# Patient Record
Sex: Female | Born: 1940 | Race: White | Hispanic: No | Marital: Single | Smoking: Former smoker
Health system: Southern US, Community
[De-identification: ages and names within clinical notes are randomized; demographics above are authoritative.]

## PROBLEM LIST (undated history)

## (undated) DIAGNOSIS — I10 Essential (primary) hypertension: Secondary | ICD-10-CM

## (undated) DIAGNOSIS — I2510 Atherosclerotic heart disease of native coronary artery without angina pectoris: Secondary | ICD-10-CM

## (undated) DIAGNOSIS — E785 Hyperlipidemia, unspecified: Secondary | ICD-10-CM

## (undated) DIAGNOSIS — J449 Chronic obstructive pulmonary disease, unspecified: Secondary | ICD-10-CM

## (undated) DIAGNOSIS — I255 Ischemic cardiomyopathy: Secondary | ICD-10-CM

## (undated) DIAGNOSIS — Z923 Personal history of irradiation: Secondary | ICD-10-CM

## (undated) DIAGNOSIS — C50919 Malignant neoplasm of unspecified site of unspecified female breast: Secondary | ICD-10-CM

## (undated) HISTORY — PX: CATARACT EXTRACTION: SUR2

## (undated) HISTORY — PX: KNEE SURGERY: SHX244

## (undated) HISTORY — DX: Atherosclerotic heart disease of native coronary artery without angina pectoris: I25.10

## (undated) HISTORY — PX: APPENDECTOMY: SHX54

## (undated) HISTORY — DX: Hyperlipidemia, unspecified: E78.5

## (undated) HISTORY — DX: Ischemic cardiomyopathy: I25.5

---

## 1998-03-05 HISTORY — PX: BREAST SURGERY: SHX581

## 2001-04-16 ENCOUNTER — Ambulatory Visit (HOSPITAL_COMMUNITY): Admission: RE | Admit: 2001-04-16 | Discharge: 2001-04-16 | Payer: Self-pay | Admitting: Internal Medicine

## 2001-04-16 ENCOUNTER — Encounter: Payer: Self-pay | Admitting: Internal Medicine

## 2001-04-23 ENCOUNTER — Encounter: Payer: Self-pay | Admitting: Internal Medicine

## 2001-04-23 ENCOUNTER — Ambulatory Visit (HOSPITAL_COMMUNITY): Admission: RE | Admit: 2001-04-23 | Discharge: 2001-04-23 | Payer: Self-pay | Admitting: Internal Medicine

## 2002-11-16 ENCOUNTER — Encounter: Payer: Self-pay | Admitting: Family Medicine

## 2002-11-16 ENCOUNTER — Ambulatory Visit (HOSPITAL_COMMUNITY): Admission: RE | Admit: 2002-11-16 | Discharge: 2002-11-16 | Payer: Self-pay | Admitting: Family Medicine

## 2003-02-24 ENCOUNTER — Ambulatory Visit (HOSPITAL_COMMUNITY): Admission: RE | Admit: 2003-02-24 | Discharge: 2003-02-24 | Payer: Self-pay | Admitting: Internal Medicine

## 2003-12-14 ENCOUNTER — Ambulatory Visit (HOSPITAL_COMMUNITY): Admission: RE | Admit: 2003-12-14 | Discharge: 2003-12-14 | Payer: Self-pay | Admitting: Family Medicine

## 2003-12-22 ENCOUNTER — Ambulatory Visit (HOSPITAL_COMMUNITY): Admission: RE | Admit: 2003-12-22 | Discharge: 2003-12-22 | Payer: Self-pay | Admitting: Family Medicine

## 2004-03-08 ENCOUNTER — Ambulatory Visit (HOSPITAL_COMMUNITY): Admission: RE | Admit: 2004-03-08 | Discharge: 2004-03-08 | Payer: Self-pay | Admitting: Family Medicine

## 2004-11-03 ENCOUNTER — Ambulatory Visit (HOSPITAL_COMMUNITY): Admission: RE | Admit: 2004-11-03 | Discharge: 2004-11-03 | Payer: Self-pay | Admitting: Otolaryngology

## 2005-05-07 ENCOUNTER — Ambulatory Visit (HOSPITAL_COMMUNITY): Admission: RE | Admit: 2005-05-07 | Discharge: 2005-05-07 | Payer: Self-pay | Admitting: Family Medicine

## 2006-05-13 ENCOUNTER — Ambulatory Visit (HOSPITAL_COMMUNITY): Admission: RE | Admit: 2006-05-13 | Discharge: 2006-05-13 | Payer: Self-pay | Admitting: Family Medicine

## 2007-08-13 ENCOUNTER — Ambulatory Visit (HOSPITAL_COMMUNITY): Admission: RE | Admit: 2007-08-13 | Discharge: 2007-08-13 | Payer: Self-pay | Admitting: Family Medicine

## 2007-08-18 ENCOUNTER — Ambulatory Visit (HOSPITAL_COMMUNITY): Admission: RE | Admit: 2007-08-18 | Discharge: 2007-08-18 | Payer: Self-pay | Admitting: Family Medicine

## 2007-08-26 ENCOUNTER — Ambulatory Visit (HOSPITAL_COMMUNITY): Admission: RE | Admit: 2007-08-26 | Discharge: 2007-08-26 | Payer: Self-pay | Admitting: Family Medicine

## 2009-09-07 ENCOUNTER — Ambulatory Visit (HOSPITAL_COMMUNITY): Admission: RE | Admit: 2009-09-07 | Discharge: 2009-09-07 | Payer: Self-pay | Admitting: Family Medicine

## 2009-10-19 ENCOUNTER — Ambulatory Visit (HOSPITAL_COMMUNITY): Admission: RE | Admit: 2009-10-19 | Discharge: 2009-10-19 | Payer: Self-pay | Admitting: Family Medicine

## 2010-03-25 ENCOUNTER — Encounter: Payer: Self-pay | Admitting: Internal Medicine

## 2010-08-23 ENCOUNTER — Other Ambulatory Visit (HOSPITAL_COMMUNITY): Payer: Self-pay | Admitting: Family Medicine

## 2010-08-23 DIAGNOSIS — Z1231 Encounter for screening mammogram for malignant neoplasm of breast: Secondary | ICD-10-CM

## 2010-09-11 ENCOUNTER — Ambulatory Visit (HOSPITAL_COMMUNITY)
Admission: RE | Admit: 2010-09-11 | Discharge: 2010-09-11 | Disposition: A | Discharge status: Home / Self Care | Payer: Medicare Other | Source: Ambulatory Visit | Attending: Family Medicine | Admitting: Family Medicine

## 2010-09-11 DIAGNOSIS — Z1231 Encounter for screening mammogram for malignant neoplasm of breast: Secondary | ICD-10-CM

## 2011-08-31 ENCOUNTER — Other Ambulatory Visit (HOSPITAL_COMMUNITY): Payer: Self-pay | Admitting: Family Medicine

## 2011-08-31 DIAGNOSIS — Z1231 Encounter for screening mammogram for malignant neoplasm of breast: Secondary | ICD-10-CM

## 2011-10-02 ENCOUNTER — Ambulatory Visit (HOSPITAL_COMMUNITY): Payer: Medicare Other

## 2011-10-02 ENCOUNTER — Ambulatory Visit (HOSPITAL_COMMUNITY)
Admission: RE | Admit: 2011-10-02 | Discharge: 2011-10-02 | Disposition: A | Discharge status: Home / Self Care | Payer: Medicare Other | Source: Ambulatory Visit | Attending: Family Medicine | Admitting: Family Medicine

## 2011-10-02 DIAGNOSIS — Z1231 Encounter for screening mammogram for malignant neoplasm of breast: Secondary | ICD-10-CM | POA: Insufficient documentation

## 2012-08-29 ENCOUNTER — Other Ambulatory Visit (HOSPITAL_COMMUNITY): Payer: Self-pay | Admitting: Family Medicine

## 2012-08-29 DIAGNOSIS — Z1231 Encounter for screening mammogram for malignant neoplasm of breast: Secondary | ICD-10-CM

## 2012-10-02 ENCOUNTER — Ambulatory Visit (HOSPITAL_COMMUNITY)
Admission: RE | Admit: 2012-10-02 | Discharge: 2012-10-02 | Disposition: A | Discharge status: Home / Self Care | Payer: Medicare Other | Source: Ambulatory Visit | Attending: Family Medicine | Admitting: Family Medicine

## 2012-10-02 DIAGNOSIS — Z1231 Encounter for screening mammogram for malignant neoplasm of breast: Secondary | ICD-10-CM | POA: Insufficient documentation

## 2013-08-28 ENCOUNTER — Other Ambulatory Visit (HOSPITAL_COMMUNITY): Payer: Self-pay | Admitting: Family Medicine

## 2013-08-28 DIAGNOSIS — Z1231 Encounter for screening mammogram for malignant neoplasm of breast: Secondary | ICD-10-CM

## 2013-10-05 ENCOUNTER — Ambulatory Visit (HOSPITAL_COMMUNITY)
Admission: RE | Admit: 2013-10-05 | Discharge: 2013-10-05 | Disposition: A | Discharge status: Home / Self Care | Payer: Medicare HMO | Source: Ambulatory Visit | Attending: Family Medicine | Admitting: Family Medicine

## 2013-10-05 DIAGNOSIS — Z1231 Encounter for screening mammogram for malignant neoplasm of breast: Secondary | ICD-10-CM | POA: Diagnosis present

## 2014-09-07 ENCOUNTER — Other Ambulatory Visit (HOSPITAL_COMMUNITY): Payer: Self-pay | Admitting: Family Medicine

## 2014-09-07 DIAGNOSIS — Z1231 Encounter for screening mammogram for malignant neoplasm of breast: Secondary | ICD-10-CM

## 2014-10-11 ENCOUNTER — Ambulatory Visit (HOSPITAL_COMMUNITY)
Admission: RE | Admit: 2014-10-11 | Discharge: 2014-10-11 | Disposition: A | Discharge status: Home / Self Care | Payer: Medicare PPO | Source: Ambulatory Visit | Attending: Family Medicine | Admitting: Family Medicine

## 2014-10-11 DIAGNOSIS — Z1231 Encounter for screening mammogram for malignant neoplasm of breast: Secondary | ICD-10-CM | POA: Diagnosis present

## 2015-09-14 ENCOUNTER — Other Ambulatory Visit: Payer: Self-pay | Admitting: Family Medicine

## 2015-09-14 DIAGNOSIS — Z1231 Encounter for screening mammogram for malignant neoplasm of breast: Secondary | ICD-10-CM

## 2015-10-13 ENCOUNTER — Ambulatory Visit
Admission: RE | Admit: 2015-10-13 | Discharge: 2015-10-13 | Disposition: A | Discharge status: Home / Self Care | Payer: Medicare PPO | Source: Ambulatory Visit | Attending: Family Medicine | Admitting: Family Medicine

## 2015-10-13 DIAGNOSIS — Z1231 Encounter for screening mammogram for malignant neoplasm of breast: Secondary | ICD-10-CM

## 2016-02-28 ENCOUNTER — Emergency Department (HOSPITAL_COMMUNITY): Payer: Medicare PPO

## 2016-02-28 ENCOUNTER — Encounter (HOSPITAL_COMMUNITY): Payer: Self-pay | Admitting: Emergency Medicine

## 2016-02-28 ENCOUNTER — Inpatient Hospital Stay (HOSPITAL_COMMUNITY)
Admission: EM | Admit: 2016-02-28 | Discharge: 2016-03-05 | DRG: 280 | Disposition: A | Discharge status: Home Health | Payer: Medicare PPO | Attending: Internal Medicine | Admitting: Internal Medicine

## 2016-02-28 DIAGNOSIS — I214 Non-ST elevation (NSTEMI) myocardial infarction: Secondary | ICD-10-CM | POA: Diagnosis present

## 2016-02-28 DIAGNOSIS — I248 Other forms of acute ischemic heart disease: Secondary | ICD-10-CM | POA: Diagnosis present

## 2016-02-28 DIAGNOSIS — I2510 Atherosclerotic heart disease of native coronary artery without angina pectoris: Secondary | ICD-10-CM

## 2016-02-28 DIAGNOSIS — J449 Chronic obstructive pulmonary disease, unspecified: Secondary | ICD-10-CM | POA: Diagnosis present

## 2016-02-28 DIAGNOSIS — I255 Ischemic cardiomyopathy: Secondary | ICD-10-CM | POA: Diagnosis present

## 2016-02-28 DIAGNOSIS — I252 Old myocardial infarction: Secondary | ICD-10-CM

## 2016-02-28 DIAGNOSIS — I5021 Acute systolic (congestive) heart failure: Secondary | ICD-10-CM | POA: Diagnosis not present

## 2016-02-28 DIAGNOSIS — R0902 Hypoxemia: Secondary | ICD-10-CM | POA: Diagnosis present

## 2016-02-28 DIAGNOSIS — R739 Hyperglycemia, unspecified: Secondary | ICD-10-CM | POA: Diagnosis present

## 2016-02-28 DIAGNOSIS — R748 Abnormal levels of other serum enzymes: Secondary | ICD-10-CM | POA: Diagnosis present

## 2016-02-28 DIAGNOSIS — I110 Hypertensive heart disease with heart failure: Secondary | ICD-10-CM | POA: Diagnosis present

## 2016-02-28 DIAGNOSIS — E7800 Pure hypercholesterolemia, unspecified: Secondary | ICD-10-CM | POA: Diagnosis present

## 2016-02-28 DIAGNOSIS — Z7902 Long term (current) use of antithrombotics/antiplatelets: Secondary | ICD-10-CM

## 2016-02-28 DIAGNOSIS — Z79899 Other long term (current) drug therapy: Secondary | ICD-10-CM | POA: Diagnosis not present

## 2016-02-28 DIAGNOSIS — R0602 Shortness of breath: Secondary | ICD-10-CM

## 2016-02-28 DIAGNOSIS — Z853 Personal history of malignant neoplasm of breast: Secondary | ICD-10-CM

## 2016-02-28 DIAGNOSIS — I5023 Acute on chronic systolic (congestive) heart failure: Secondary | ICD-10-CM | POA: Diagnosis present

## 2016-02-28 DIAGNOSIS — I10 Essential (primary) hypertension: Secondary | ICD-10-CM | POA: Diagnosis not present

## 2016-02-28 DIAGNOSIS — R778 Other specified abnormalities of plasma proteins: Secondary | ICD-10-CM

## 2016-02-28 DIAGNOSIS — R9439 Abnormal result of other cardiovascular function study: Secondary | ICD-10-CM | POA: Diagnosis not present

## 2016-02-28 DIAGNOSIS — Z7982 Long term (current) use of aspirin: Secondary | ICD-10-CM

## 2016-02-28 DIAGNOSIS — Z87891 Personal history of nicotine dependence: Secondary | ICD-10-CM

## 2016-02-28 DIAGNOSIS — Z8249 Family history of ischemic heart disease and other diseases of the circulatory system: Secondary | ICD-10-CM | POA: Diagnosis not present

## 2016-02-28 DIAGNOSIS — Z882 Allergy status to sulfonamides status: Secondary | ICD-10-CM

## 2016-02-28 DIAGNOSIS — J441 Chronic obstructive pulmonary disease with (acute) exacerbation: Secondary | ICD-10-CM

## 2016-02-28 DIAGNOSIS — R0600 Dyspnea, unspecified: Secondary | ICD-10-CM | POA: Diagnosis not present

## 2016-02-28 DIAGNOSIS — R7989 Other specified abnormal findings of blood chemistry: Secondary | ICD-10-CM

## 2016-02-28 HISTORY — DX: Chronic obstructive pulmonary disease, unspecified: J44.9

## 2016-02-28 HISTORY — DX: Atherosclerotic heart disease of native coronary artery without angina pectoris: I25.10

## 2016-02-28 HISTORY — DX: Malignant neoplasm of unspecified site of unspecified female breast: C50.919

## 2016-02-28 HISTORY — DX: Essential (primary) hypertension: I10

## 2016-02-28 LAB — CBC
HCT: 40 % (ref 36.0–46.0)
Hemoglobin: 13.4 g/dL (ref 12.0–15.0)
MCH: 33.1 pg (ref 26.0–34.0)
MCHC: 33.5 g/dL (ref 30.0–36.0)
MCV: 98.8 fL (ref 78.0–100.0)
PLATELETS: 177 10*3/uL (ref 150–400)
RBC: 4.05 MIL/uL (ref 3.87–5.11)
RDW: 14.3 % (ref 11.5–15.5)
WBC: 9.1 10*3/uL (ref 4.0–10.5)

## 2016-02-28 LAB — BASIC METABOLIC PANEL
Anion gap: 10 (ref 5–15)
BUN: 16 mg/dL (ref 6–20)
CALCIUM: 9.6 mg/dL (ref 8.9–10.3)
CO2: 22 mmol/L (ref 22–32)
CREATININE: 0.85 mg/dL (ref 0.44–1.00)
Chloride: 102 mmol/L (ref 101–111)
GLUCOSE: 119 mg/dL — AB (ref 65–99)
Potassium: 4.4 mmol/L (ref 3.5–5.1)
Sodium: 134 mmol/L — ABNORMAL LOW (ref 135–145)

## 2016-02-28 LAB — APTT: aPTT: 88 seconds — ABNORMAL HIGH (ref 24–36)

## 2016-02-28 LAB — TROPONIN I
TROPONIN I: 0.05 ng/mL — AB (ref <0.03)
Troponin I: 0.1 ng/mL (ref <0.03)
Troponin I: 0.14 ng/mL (ref <0.03)

## 2016-02-28 LAB — BRAIN NATRIURETIC PEPTIDE: B Natriuretic Peptide: 797 pg/mL — ABNORMAL HIGH (ref 0.0–100.0)

## 2016-02-28 MED ORDER — SODIUM CHLORIDE 0.9% FLUSH
3.0000 mL | Freq: Two times a day (BID) | INTRAVENOUS | Status: DC
  Administered 2016-02-29 – 2016-03-02 (×3): 3 mL via INTRAVENOUS

## 2016-02-28 MED ORDER — ALBUTEROL SULFATE (2.5 MG/3ML) 0.083% IN NEBU
2.5000 mg | INHALATION_SOLUTION | Freq: Four times a day (QID) | RESPIRATORY_TRACT | Status: DC
  Administered 2016-02-29 (×2): 2.5 mg via RESPIRATORY_TRACT
  Filled 2016-02-28 (×2): qty 3

## 2016-02-28 MED ORDER — HEPARIN SODIUM (PORCINE) 5000 UNIT/ML IJ SOLN
4000.0000 [IU] | Freq: Once | INTRAMUSCULAR | Status: AC
  Administered 2016-02-28: 4000 [IU] via INTRAVENOUS

## 2016-02-28 MED ORDER — TIOTROPIUM BROMIDE MONOHYDRATE 18 MCG IN CAPS
18.0000 ug | ORAL_CAPSULE | Freq: Every day | RESPIRATORY_TRACT | Status: DC
  Administered 2016-03-02 – 2016-03-05 (×4): 18 ug via RESPIRATORY_TRACT
  Filled 2016-02-28 (×2): qty 5

## 2016-02-28 MED ORDER — HYDRALAZINE HCL 20 MG/ML IJ SOLN: 5.0000 mg | Freq: Four times a day (QID) | INTRAMUSCULAR | Status: DC | PRN

## 2016-02-28 MED ORDER — HEPARIN (PORCINE) IN NACL 100-0.45 UNIT/ML-% IJ SOLN: 14.0000 [IU]/kg/h | Freq: Once | INTRAMUSCULAR | Status: DC

## 2016-02-28 MED ORDER — SODIUM CHLORIDE 0.9% FLUSH
3.0000 mL | Freq: Two times a day (BID) | INTRAVENOUS | Status: DC
  Administered 2016-02-29 – 2016-03-01 (×3): 3 mL via INTRAVENOUS

## 2016-02-28 MED ORDER — SODIUM CHLORIDE 0.9 % IV SOLN: 250.0000 mL | INTRAVENOUS | Status: DC | PRN

## 2016-02-28 MED ORDER — METHYLPREDNISOLONE SODIUM SUCC 125 MG IJ SOLR
125.0000 mg | Freq: Once | INTRAMUSCULAR | Status: AC
  Administered 2016-02-28: 125 mg via INTRAVENOUS
  Filled 2016-02-28: qty 2

## 2016-02-28 MED ORDER — ALBUTEROL SULFATE (2.5 MG/3ML) 0.083% IN NEBU: 2.5000 mg | INHALATION_SOLUTION | Freq: Four times a day (QID) | RESPIRATORY_TRACT | Status: DC | PRN

## 2016-02-28 MED ORDER — METHYLPREDNISOLONE SODIUM SUCC 125 MG IJ SOLR
80.0000 mg | Freq: Three times a day (TID) | INTRAMUSCULAR | Status: DC
  Administered 2016-02-29 – 2016-03-01 (×4): 80 mg via INTRAVENOUS
  Filled 2016-02-28 (×4): qty 2

## 2016-02-28 MED ORDER — FLUTICASONE FUROATE-VILANTEROL 200-25 MCG/INH IN AEPB
1.0000 | INHALATION_SPRAY | Freq: Every day | RESPIRATORY_TRACT | Status: DC
  Administered 2016-03-02 – 2016-03-05 (×4): 1 via RESPIRATORY_TRACT
  Filled 2016-02-28: qty 28

## 2016-02-28 MED ORDER — SODIUM CHLORIDE 0.9% FLUSH: 3.0000 mL | INTRAVENOUS | Status: DC | PRN

## 2016-02-28 MED ORDER — AMLODIPINE BESYLATE 10 MG PO TABS
10.0000 mg | ORAL_TABLET | Freq: Every day | ORAL | Status: DC
  Administered 2016-02-29 – 2016-03-02 (×3): 10 mg via ORAL
  Filled 2016-02-28 (×3): qty 1

## 2016-02-28 MED ORDER — ATORVASTATIN CALCIUM 80 MG PO TABS
80.0000 mg | ORAL_TABLET | Freq: Every day | ORAL | Status: DC
  Administered 2016-02-29 – 2016-03-02 (×2): 80 mg via ORAL
  Filled 2016-02-28 (×4): qty 1

## 2016-02-28 MED ORDER — ACETAMINOPHEN 325 MG PO TABS: 650.0000 mg | ORAL_TABLET | Freq: Four times a day (QID) | ORAL | Status: DC | PRN

## 2016-02-28 MED ORDER — IPRATROPIUM-ALBUTEROL 0.5-2.5 (3) MG/3ML IN SOLN
3.0000 mL | Freq: Once | RESPIRATORY_TRACT | Status: AC
  Administered 2016-02-28: 3 mL via RESPIRATORY_TRACT
  Filled 2016-02-28: qty 3

## 2016-02-28 MED ORDER — HEPARIN (PORCINE) IN NACL 100-0.45 UNIT/ML-% IJ SOLN
1050.0000 [IU]/h | INTRAMUSCULAR | Status: AC
  Administered 2016-02-28: 1050 [IU]/h via INTRAVENOUS
  Filled 2016-02-28: qty 250

## 2016-02-28 MED ORDER — LEVOFLOXACIN IN D5W 500 MG/100ML IV SOLN
500.0000 mg | Freq: Every day | INTRAVENOUS | Status: DC
  Administered 2016-02-29 (×2): 500 mg via INTRAVENOUS
  Filled 2016-02-28 (×2): qty 100

## 2016-02-28 MED ORDER — ASPIRIN EC 325 MG PO TBEC
325.0000 mg | DELAYED_RELEASE_TABLET | Freq: Every day | ORAL | Status: DC
  Administered 2016-02-29: 325 mg via ORAL
  Filled 2016-02-28: qty 1

## 2016-02-28 MED ORDER — IOPAMIDOL (ISOVUE-370) INJECTION 76%
100.0000 mL | Freq: Once | INTRAVENOUS | Status: AC | PRN
  Administered 2016-02-28: 100 mL via INTRAVENOUS

## 2016-02-28 MED ORDER — ACETAMINOPHEN 650 MG RE SUPP: 650.0000 mg | Freq: Four times a day (QID) | RECTAL | Status: DC | PRN

## 2016-02-28 MED ORDER — FUROSEMIDE 10 MG/ML IJ SOLN
40.0000 mg | Freq: Once | INTRAMUSCULAR | Status: AC
  Administered 2016-02-28: 40 mg via INTRAVENOUS
  Filled 2016-02-28: qty 4
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
## 2016-02-28 NOTE — ED Notes (Signed)
Pt taken to Xray.

## 2016-02-28 NOTE — ED Notes (Signed)
CRITICAL VALUE ALERT  Critical value received:  Troponin 0.05  Date of notification:  02/28/16  Time of notification:  1330  Critical value read back:Yes.    Nurse who received alert:  RMinter, RN  MD notified (1st page):  Dr. Alvino Chapel  Time of first page:  1330  MD notified (2nd page):  Time of second page:  Responding MD:  Dr. Alvino Chapel  Time MD responded:  1330
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
## 2016-02-28 NOTE — ED Provider Notes (Signed)
Patient turned over to me from daytime emergency physician Dr. Alvino Chapel. Patient ran into further respiratory distress. Was having trouble with breathing was on 100% nonrebreather patient was maintaining her sats but she had bilateral wheezing. Chest x-ray raises some concerns for pulmonary edema CT angiogram of the chest negative for pulmonary embolus. Has some pleural effusions but no significant edema. Patient BNP slightly elevated. Patient's initial troponin abnormal at 0.05. Repeat troponin was higher at 0.1. Discussed with cardiology was concerned for possible STEMI they said unless it arises above 0.1 may not be but agreed with starting heparin. Patient started on BiPAP for the respiratory distress received nebulizer gets IM Medrol improvement in the breathing. Patient also given IV Lasix 40 mg. Discussed with hospitalist. Patient will be transferred to hospital service a cone. This may just be an exacerbation of COPD. Or possibly a non-STEMI. Patient BNP was elevated [place] 797 nothing to compare it to.  CRITICAL CARE Performed by: Fredia Sorrow Total critical care time: 45 minutes Critical care time was exclusive of separately billable procedures and treating other patients. Critical care was necessary to treat or prevent imminent or life-threatening deterioration. Critical care was time spent personally by me on the following activities: development of treatment plan with patient and/or surrogate as well as nursing, discussions with consultants, evaluation of patient's response to treatment, examination of patient, obtaining history from patient or surrogate, ordering and performing treatments and interventions, ordering and review of laboratory studies, ordering and review of radiographic studies, pulse oximetry and re-evaluation of patient's condition.    Fredia Sorrow, MD 02/28/16 [phone]

## 2016-02-28 NOTE — Progress Notes (Signed)
ANTICOAGULATION CONSULT NOTE - Initial Consult  Pharmacy Consult for heparin Indication: chest pain/ACS  Allergies  Allergen Reactions  . Sulfa Antibiotics Swelling    Patient Measurements: Height: 5\' 10"  (177.8 cm) Weight: 166 lb (75.3 kg) IBW/kg (Calculated) : 68.5 Heparin Dosing Weight: 75.3 kg  Vital Signs: Temp: 98.1 F (36.7 C) (12/26 1225) Temp Source: Oral (12/26 1225) BP: 150/76 (12/26 1800) Pulse Rate: 92 (12/26 1800)  Labs:  Recent Labs  02/28/16 1243 02/28/16 1247 02/28/16 1543  HGB 13.4  --   --   HCT 40.0  --   --   PLT 177  --   --   CREATININE 0.85  --   --   TROPONINI  --  0.05* 0.10*    Estimated Creatinine Clearance: 62.8 mL/min (by C-G formula based on SCr of 0.85 mg/dL).   Medical History: Past Medical History:  Diagnosis Date  . COPD (chronic obstructive pulmonary disease) ([place])   . Hypercholesteremia   . Hypertension     Medications:  See medication history  Assessment: 75 yo lady to start heparin for CP, r/o ACS.  She was not on anticoagulation PTA.  Heparin bolus 4000 units and drip at 1050 units/hr ordered in the ED Goal of Therapy:  Heparin level 0.3-0.7 units/ml Monitor platelets by anticoagulation protocol: Yes   Plan:  Continue heparin drip at 1050 units/hr Check heparin level and CBC 6 hours after start and daily while on heparin Monitor for bleeding complications  Excell Seltzer Poteet 02/28/2016,6:04 PM

## 2016-02-29 ENCOUNTER — Other Ambulatory Visit (HOSPITAL_COMMUNITY): Payer: Medicare PPO

## 2016-02-29 DIAGNOSIS — R748 Abnormal levels of other serum enzymes: Secondary | ICD-10-CM

## 2016-02-29 LAB — COMPREHENSIVE METABOLIC PANEL
ALBUMIN: 3.5 g/dL (ref 3.5–5.0)
ALT: 26 U/L (ref 14–54)
AST: 30 U/L (ref 15–41)
Alkaline Phosphatase: 55 U/L (ref 38–126)
Anion gap: 15 (ref 5–15)
BUN: 13 mg/dL (ref 6–20)
CHLORIDE: 103 mmol/L (ref 101–111)
CO2: 20 mmol/L — AB (ref 22–32)
CREATININE: 1 mg/dL (ref 0.44–1.00)
Calcium: 9.6 mg/dL (ref 8.9–10.3)
GFR calc Af Amer: >60 mL/min (ref >60)
GFR, EST NON AFRICAN AMERICAN: 54 mL/min — AB (ref >60)
GLUCOSE: 165 mg/dL — AB (ref 65–99)
POTASSIUM: 3.7 mmol/L (ref 3.5–5.1)
Sodium: 138 mmol/L (ref 135–145)
Total Bilirubin: 0.9 mg/dL (ref 0.3–1.2)
Total Protein: 7.3 g/dL (ref 6.5–8.1)

## 2016-02-29 LAB — MRSA PCR SCREENING: MRSA by PCR: NEGATIVE

## 2016-02-29 LAB — TROPONIN I
TROPONIN I: 0.07 ng/mL — AB (ref <0.03)
Troponin I: 0.1 ng/mL (ref <0.03)
Troponin I: 0.06 ng/mL (ref <0.03)

## 2016-02-29 LAB — CBC
HEMATOCRIT: 36.1 % (ref 36.0–46.0)
Hemoglobin: 12.1 g/dL (ref 12.0–15.0)
MCH: 32 pg (ref 26.0–34.0)
MCHC: 33.5 g/dL (ref 30.0–36.0)
MCV: 95.5 fL (ref 78.0–100.0)
PLATELETS: 186 10*3/uL (ref 150–400)
RBC: 3.78 MIL/uL — ABNORMAL LOW (ref 3.87–5.11)
RDW: 14.6 % (ref 11.5–15.5)
WBC: 8.2 10*3/uL (ref 4.0–10.5)

## 2016-02-29 LAB — TSH: TSH: 1.138 u[IU]/mL (ref 0.350–4.500)

## 2016-02-29 LAB — HEPARIN LEVEL (UNFRACTIONATED)
HEPARIN UNFRACTIONATED: 0.35 [IU]/mL (ref 0.30–0.70)
Heparin Unfractionated: 0.35 IU/mL (ref 0.30–0.70)

## 2016-02-29 MED ORDER — HEPARIN (PORCINE) IN NACL 100-0.45 UNIT/ML-% IJ SOLN
1050.0000 [IU]/h | INTRAMUSCULAR | Status: DC
Start: 2016-02-29 — End: 2016-02-29

## 2016-02-29 MED ORDER — HEPARIN (PORCINE) IN NACL 100-0.45 UNIT/ML-% IJ SOLN: 1050.0000 [IU]/h | INTRAMUSCULAR | Status: DC

## 2016-02-29 NOTE — Plan of Care (Signed)
Problem: Phase I Progression Outcomes Goal: O2 sats > or equal 90% or at baseline Outcome: Completed/Met Date Met: 02/29/16 Bipap removed per RT pt O2 sats high 90's on RA   
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
## 2016-02-29 NOTE — Plan of Care (Signed)
Problem: Phase I Progression Outcomes Goal: Discharge plan established Outcome: Completed/Met Date Met: 02/29/16 Pt independent prior to admit and plans to discharge back to home

## 2016-02-29 NOTE — Progress Notes (Signed)
ANTICOAGULATION CONSULT NOTE - Follow-up Consult  Pharmacy Consult for heparin Indication: chest pain/ACS  Allergies  Allergen Reactions  . Sulfa Antibiotics Swelling    Patient Measurements: Height: 5\' 10"  (177.8 cm) Weight: 169 lb 3.2 oz (76.7 kg) IBW/kg (Calculated) : 68.5 Heparin Dosing Weight: 75.3 kg  Vital Signs: Temp: 98 F (36.7 C) (12/27 1100) Temp Source: Oral (12/27 1100) BP: 123/72 (12/27 1100) Pulse Rate: 93 (12/27 1100)  Labs:  Recent Labs  02/28/16 1243  02/28/16 1943 02/28/16 2319 02/29/16 0501 02/29/16 0523 02/29/16 1301  HGB 13.4  --   --   --  12.1  --   --   HCT 40.0  --   --   --  36.1  --   --   PLT 177  --   --   --  186  --   --   APTT  --   --  88*  --   --   --   --   HEPARINUNFRC  --   --   --   --   --  0.35 0.35  CREATININE 0.85  --   --   --  1.00  --   --   TROPONINI  --   < > 0.14* 0.10* 0.07*  --  0.06*  < > = values in this interval not displayed.  Estimated Creatinine Clearance: 53.4 mL/min (by C-G formula based on SCr of 1 mg/dL).  Assessment: 75 yo F to on heparin for r/o ACS. Heparin level remains therapeutic (0.35) at 1050 units/hr.  Goal of Therapy:  Heparin level 0.3-0.7 units/ml Monitor platelets by anticoagulation protocol: Yes   Plan:  Continue heparin drip at 1050 units/hr Daily heparin level and CBC  Hildred Laser, Pharm D 02/29/2016 2:57 PM

## 2016-02-29 NOTE — Progress Notes (Signed)
Spoke with Kelly Benjamin in pharmacy regarding pt being transferred from [facility] and on Heparin drip. Heparin to be dosed by pharmacy. Jessie Foot, RN

## 2016-02-29 NOTE — Progress Notes (Signed)
Pt removed from bipap at this time and being monitored per RT and RN.  No O2 added at this tiime.  Sats 99% on RA amd BS are clear/diminished.  Rt will continue to monitor.
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
## 2016-02-29 NOTE — Progress Notes (Signed)
Pt remains off Bipap on room air. Tolerating well/ O2 sats mid to high 90's, no SOB or distress noted. Pt currently resting quietly. Will continue to monitor.  Jessie Foot, RN

## 2016-02-29 NOTE — Progress Notes (Signed)
ANTICOAGULATION CONSULT NOTE - Follow-up Consult  Pharmacy Consult for heparin Indication: chest pain/ACS  Allergies  Allergen Reactions  . Sulfa Antibiotics Swelling    Patient Measurements: Height: 5\' 10"  (177.8 cm) Weight: 169 lb 3.2 oz (76.7 kg) IBW/kg (Calculated) : 68.5 Heparin Dosing Weight: 75.3 kg  Vital Signs: Temp: 98.7 F (37.1 C) (12/27 0452) Temp Source: Oral (12/27 0452) BP: 131/78 (12/27 0452) Pulse Rate: 85 (12/27 0452)  Labs:  Recent Labs  02/28/16 1243  02/28/16 1943 02/28/16 2319 02/29/16 0501 02/29/16 0523  HGB 13.4  --   --   --  12.1  --   HCT 40.0  --   --   --  36.1  --   PLT 177  --   --   --  186  --   APTT  --   --  88*  --   --   --   HEPARINUNFRC  --   --   --   --   --  0.35  CREATININE 0.85  --   --   --  1.00  --   TROPONINI  --   < > 0.14* 0.10* 0.07*  --   < > = values in this interval not displayed.  Estimated Creatinine Clearance: 53.4 mL/min (by C-G formula based on SCr of 1 mg/dL).  Assessment: 75 yo F to on heparin for r/o ACS. Heparin level therapeutic (0.35) on gtt at 1050 units/hr. No bleeding noted. CBC stable.  Goal of Therapy:  Heparin level 0.3-0.7 units/ml Monitor platelets by anticoagulation protocol: Yes   Plan:  Continue heparin drip at 1050 units/hr Will f/u 6hr confirmatory heparin level  Sherlon Handing, PharmD, BCPS Clinical pharmacist, pager [phone] 02/29/2016,7:00 AM

## 2016-03-01 ENCOUNTER — Inpatient Hospital Stay (HOSPITAL_COMMUNITY): Payer: Medicare PPO

## 2016-03-01 DIAGNOSIS — I214 Non-ST elevation (NSTEMI) myocardial infarction: Principal | ICD-10-CM

## 2016-03-01 DIAGNOSIS — R748 Abnormal levels of other serum enzymes: Secondary | ICD-10-CM

## 2016-03-01 DIAGNOSIS — R9439 Abnormal result of other cardiovascular function study: Secondary | ICD-10-CM

## 2016-03-01 DIAGNOSIS — R0602 Shortness of breath: Secondary | ICD-10-CM

## 2016-03-01 DIAGNOSIS — I255 Ischemic cardiomyopathy: Secondary | ICD-10-CM

## 2016-03-01 DIAGNOSIS — I252 Old myocardial infarction: Secondary | ICD-10-CM

## 2016-03-01 DIAGNOSIS — R0600 Dyspnea, unspecified: Secondary | ICD-10-CM

## 2016-03-01 LAB — BASIC METABOLIC PANEL
Anion gap: 9 (ref 5–15)
BUN: 26 mg/dL — AB (ref 6–20)
CO2: 23 mmol/L (ref 22–32)
CREATININE: 1.04 mg/dL — AB (ref 0.44–1.00)
Calcium: 9.7 mg/dL (ref 8.9–10.3)
Chloride: 102 mmol/L (ref 101–111)
GFR calc Af Amer: 60 mL/min — ABNORMAL LOW (ref >60)
GFR, EST NON AFRICAN AMERICAN: 52 mL/min — AB (ref >60)
GLUCOSE: 153 mg/dL — AB (ref 65–99)
Potassium: 4.2 mmol/L (ref 3.5–5.1)
Sodium: 134 mmol/L — ABNORMAL LOW (ref 135–145)

## 2016-03-01 LAB — ECHOCARDIOGRAM COMPLETE
Height: 70 in
Weight: 2697.6 oz

## 2016-03-01 LAB — NM MYOCAR MULTI W/SPECT W/WALL MOTION / EF
CSEPED: 0 min
CSEPEDS: 0 s
CSEPEW: 1 METS
CSEPHR: 72 %
CSEPPHR: 106 {beats}/min
MPHR: 146 {beats}/min
Rest HR: 84 {beats}/min

## 2016-03-01 LAB — HEMOGLOBIN A1C
HEMOGLOBIN A1C: 5.4 % (ref 4.8–5.6)
Mean Plasma Glucose: 108 mg/dL

## 2016-03-01 LAB — CBC
HCT: 35.5 % — ABNORMAL LOW (ref 36.0–46.0)
Hemoglobin: 11.8 g/dL — ABNORMAL LOW (ref 12.0–15.0)
MCH: 32.2 pg (ref 26.0–34.0)
MCHC: 33.2 g/dL (ref 30.0–36.0)
MCV: 97 fL (ref 78.0–100.0)
Platelets: 184 10*3/uL (ref 150–400)
RBC: 3.66 MIL/uL — AB (ref 3.87–5.11)
RDW: 15 % (ref 11.5–15.5)
WBC: 16.2 10*3/uL — ABNORMAL HIGH (ref 4.0–10.5)

## 2016-03-01 MED ORDER — REGADENOSON 0.4 MG/5ML IV SOLN
INTRAVENOUS | Status: AC
  Administered 2016-03-01: 0.4 mg via INTRAVENOUS
  Filled 2016-03-01: qty 5

## 2016-03-01 MED ORDER — SODIUM CHLORIDE 0.9 % IV SOLN
INTRAVENOUS | Status: DC
  Administered 2016-03-02: 05:00:00 via INTRAVENOUS

## 2016-03-01 MED ORDER — SODIUM CHLORIDE 0.9 % IV SOLN: 250.0000 mL | INTRAVENOUS | Status: DC | PRN

## 2016-03-01 MED ORDER — METHYLPREDNISOLONE SODIUM SUCC 40 MG IJ SOLR
40.0000 mg | Freq: Three times a day (TID) | INTRAMUSCULAR | Status: DC
  Administered 2016-03-01: 40 mg via INTRAVENOUS

## 2016-03-01 MED ORDER — SODIUM CHLORIDE 0.9% FLUSH: 3.0000 mL | INTRAVENOUS | Status: DC | PRN

## 2016-03-01 MED ORDER — TECHNETIUM TC 99M TETROFOSMIN IV KIT
30.0000 | PACK | Freq: Once | INTRAVENOUS | Status: AC | PRN
Start: 2016-03-01 — End: 2016-03-01
  Administered 2016-03-01: 30 via INTRAVENOUS

## 2016-03-01 MED ORDER — LEVOFLOXACIN 500 MG PO TABS
500.0000 mg | ORAL_TABLET | Freq: Every day | ORAL | Status: DC
  Administered 2016-03-01 – 2016-03-04 (×4): 500 mg via ORAL
  Filled 2016-03-01 (×4): qty 1

## 2016-03-01 MED ORDER — HEPARIN SODIUM (PORCINE) 5000 UNIT/ML IJ SOLN
5000.0000 [IU] | Freq: Three times a day (TID) | INTRAMUSCULAR | Status: DC
  Administered 2016-03-01 (×2): 5000 [IU] via SUBCUTANEOUS
  Filled 2016-03-01 (×2): qty 1

## 2016-03-01 MED ORDER — PREDNISONE 20 MG PO TABS
30.0000 mg | ORAL_TABLET | Freq: Every day | ORAL | Status: DC
  Administered 2016-03-02 – 2016-03-05 (×4): 30 mg via ORAL
  Filled 2016-03-01 (×4): qty 1

## 2016-03-01 MED ORDER — SODIUM CHLORIDE 0.9% FLUSH
3.0000 mL | Freq: Two times a day (BID) | INTRAVENOUS | Status: DC
  Administered 2016-03-01: 3 mL via INTRAVENOUS

## 2016-03-01 MED ORDER — TECHNETIUM TC 99M TETROFOSMIN IV KIT
10.0000 | PACK | Freq: Once | INTRAVENOUS | Status: AC | PRN
Start: 2016-03-01 — End: 2016-03-01
  Administered 2016-03-01: 10 via INTRAVENOUS

## 2016-03-01 MED ORDER — ASPIRIN EC 81 MG PO TBEC
81.0000 mg | DELAYED_RELEASE_TABLET | Freq: Every day | ORAL | Status: DC
  Administered 2016-03-01: 81 mg via ORAL
  Filled 2016-03-01: qty 1

## 2016-03-01 MED ORDER — ASPIRIN 81 MG PO CHEW
81.0000 mg | CHEWABLE_TABLET | ORAL | Status: AC
  Administered 2016-03-02: 81 mg via ORAL
  Filled 2016-03-01: qty 1

## 2016-03-01 MED ORDER — METHYLPREDNISOLONE SODIUM SUCC 40 MG IJ SOLR
40.0000 mg | Freq: Three times a day (TID) | INTRAMUSCULAR | Status: DC
  Filled 2016-03-01: qty 1

## 2016-03-01 MED ORDER — METHYLPREDNISOLONE SODIUM SUCC 40 MG IJ SOLR
40.0000 mg | Freq: Three times a day (TID) | INTRAMUSCULAR | Status: AC
  Administered 2016-03-01: 40 mg via INTRAVENOUS
  Filled 2016-03-01: qty 1

## 2016-03-01 MED ORDER — METOPROLOL TARTRATE 25 MG PO TABS
25.0000 mg | ORAL_TABLET | Freq: Two times a day (BID) | ORAL | Status: DC
  Administered 2016-03-01 – 2016-03-03 (×4): 25 mg via ORAL
  Filled 2016-03-01 (×4): qty 1

## 2016-03-01 MED ORDER — REGADENOSON 0.4 MG/5ML IV SOLN
0.4000 mg | Freq: Once | INTRAVENOUS | Status: AC
  Administered 2016-03-01: 0.4 mg via INTRAVENOUS
  Filled 2016-03-01: qty 5

## 2016-03-01 MED ORDER — ISOSORBIDE MONONITRATE ER 30 MG PO TB24
30.0000 mg | ORAL_TABLET | Freq: Every day | ORAL | Status: DC
  Administered 2016-03-01 – 2016-03-05 (×5): 30 mg via ORAL
  Filled 2016-03-01 (×5): qty 1

## 2016-03-01 NOTE — Progress Notes (Signed)
  Echocardiogram 2D Echocardiogram has been performed.  Kelly Benjamin 03/01/2016, 2:00 PM

## 2016-03-01 NOTE — Progress Notes (Signed)
   Kelly Benjamin presented for a 1-day cardiolite today.  No immediate complications.  Stress imaging is pending at this time.  Erma Heritage, PA-C 03/01/2016, 10:18 AM
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
## 2016-03-01 NOTE — Care Management Note (Signed)
Case Management Note  Patient Details  Name: Kelly Benjamin MRN: [id number] Date of Birth: 07-Aug-1940  Subjective/Objective: Pt presented as a transfer from [facility] for COPD Exacerbation. Pt continues on IV Solumedrol.                    Action/Plan: CM will continue to monitor for additional needs.   Expected Discharge Date:                  Expected Discharge Plan:  [place]  In-House Referral:     Discharge planning Services  CM Consult  Post Acute Care Choice:    Choice offered to:     DME Arranged:    DME Agency:     HH Arranged:    [place] Agency:     Status of Service:  In process, will continue to follow  If discussed at Long Length of Stay Meetings, dates discussed:    Additional Comments:  Bethena Roys, RN 03/01/2016, 11:04 AM
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
## 2016-03-02 ENCOUNTER — Encounter (HOSPITAL_COMMUNITY)
Admission: EM | Disposition: A | Discharge status: Home Health | Payer: Self-pay | Source: Home / Self Care | Attending: Internal Medicine

## 2016-03-02 DIAGNOSIS — I2510 Atherosclerotic heart disease of native coronary artery without angina pectoris: Secondary | ICD-10-CM

## 2016-03-02 DIAGNOSIS — J441 Chronic obstructive pulmonary disease with (acute) exacerbation: Secondary | ICD-10-CM

## 2016-03-02 DIAGNOSIS — R0902 Hypoxemia: Secondary | ICD-10-CM

## 2016-03-02 HISTORY — PX: CARDIAC CATHETERIZATION: SHX172

## 2016-03-02 LAB — CBC
HCT: 34.9 % — ABNORMAL LOW (ref 36.0–46.0)
Hemoglobin: 11.3 g/dL — ABNORMAL LOW (ref 12.0–15.0)
MCH: 31.7 pg (ref 26.0–34.0)
MCHC: 32.4 g/dL (ref 30.0–36.0)
MCV: 98 fL (ref 78.0–100.0)
PLATELETS: 173 10*3/uL (ref 150–400)
RBC: 3.56 MIL/uL — AB (ref 3.87–5.11)
RDW: 15 % (ref 11.5–15.5)
WBC: 10.7 10*3/uL — AB (ref 4.0–10.5)

## 2016-03-02 LAB — CREATININE, SERUM
CREATININE: 1.07 mg/dL — AB (ref 0.44–1.00)
GFR, EST AFRICAN AMERICAN: 58 mL/min — AB (ref >60)
GFR, EST NON AFRICAN AMERICAN: 50 mL/min — AB (ref >60)

## 2016-03-02 LAB — PROTIME-INR
INR: 1.06
PROTHROMBIN TIME: 13.9 s (ref 11.4–15.2)

## 2016-03-02 SURGERY — LEFT HEART CATH AND CORONARY ANGIOGRAPHY
Anesthesia: LOCAL

## 2016-03-02 MED ORDER — LIDOCAINE HCL (PF) 1 % IJ SOLN
INTRAMUSCULAR | Status: AC
  Filled 2016-03-02: qty 30

## 2016-03-02 MED ORDER — VERAPAMIL HCL 2.5 MG/ML IV SOLN
INTRAVENOUS | Status: DC | PRN
  Administered 2016-03-02: 17:00:00 via INTRA_ARTERIAL

## 2016-03-02 MED ORDER — SODIUM CHLORIDE 0.9% FLUSH
3.0000 mL | Freq: Two times a day (BID) | INTRAVENOUS | Status: DC
  Administered 2016-03-02 – 2016-03-04 (×5): 3 mL via INTRAVENOUS

## 2016-03-02 MED ORDER — IOPAMIDOL (ISOVUE-370) INJECTION 76%
INTRAVENOUS | Status: AC
  Filled 2016-03-02: qty 100

## 2016-03-02 MED ORDER — SODIUM CHLORIDE 0.9 % IV SOLN: 250.0000 mL | INTRAVENOUS | Status: DC | PRN

## 2016-03-02 MED ORDER — ONDANSETRON HCL 4 MG/2ML IJ SOLN: 4.0000 mg | Freq: Four times a day (QID) | INTRAMUSCULAR | Status: DC | PRN

## 2016-03-02 MED ORDER — VERAPAMIL HCL 2.5 MG/ML IV SOLN
INTRAVENOUS | Status: AC
  Filled 2016-03-02: qty 2

## 2016-03-02 MED ORDER — IOPAMIDOL (ISOVUE-370) INJECTION 76%
INTRAVENOUS | Status: DC | PRN
  Administered 2016-03-02: 75 mL via INTRA_ARTERIAL

## 2016-03-02 MED ORDER — HEPARIN (PORCINE) IN NACL 2-0.9 UNIT/ML-% IJ SOLN
INTRAMUSCULAR | Status: AC
  Filled 2016-03-02: qty 1000

## 2016-03-02 MED ORDER — OXYCODONE-ACETAMINOPHEN 5-325 MG PO TABS: 1.0000 | ORAL_TABLET | ORAL | Status: DC | PRN

## 2016-03-02 MED ORDER — LIDOCAINE HCL (PF) 1 % IJ SOLN
INTRAMUSCULAR | Status: DC | PRN
  Administered 2016-03-02: 2 mL via INTRADERMAL

## 2016-03-02 MED ORDER — SODIUM CHLORIDE 0.9 % IV SOLN: INTRAVENOUS | Status: AC

## 2016-03-02 MED ORDER — HEPARIN (PORCINE) IN NACL 2-0.9 UNIT/ML-% IJ SOLN
INTRAMUSCULAR | Status: DC | PRN
  Administered 2016-03-02: 1500 mL via INTRA_ARTERIAL

## 2016-03-02 MED ORDER — HEPARIN SODIUM (PORCINE) 5000 UNIT/ML IJ SOLN
5000.0000 [IU] | Freq: Three times a day (TID) | INTRAMUSCULAR | Status: DC
  Administered 2016-03-03 – 2016-03-05 (×7): 5000 [IU] via SUBCUTANEOUS
  Filled 2016-03-02 (×7): qty 1

## 2016-03-02 MED ORDER — FENTANYL CITRATE (PF) 100 MCG/2ML IJ SOLN
INTRAMUSCULAR | Status: DC | PRN
  Administered 2016-03-02: 50 ug via INTRAVENOUS

## 2016-03-02 MED ORDER — HEPARIN SODIUM (PORCINE) 1000 UNIT/ML IJ SOLN
INTRAMUSCULAR | Status: DC | PRN
Start: 2016-03-02 — End: 2016-03-02
  Administered 2016-03-02: 3500 [IU] via INTRAVENOUS

## 2016-03-02 MED ORDER — HEPARIN (PORCINE) IN NACL 2-0.9 UNIT/ML-% IJ SOLN
INTRAMUSCULAR | Status: AC
  Filled 2016-03-02: qty 500

## 2016-03-02 MED ORDER — MIDAZOLAM HCL 2 MG/2ML IJ SOLN
INTRAMUSCULAR | Status: AC
  Filled 2016-03-02: qty 2

## 2016-03-02 MED ORDER — MIDAZOLAM HCL 2 MG/2ML IJ SOLN
INTRAMUSCULAR | Status: DC | PRN
  Administered 2016-03-02: 1 mg via INTRAVENOUS

## 2016-03-02 MED ORDER — FENTANYL CITRATE (PF) 100 MCG/2ML IJ SOLN
INTRAMUSCULAR | Status: AC
  Filled 2016-03-02: qty 2

## 2016-03-02 MED ORDER — ASPIRIN 81 MG PO CHEW
81.0000 mg | CHEWABLE_TABLET | Freq: Every day | ORAL | Status: DC
  Administered 2016-03-03 – 2016-03-05 (×3): 81 mg via ORAL
  Filled 2016-03-02 (×3): qty 1

## 2016-03-02 MED ORDER — HEPARIN SODIUM (PORCINE) 1000 UNIT/ML IJ SOLN
INTRAMUSCULAR | Status: AC
  Filled 2016-03-02: qty 1

## 2016-03-02 MED ORDER — SODIUM CHLORIDE 0.9% FLUSH
3.0000 mL | INTRAVENOUS | Status: DC | PRN
Start: 2016-03-02 — End: 2016-03-05

## 2016-03-02 SURGICAL SUPPLY — 11 items
CATH INFINITI 5 FR JL3.5 (CATHETERS) ×2 IMPLANT
CATH INFINITI JR4 5F (CATHETERS) ×2 IMPLANT
DEVICE RAD COMP TR BAND LRG (VASCULAR PRODUCTS) ×2 IMPLANT
GLIDESHEATH SLEND A-KIT 6F 22G (SHEATH) ×2 IMPLANT
GUIDEWIRE INQWIRE 1.5J.035X260 (WIRE) ×1 IMPLANT
INQWIRE 1.5J .035X260CM (WIRE) ×2
KIT HEART LEFT (KITS) ×2 IMPLANT
PACK CARDIAC CATHETERIZATION (CUSTOM PROCEDURE TRAY) ×2 IMPLANT
TRANSDUCER W/STOPCOCK (MISCELLANEOUS) ×2 IMPLANT
TUBING CIL FLEX 10 FLL-RA (TUBING) ×2 IMPLANT
WIRE HI TORQ VERSACORE-J 145CM (WIRE) ×2 IMPLANT

## 2016-03-02 NOTE — H&P (View-Only) (Signed)
Patient Name: Kelly Benjamin Date of Encounter: 03/02/2016  Primary Cardiologist: New, Dr. [facility] Problem List     Active Problems:   COPD exacerbation (HCC)   Elevated troponin   Hyperglycemia   SOB (shortness of breath)   Non-STEMI (non-ST elevated myocardial infarction) (HCC)   Cardiomyopathy, ischemic   Abnormal nuclear stress test     Subjective   Feels well, denies chest pain and SOB.   Inpatient Medications    Scheduled Meds: . amLODipine  10 mg Oral Daily  . aspirin EC  81 mg Oral Daily  . atorvastatin  80 mg Oral q1800  . fluticasone furoate-vilanterol  1 puff Inhalation Daily  . heparin subcutaneous  5,000 Units Subcutaneous Q8H  . isosorbide mononitrate  30 mg Oral Daily  . levofloxacin  500 mg Oral Daily  . metoprolol tartrate  25 mg Oral BID  . predniSONE  30 mg Oral Q breakfast  . sodium chloride flush  3 mL Intravenous Q12H  . sodium chloride flush  3 mL Intravenous Q12H  . sodium chloride flush  3 mL Intravenous Q12H  . tiotropium  18 mcg Inhalation Daily   Continuous Infusions: . sodium chloride 100 mL/hr at 03/02/16 0455   PRN Meds: sodium chloride, sodium chloride, acetaminophen **OR** acetaminophen, albuterol, hydrALAZINE, sodium chloride flush, sodium chloride flush   Vital Signs    Vitals:   03/01/16 2228 03/02/16 0426 03/02/16 0838 03/02/16 0839  BP: 131/81 134/79    Pulse: 80 75    Resp: 20 20    Temp: 97.8 F (36.6 C) 98.9 F (37.2 C)    TempSrc: Oral Oral    SpO2: 96% 94% 93% 93%  Weight:  168 lb 3.2 oz (76.3 kg)    Height:        Intake/Output Summary (Last 24 hours) at 03/02/16 1042 Last data filed at 03/02/16 0500  Gross per 24 hour  Intake          1208.33 ml  Output              450 ml  Net           758.33 ml   Filed Weights   02/29/16 0452 03/01/16 0305 03/02/16 0426  Weight: 169 lb 3.2 oz (76.7 kg) 168 lb 9.6 oz (76.5 kg) 168 lb 3.2 oz (76.3 kg)    Physical Exam   General: Pleasant, NAD Psych:  Normal affect. Neuro: Alert and oriented X 3. Moves all extremities spontaneously. HEENT: Normal           Neck: Supple without bruits or JVD. Lungs:  Resp regular and unlabored, scattered expiratory wheezing.  Heart: RRR no s3, s4, or murmurs. Abdomen: Soft, non-tender, non-distended, BS + x 4.  Extremities: No clubbing, cyanosis or edema. DP/PT/Radials 2+ and equal bilaterally. Labs    CBC  Recent Labs  02/29/16 0501 03/01/16 0319  WBC 8.2 16.2*  HGB 12.1 11.8*  HCT 36.1 35.5*  MCV 95.5 97.0  PLT 186 Q000111Q   Basic Metabolic Panel  Recent Labs  02/29/16 0501 03/01/16 0319  NA 138 134*  K 3.7 4.2  CL 103 102  CO2 20* 23  GLUCOSE 165* 153*  BUN 13 26*  CREATININE 1.00 1.04*  CALCIUM 9.6 9.7   Liver Function Tests  Recent Labs  02/29/16 0501  AST 30  ALT 26  ALKPHOS 55  BILITOT 0.9  PROT 7.3  ALBUMIN 3.5   Cardiac Enzymes  Recent Labs  02/28/16 2319 02/29/16 0501 02/29/16 1301  TROPONINI 0.10* 0.07* 0.06*     Recent Labs  02/29/16 0501  HGBA1C 5.4   Thyroid Function Tests  Recent Labs  02/29/16 0501  TSH 1.138    Telemetry    NSR - Personally Reviewed  ECG    NSR  - Personally Reviewed  Radiology    Nm Myocar Multi W/spect W/wall Motion / Ef  Result Date: 03/01/2016 CLINICAL DATA:  Smoking history. Hypertension. Shortness of breath. COPD. Elevated troponin. EXAM: MYOCARDIAL IMAGING WITH SPECT (REST AND PHARMACOLOGIC-STRESS) GATED LEFT VENTRICULAR WALL MOTION STUDY LEFT VENTRICULAR EJECTION FRACTION TECHNIQUE: Standard myocardial SPECT imaging was performed after resting intravenous injection of 10 mCi Tc-36m tetrofosmin. Subsequently, intravenous infusion of Lexiscan was performed under the supervision of the Cardiology staff. At peak effect of the drug, 30 mCi Tc-61m tetrofosmin was injected intravenously and standard myocardial SPECT imaging was performed. Quantitative gated imaging was also performed to evaluate left ventricular  wall motion, and estimate left ventricular ejection fraction. COMPARISON:  None. FINDINGS: Perfusion: Extensive areas of remote infarct are noted along the inferior and lateral walls. There is remote apical infarct. No definite reversible perfusion is present. Wall Motion: Marked global hypokinesia is present. Left Ventricular Ejection Fraction: 28 % End diastolic volume 99991111 ml End systolic volume 99991111 ml IMPRESSION: 1. 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 2. Marked global hypokinesia. 3. Left ventricular ejection fraction 28% 4. Non invasive risk stratification*: By *2012 Appropriate Use Criteria for Coronary Revascularization Focused Update: J Am Coll Cardiol. [id number]. http://[email or web address]?articleid=[email or web address] Electronically Signed   By: San Morelle M.D.   On: 03/01/2016 14:03    Cardiac Studies   Transthoracic Echocardiography 03/01/16  Study Conclusions  - Left ventricle: The cavity size was mildly dilated. Wall   thickness was normal. Inferolateral akinesis. Basal inferior   akinesis. Anterolateral hypokinesis. The estimated ejection   fraction was 35%. Features are consistent with a pseudonormal   left ventricular filling pattern, with concomitant abnormal   relaxation and increased filling pressure (grade 2 diastolic   dysfunction). E/medial e&' > 15 suggests LV end diastolic pressure   at least 20 mmHg. - Aortic valve: There was no stenosis. - Mitral valve: There was mild regurgitation. - Right ventricle: The cavity size was normal. Systolic function   was normal. - Pulmonary arteries: PA peak pressure: 36 mm Hg (S). - Inferior vena cava: The vessel was normal in size. The   respirophasic diameter changes were in the normal range (>= 50%),   consistent with normal central venous pressure.  Impressions:  - Mildly dilated LV with EF 35%. Wall motion abnormalities as  noted   above. Somewhat difficult images, Definity contrast may have   helped. Moderate diastolic dysfunction with evidence for elevated   LV filling pressure. Normal RV size and systolic function. Mild   mitral regurgitation.  EXAM: MYOCARDIAL IMAGING WITH SPECT (REST AND PHARMACOLOGIC-STRESS)  IMPRESSION: 1. 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  2. Marked global hypokinesia.  3. Left ventricular ejection fraction 28%  4. 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 She presented to the ED on 02/28/16 with SOB, found to have elevated troponin so Cardiology was consulted.  Stress test showed inferolateral reversible  ischemia, and Echo with reduced EF and wall motion abnormality. For cath today.    Assessment & Plan    1. NSTEMI: Presented with SOB and NSTEMI, never had any chest pain but says that she has been fatigued for about 3 months now. Ms. Calvo has a long-standing tobacco history, having started smoking at age 60 and quit smoking at age 18. Had coronary artery calcification on chest CT.   Stress test abnormal and Echo with reduced EF of 35% with anterolateral hypokinesis. Will proceed with heart cath today.    2. COPD exacerbation: Improved with antibiotics. Per primary team.   3. HTN: Well controlled on amlodipine.    Signed, Arbutus Leas, NP  03/02/2016, 10:42 AM   Patient seen and examined. Agree with assessment and plan. No chest pain. High risk nuclear study suggesting multivessel disease.  Awaiting cath later today. BB and nitrates started last night.   Troy Sine, MD, [facility] 03/02/2016 2:28 PM

## 2016-03-02 NOTE — Care Management Important Message (Signed)
Important Message  Patient Details  Name: Kelly Benjamin MRN: [id number] Date of Birth: 1940/10/30   Medicare Important Message Given:  Yes    Nathen May 03/02/2016, 1:55 PM

## 2016-03-02 NOTE — Progress Notes (Signed)
Patient Name: Kelly Benjamin Date of Encounter: 03/02/2016  Primary Cardiologist: New, Dr. [facility] Problem List     Active Problems:   COPD exacerbation (HCC)   Elevated troponin   Hyperglycemia   SOB (shortness of breath)   Non-STEMI (non-ST elevated myocardial infarction) (HCC)   Cardiomyopathy, ischemic   Abnormal nuclear stress test     Subjective   Feels well, denies chest pain and SOB.   Inpatient Medications    Scheduled Meds: . amLODipine  10 mg Oral Daily  . aspirin EC  81 mg Oral Daily  . atorvastatin  80 mg Oral q1800  . fluticasone furoate-vilanterol  1 puff Inhalation Daily  . heparin subcutaneous  5,000 Units Subcutaneous Q8H  . isosorbide mononitrate  30 mg Oral Daily  . levofloxacin  500 mg Oral Daily  . metoprolol tartrate  25 mg Oral BID  . predniSONE  30 mg Oral Q breakfast  . sodium chloride flush  3 mL Intravenous Q12H  . sodium chloride flush  3 mL Intravenous Q12H  . sodium chloride flush  3 mL Intravenous Q12H  . tiotropium  18 mcg Inhalation Daily   Continuous Infusions: . sodium chloride 100 mL/hr at 03/02/16 0455   PRN Meds: sodium chloride, sodium chloride, acetaminophen **OR** acetaminophen, albuterol, hydrALAZINE, sodium chloride flush, sodium chloride flush   Vital Signs    Vitals:   03/01/16 2228 03/02/16 0426 03/02/16 0838 03/02/16 0839  BP: 131/81 134/79    Pulse: 80 75    Resp: 20 20    Temp: 97.8 F (36.6 C) 98.9 F (37.2 C)    TempSrc: Oral Oral    SpO2: 96% 94% 93% 93%  Weight:  168 lb 3.2 oz (76.3 kg)    Height:        Intake/Output Summary (Last 24 hours) at 03/02/16 1042 Last data filed at 03/02/16 0500  Gross per 24 hour  Intake          1208.33 ml  Output              450 ml  Net           758.33 ml   Filed Weights   02/29/16 0452 03/01/16 0305 03/02/16 0426  Weight: 169 lb 3.2 oz (76.7 kg) 168 lb 9.6 oz (76.5 kg) 168 lb 3.2 oz (76.3 kg)    Physical Exam   General: Pleasant, NAD Psych:  Normal affect. Neuro: Alert and oriented X 3. Moves all extremities spontaneously. HEENT: Normal           Neck: Supple without bruits or JVD. Lungs:  Resp regular and unlabored, scattered expiratory wheezing.  Heart: RRR no s3, s4, or murmurs. Abdomen: Soft, non-tender, non-distended, BS + x 4.  Extremities: No clubbing, cyanosis or edema. DP/PT/Radials 2+ and equal bilaterally. Labs    CBC  Recent Labs  02/29/16 0501 03/01/16 0319  WBC 8.2 16.2*  HGB 12.1 11.8*  HCT 36.1 35.5*  MCV 95.5 97.0  PLT 186 Q000111Q   Basic Metabolic Panel  Recent Labs  02/29/16 0501 03/01/16 0319  NA 138 134*  K 3.7 4.2  CL 103 102  CO2 20* 23  GLUCOSE 165* 153*  BUN 13 26*  CREATININE 1.00 1.04*  CALCIUM 9.6 9.7   Liver Function Tests  Recent Labs  02/29/16 0501  AST 30  ALT 26  ALKPHOS 55  BILITOT 0.9  PROT 7.3  ALBUMIN 3.5   Cardiac Enzymes  Recent Labs  02/28/16 2319 02/29/16 0501 02/29/16 1301  TROPONINI 0.10* 0.07* 0.06*     Recent Labs  02/29/16 0501  HGBA1C 5.4   Thyroid Function Tests  Recent Labs  02/29/16 0501  TSH 1.138    Telemetry    NSR - Personally Reviewed  ECG    NSR  - Personally Reviewed  Radiology    Nm Myocar Multi W/spect W/wall Motion / Ef  Result Date: 03/01/2016 CLINICAL DATA:  Smoking history. Hypertension. Shortness of breath. COPD. Elevated troponin. EXAM: MYOCARDIAL IMAGING WITH SPECT (REST AND PHARMACOLOGIC-STRESS) GATED LEFT VENTRICULAR WALL MOTION STUDY LEFT VENTRICULAR EJECTION FRACTION TECHNIQUE: Standard myocardial SPECT imaging was performed after resting intravenous injection of 10 mCi Tc-54m tetrofosmin. Subsequently, intravenous infusion of Lexiscan was performed under the supervision of the Cardiology staff. At peak effect of the drug, 30 mCi Tc-65m tetrofosmin was injected intravenously and standard myocardial SPECT imaging was performed. Quantitative gated imaging was also performed to evaluate left ventricular  wall motion, and estimate left ventricular ejection fraction. COMPARISON:  None. FINDINGS: Perfusion: Extensive areas of remote infarct are noted along the inferior and lateral walls. There is remote apical infarct. No definite reversible perfusion is present. Wall Motion: Marked global hypokinesia is present. Left Ventricular Ejection Fraction: 28 % End diastolic volume 99991111 ml End systolic volume 99991111 ml IMPRESSION: 1. 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 2. Marked global hypokinesia. 3. Left ventricular ejection fraction 28% 4. Non invasive risk stratification*: By *2012 Appropriate Use Criteria for Coronary Revascularization Focused Update: J Am Coll Cardiol. [id number]. http://[email or web address] Electronically Signed   By: San Morelle M.D.   On: 03/01/2016 14:03    Cardiac Studies   Transthoracic Echocardiography 03/01/16  Study Conclusions  - Left ventricle: The cavity size was mildly dilated. Wall   thickness was normal. Inferolateral akinesis. Basal inferior   akinesis. Anterolateral hypokinesis. The estimated ejection   fraction was 35%. Features are consistent with a pseudonormal   left ventricular filling pattern, with concomitant abnormal   relaxation and increased filling pressure (grade 2 diastolic   dysfunction). E/medial e&' > 15 suggests LV end diastolic pressure   at least 20 mmHg. - Aortic valve: There was no stenosis. - Mitral valve: There was mild regurgitation. - Right ventricle: The cavity size was normal. Systolic function   was normal. - Pulmonary arteries: PA peak pressure: 36 mm Hg (S). - Inferior vena cava: The vessel was normal in size. The   respirophasic diameter changes were in the normal range (>= 50%),   consistent with normal central venous pressure.  Impressions:  - Mildly dilated LV with EF 35%. Wall motion abnormalities as  noted   above. Somewhat difficult images, Definity contrast may have   helped. Moderate diastolic dysfunction with evidence for elevated   LV filling pressure. Normal RV size and systolic function. Mild   mitral regurgitation.  EXAM: MYOCARDIAL IMAGING WITH SPECT (REST AND PHARMACOLOGIC-STRESS)  IMPRESSION: 1. 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  2. Marked global hypokinesia.  3. Left ventricular ejection fraction 28%  4. 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 She presented to the ED on 02/28/16 with SOB, found to have elevated troponin so Cardiology was consulted.  Stress test showed inferolateral reversible  ischemia, and Echo with reduced EF and wall motion abnormality. For cath today.    Assessment & Plan    1. NSTEMI: Presented with SOB and NSTEMI, never had any chest pain but says that she has been fatigued for about 3 months now. Ms. Nand has a long-standing tobacco history, having started smoking at age 88 and quit smoking at age 57. Had coronary artery calcification on chest CT.   Stress test abnormal and Echo with reduced EF of 35% with anterolateral hypokinesis. Will proceed with heart cath today.    2. COPD exacerbation: Improved with antibiotics. Per primary team.   3. HTN: Well controlled on amlodipine.    Signed, Arbutus Leas, NP  03/02/2016, 10:42 AM   Patient seen and examined. Agree with assessment and plan. No chest pain. High risk nuclear study suggesting multivessel disease.  Awaiting cath later today. BB and nitrates started last night.   Troy Sine, MD, [facility] 03/02/2016 2:28 PM

## 2016-03-02 NOTE — Interval H&P Note (Signed)
Cath Lab Visit (complete for each Cath Lab visit)  Clinical Evaluation Leading to the Procedure:   ACS: Yes.    Non-ACS:    Anginal Classification: CCS Benjamin  Anti-ischemic medical therapy: Minimal Therapy (1 class of medications)  Non-Invasive Test Results: High-risk stress test findings: cardiac mortality >3%/year  Prior CABG: No previous CABG      History and Physical Interval Note:  03/02/2016 4:27 PM  Kelly Benjamin  has presented today for surgery, with the diagnosis of cp  The various methods of treatment have been discussed with the patient and family. After consideration of risks, benefits and other options for treatment, the patient has consented to  Procedure(s): Left Heart Cath and Coronary Angiography (N/A) as a surgical intervention .  The patient's history has been reviewed, patient examined, no change in status, stable for surgery.  I have reviewed the patient's chart and labs.  Questions were answered to the patient's satisfaction.     Kelly Benjamin

## 2016-03-02 NOTE — CV Procedure (Signed)
   Diagnostic coronary angiography completed without complications.  Moderate disease in the distal one third of the LAD and the apex.  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 There are also well formed left to right coronary collaterals.  In absence of ischemic symptoms or evidence of ischemia on nuclear study, medical therapy is guidelines mandated therapy of choice.Marland Kitchen
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
## 2016-03-03 ENCOUNTER — Inpatient Hospital Stay (HOSPITAL_COMMUNITY): Payer: Medicare PPO

## 2016-03-03 DIAGNOSIS — I5021 Acute systolic (congestive) heart failure: Secondary | ICD-10-CM

## 2016-03-03 DIAGNOSIS — I519 Heart disease, unspecified: Secondary | ICD-10-CM

## 2016-03-03 DIAGNOSIS — I25118 Atherosclerotic heart disease of native coronary artery with other forms of angina pectoris: Secondary | ICD-10-CM

## 2016-03-03 DIAGNOSIS — I10 Essential (primary) hypertension: Secondary | ICD-10-CM

## 2016-03-03 LAB — BASIC METABOLIC PANEL
Anion gap: 5 (ref 5–15)
BUN: 28 mg/dL — AB (ref 6–20)
CHLORIDE: 109 mmol/L (ref 101–111)
CO2: 23 mmol/L (ref 22–32)
CREATININE: 0.94 mg/dL (ref 0.44–1.00)
Calcium: 8.9 mg/dL (ref 8.9–10.3)
GFR calc Af Amer: >60 mL/min (ref >60)
GFR calc non Af Amer: 58 mL/min — ABNORMAL LOW (ref >60)
Glucose, Bld: 98 mg/dL (ref 65–99)
POTASSIUM: 3.8 mmol/L (ref 3.5–5.1)
Sodium: 137 mmol/L (ref 135–145)

## 2016-03-03 MED ORDER — FUROSEMIDE 10 MG/ML IJ SOLN
40.0000 mg | Freq: Once | INTRAMUSCULAR | Status: AC
  Administered 2016-03-03: 40 mg via INTRAVENOUS
  Filled 2016-03-03: qty 4

## 2016-03-03 MED ORDER — METOPROLOL SUCCINATE ER 25 MG PO TB24
25.0000 mg | ORAL_TABLET | Freq: Every day | ORAL | Status: DC
  Administered 2016-03-03 – 2016-03-05 (×3): 25 mg via ORAL
  Filled 2016-03-03 (×3): qty 1

## 2016-03-03 MED ORDER — CLOPIDOGREL BISULFATE 75 MG PO TABS
75.0000 mg | ORAL_TABLET | Freq: Every day | ORAL | Status: DC
Start: 2016-03-03 — End: 2016-03-05
  Administered 2016-03-03 – 2016-03-05 (×3): 75 mg via ORAL
  Filled 2016-03-03 (×3): qty 1

## 2016-03-03 NOTE — Progress Notes (Signed)
[facility] PROGRESS NOTE  Kelly Benjamin [id number] DOB: 04/16/1940 DOA: 02/28/2016  PCP: No PCP Per Patient  Brief History/Interval Summary: 75 year old female with known history of COPD mother home oxygen, hypertension, she presented with complaints of shortness of breath and nonproductive cough, admitted for COPD exacerbation, her troponins were noticed to be trending up in ED, so cardiology consulted, Stress test abnormal and Echo with reduced EF of 35% with anterolateral hypokinesis, patient subsequently underwent cardiac catheterization.  Reason for Visit: COPD exacerbation  Consultants: Cardiology  Procedures:  Transthoracic echocardiogram Study Conclusions  - Left ventricle: The cavity size was mildly dilated. Wall   thickness was normal. Inferolateral akinesis. Basal inferior   akinesis. Anterolateral hypokinesis. The estimated ejection   fraction was 35%. Features are consistent with a pseudonormal   left ventricular filling pattern, with concomitant abnormal   relaxation and increased filling pressure (grade 2 diastolic   dysfunction). E/medial e&' > 15 suggests LV end diastolic pressure   at least 20 mmHg. - Aortic valve: There was no stenosis. - Mitral valve: There was mild regurgitation. - Right ventricle: The cavity size was normal. Systolic function   was normal. - Pulmonary arteries: PA peak pressure: 36 mm Hg (S). - Inferior vena cava: The vessel was normal in size. The   respirophasic diameter changes were in the normal range (>= 50%),   consistent with normal central venous pressure.  Impressions:  - Mildly dilated LV with EF 35%. Wall motion abnormalities as noted   above. Somewhat difficult images, Definity contrast may have   helped. Moderate diastolic dysfunction with evidence for elevated   LV filling pressure. Normal RV size and systolic function. Mild   mitral regurgitation.  Nuclear stress test IMPRESSION: 1. Remote infarcts  involving the lateral and inferior wall as well as the apex without definite reversible ischemia. 2. Marked global hypokinesia. 3. Left ventricular ejection fraction 28% 4. Non invasive risk stratification*:   Cardiac catheterization  Moderate disease in the distal one third of the LAD and the apex.  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 There are also well formed left to right coronary collaterals.  In absence of ischemic symptoms or evidence of ischemia on nuclear study, medical therapy is guidelines mandated therapy of choice.Marland Kitchen  Antibiotics: Levaquin  Subjective/Interval History: Patient feels well this morning. Denies any chest pain. Breathing is improved. Denies any nausea, vomiting  ROS: No headaches  Objective:  Vital Signs  Vitals:   03/03/16 0421 03/03/16 0825 03/03/16 1016 03/03/16 1358  BP: 121/70 121/60 121/60 111/61  Pulse: (!) 59 76 68 62  Resp: 17   18  Temp: 97.5 F (36.4 C)   98.3 F (36.8 C)  TempSrc: Axillary   Oral  SpO2: 92%   95%  Weight:      Height:        Intake/Output Summary (Last 24 hours) at 03/03/16 1555 Last data filed at 03/03/16 1500  Gross per 24 hour  Intake              723 ml  Output              850 ml  Net             -127 ml   Filed Weights   03/01/16 0305 03/02/16 0426 03/03/16 0412  Weight: 76.5 kg (168 lb 9.6 oz)  76.3 kg (168 lb 3.2 oz) 77.9 kg (171 lb 12.8 oz)    General appearance: alert, cooperative, appears stated age and no distress Resp: Good air entry bilaterally. Scattered wheezes. Few crackles at the bases Cardio: regular rate and rhythm, S1, S2 normal, no murmur, click, rub or gallop GI: soft, non-tender; bowel sounds normal; no masses,  no organomegaly Extremities: extremities normal, atraumatic, no cyanosis or edema Neurologic: No focal neurological  deficit  Lab Results:  Data Reviewed: I have personally reviewed following labs and imaging studies  CBC:  Recent Labs Lab 02/28/16 1243 02/29/16 0501 03/01/16 0319 03/02/16 1955  WBC 9.1 8.2 16.2* 10.7*  HGB 13.4 12.1 11.8* 11.3*  HCT 40.0 36.1 35.5* 34.9*  MCV 98.8 95.5 97.0 98.0  PLT 177 186 184 [id number]    Basic Metabolic Panel:  Recent Labs Lab 02/28/16 1243 02/29/16 0501 03/01/16 0319 03/02/16 1955 03/03/16 0512  NA 134* 138 134*  --  137  K 4.4 3.7 4.2  --  3.8  CL 102 103 102  --  109  CO2 22 20* 23  --  23  GLUCOSE 119* 165* 153*  --  98  BUN 16 13 26*  --  28*  CREATININE 0.85 1.00 1.04* 1.07* 0.94  CALCIUM 9.6 9.6 9.7  --  8.9    GFR: Estimated Creatinine Clearance: 56.8 mL/min (by C-G formula based on SCr of 0.94 mg/dL).  Liver Function Tests:  Recent Labs Lab 02/29/16 0501  AST 30  ALT 26  ALKPHOS 55  BILITOT 0.9  PROT 7.3  ALBUMIN 3.5     Coagulation Profile:  Recent Labs Lab 03/02/16 0550  INR 1.06    Cardiac Enzymes:  Recent Labs Lab 02/28/16 1543 02/28/16 1943 02/28/16 2319 02/29/16 0501 02/29/16 1301  TROPONINI 0.10* 0.14* 0.10* 0.07* 0.06*     Recent Results (from the past 240 hour(s))  MRSA PCR Screening     Status: None   Collection Time: 02/29/16  5:21 AM  Result Value Ref Range Status   MRSA by PCR NEGATIVE NEGATIVE Final    Comment:        The GeneXpert MRSA Assay (FDA approved for NASAL specimens only), is one component of a comprehensive MRSA colonization surveillance program. It is not intended to diagnose MRSA infection nor to guide or monitor treatment for MRSA infections.       Radiology Studies: Dg Chest 2 View  Result Date: 03/03/2016 CLINICAL DATA:  history of COPD, HTN, and breast cancer. She presented to the ED on 02/28/16 with SOB, found to have elevated troponin so Cardiology was consulted. Stress test showed inferolateral reversible ischemia. Pt states she feels fine n.*comment was  truncated* EXAM: CHEST  2 VIEW COMPARISON:  02/28/2016 FINDINGS: Heart is enlarged. There are Kerley B-lines consistent with interstitial pulmonary edema. Pulmonary nodules identified on recent chest x-ray and chest CT are less well seen likely due to edema. No focal consolidations are identified. Small pleural effusions are present. Status post left mastectomy and axillary node dissection. IMPRESSION: Pulmonary edema similar in appearance to prior study. Electronically Signed   By: Nolon Nations M.D.   On: 03/03/2016 12:13     Medications:  Scheduled: . aspirin  81 mg Oral Daily  . atorvastatin  80 mg Oral q1800  . clopidogrel  75 mg Oral Daily  . fluticasone furoate-vilanterol  1 puff Inhalation Daily  . heparin  5,000 Units Subcutaneous Q8H  . isosorbide mononitrate  30 mg Oral Daily  .  levofloxacin  500 mg Oral Daily  . metoprolol succinate  25 mg Oral Daily  . predniSONE  30 mg Oral Q breakfast  . sodium chloride flush  3 mL Intravenous Q12H  . tiotropium  18 mcg Inhalation Daily   Continuous:  [id number] chloride, acetaminophen **OR** acetaminophen, albuterol, hydrALAZINE, ondansetron (ZOFRAN) IV, oxyCODONE-acetaminophen, sodium chloride flush  Assessment/Plan:  Principal Problem:   Non-STEMI (non-ST elevated myocardial infarction) ([place]) Active Problems:   COPD exacerbation (HCC)   Elevated troponin   Hyperglycemia   SOB (shortness of breath)   Cardiomyopathy, ischemic   Abnormal nuclear stress test   Hypoxia    Acute COPD exacerbation She was admitted to the hospital and started on steroids, nebulizer treatments and antibiotics. She slowly improved. She initially also required BiPAP. Appears to be close to baseline now.  Elevated troponins Patient had minimally elevated troponin. Echocardiogram was done which revealed diminished ejection fraction. Wall motion abnormalities were noted. Cardiology was consulted. Patient underwent stress test which was abnormal as  well. She subsequently underwent cardiac catheterization which does show disease in the LAD and RCA, however collaterals were also noted. Medical management is recommended. She is on aspirin, Plavix, nitrates and beta blocker. Cardiology to also consider ACE inhibitor in the near future, considering her diminished systolic function.   Essential Hypertension Continue with home medication  Acute on chronic systolic CHF Patient given a dose of intravenous Lasix today. [facility] cardiology. Chest x-ray also ordered. Consider ACE inhibitor in the near future.  DVT Prophylaxis: Subcutaneous heparin    Code Status: Full code  Family Communication: Discussed with patient  Disposition Plan: Management as outlined above. Anticipate discharge in 1-2 days.    LOS: 4 days   [facility] Pager [phone] 03/03/2016, 3:55 PM  If 7PM-7AM, please contact night-coverage at [email or web address], password [facility]

## 2016-03-03 NOTE — Progress Notes (Signed)
Patient Name: Kelly Benjamin Date of Encounter: 03/03/2016  Primary Cardiologist: None  Hospital Problem List     Principal Problem:   Non-STEMI (non-ST elevated myocardial infarction) [facility]) Active Problems:   COPD exacerbation (HCC)   Elevated troponin   Hyperglycemia   SOB (shortness of breath)   Cardiomyopathy, ischemic   Abnormal nuclear stress test   Hypoxia     Subjective   No chest pain, developed a cough overnight.  Inpatient Medications    Scheduled Meds: . aspirin  81 mg Oral Daily  . atorvastatin  80 mg Oral q1800  . fluticasone furoate-vilanterol  1 puff Inhalation Daily  . heparin  5,000 Units Subcutaneous Q8H  . isosorbide mononitrate  30 mg Oral Daily  . levofloxacin  500 mg Oral Daily  . metoprolol tartrate  25 mg Oral BID  . predniSONE  30 mg Oral Q breakfast  . sodium chloride flush  3 mL Intravenous Q12H  . tiotropium  18 mcg Inhalation Daily   Continuous Infusions:  PRN Meds: sodium chloride, acetaminophen **OR** acetaminophen, albuterol, hydrALAZINE, ondansetron (ZOFRAN) IV, oxyCODONE-acetaminophen, sodium chloride flush   Vital Signs    Vitals:   03/02/16 1934 03/03/16 0412 03/03/16 0421 03/03/16 0825  BP: 120/83  121/70 121/60  Pulse: 82  (!) 59 76  Resp: 18  17   Temp: 98.3 F (36.8 C)  97.5 F (36.4 C)   TempSrc: Oral  Axillary   SpO2: 95%  92%   Weight:  171 lb 12.8 oz (77.9 kg)    Height:        Intake/Output Summary (Last 24 hours) at 03/03/16 0834 Last data filed at 03/03/16 0826  Gross per 24 hour  Intake              486 ml  Output              500 ml  Net              -14 ml   Filed Weights   03/01/16 0305 03/02/16 0426 03/03/16 0412  Weight: 168 lb 9.6 oz (76.5 kg) 168 lb 3.2 oz (76.3 kg) 171 lb 12.8 oz (77.9 kg)    Physical Exam    GEN: Well nourished, well developed, in no acute distress.  HEENT: Grossly normal.  Neck: Supple, no JVD, carotid bruits, or masses. Cardiac: RRR, no murmurs, rubs, or  gallops. No clubbing, cyanosis, edema.   Respiratory:  Diminished 1/4 up b/l, faint rales, no wheezes. GI: Soft, nontender, nondistended MS: no deformity or atrophy. Skin: warm and dry, no rash. Seborrheic keratoses on back. Neuro:  Strength intact. Psych: AAOx3.  Normal affect.  Labs    CBC  Recent Labs  03/01/16 0319 03/02/16 1955  WBC 16.2* 10.7*  HGB 11.8* 11.3*  HCT 35.5* 34.9*  MCV 97.0 98.0  PLT 184 [id number]   Basic Metabolic Panel  Recent Labs  03/01/16 0319 03/02/16 1955 03/03/16 0512  NA 134*  --  137  K 4.2  --  3.8  CL 102  --  109  CO2 23  --  23  GLUCOSE 153*  --  98  BUN 26*  --  28*  CREATININE 1.04* 1.07* 0.94  CALCIUM 9.7  --  8.9   Liver Function Tests No results for input(s): AST, ALT, ALKPHOS, BILITOT, PROT, ALBUMIN in the last 72 hours. No results for input(s): LIPASE, AMYLASE in the last 72 hours. Cardiac Enzymes  Recent Labs  02/29/16 1301  TROPONINI 0.06*   BNP Invalid input(s): POCBNP D-Dimer No results for input(s): DDIMER in the last 72 hours. Hemoglobin A1C No results for input(s): HGBA1C in the last 72 hours. Fasting Lipid Panel No results for input(s): CHOL, HDL, LDLCALC, TRIG, CHOLHDL, LDLDIRECT in the last 72 hours. Thyroid Function Tests No results for input(s): TSH, T4TOTAL, T3FREE, THYROIDAB in the last 72 hours.  Invalid input(s): FREET3  Telemetry     - Personally Reviewed  ECG     - Personally Reviewed  Radiology    Nm Myocar Multi W/spect W/wall Motion / Ef  Result Date: 03/01/2016 CLINICAL DATA:  Smoking history. Hypertension. Shortness of breath. COPD. Elevated troponin. EXAM: MYOCARDIAL IMAGING WITH SPECT (REST AND PHARMACOLOGIC-STRESS) GATED LEFT VENTRICULAR WALL MOTION STUDY LEFT VENTRICULAR EJECTION FRACTION TECHNIQUE: Standard myocardial SPECT imaging was performed after resting intravenous injection of 10 mCi Tc-72m tetrofosmin. Subsequently, intravenous infusion of Lexiscan was performed under the  supervision of the Cardiology staff. At peak effect of the drug, 30 mCi Tc-43m tetrofosmin was injected intravenously and standard myocardial SPECT imaging was performed. Quantitative gated imaging was also performed to evaluate left ventricular wall motion, and estimate left ventricular ejection fraction. COMPARISON:  None. FINDINGS: Perfusion: Extensive areas of remote infarct are noted along the inferior and lateral walls. There is remote apical infarct. No definite reversible perfusion is present. Wall Motion: Marked global hypokinesia is present. Left Ventricular Ejection Fraction: 28 % End diastolic volume 99991111 ml End systolic volume 99991111 ml IMPRESSION: 1. 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 2. Marked global hypokinesia. 3. Left ventricular ejection fraction 28% 4. Non invasive risk stratification*: By *2012 Appropriate Use Criteria for Coronary Revascularization Focused Update: J Am Coll Cardiol. [id number]. http://content.airportbarriers.com.aspx?articleid=[email or web address] Electronically Signed   By: San Morelle M.D.   On: 03/01/2016 14:03    Cardiac Studies   Cath reviewed  Moderate disease in the distal one third of the LAD and the apex.  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 There are also well formed left to right coronary collaterals.  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 She presented to the ED on 02/28/16 with SOB, found to have elevated troponin so Cardiology was consulted.  Stress test showed inferolateral reversible ischemia, and Echo with reduced EF and wall motion abnormality. Coronary angiography performed 03/02/16.   Assessment & Plan    1.  NSTEMI/CAD: Medical therapy recommended. Currently on ASA, beta blocker, statin, and nitrates. Will switch metoprolol tartrate to succinate given LV dysfunction. Will add Plavix.  2. COPD exacerbation: Improved with antibiotics. Per primary team.   3. Essential HTN: Well controlled. Would not provide amlodipine on discharge but would add ACEI when feasible.  4. Acute systolic heart failure/severe LV dysfunction/ischemic cardiomyopathy: Will obtain chest xray. Will give one dose 40 mg IV Lasix today. EF 35%. Will switch metoprolol tartrate to succinate given LV dysfunction. Add ACEI when feasible. LVEF will need to be reassessed in the next several months after meds have been optimized to see if AICD is indicated.  Signed, Kate Sable, MD  03/03/2016, 8:34 AM

## 2016-03-04 DIAGNOSIS — I2510 Atherosclerotic heart disease of native coronary artery without angina pectoris: Secondary | ICD-10-CM

## 2016-03-04 DIAGNOSIS — I5023 Acute on chronic systolic (congestive) heart failure: Secondary | ICD-10-CM

## 2016-03-04 LAB — BASIC METABOLIC PANEL
Anion gap: 9 (ref 5–15)
BUN: 24 mg/dL — AB (ref 6–20)
CALCIUM: 9.3 mg/dL (ref 8.9–10.3)
CO2: 23 mmol/L (ref 22–32)
CREATININE: 0.95 mg/dL (ref 0.44–1.00)
Chloride: 104 mmol/L (ref 101–111)
GFR calc Af Amer: >60 mL/min (ref >60)
GFR, EST NON AFRICAN AMERICAN: 58 mL/min — AB (ref >60)
Glucose, Bld: 96 mg/dL (ref 65–99)
POTASSIUM: 3.5 mmol/L (ref 3.5–5.1)
SODIUM: 136 mmol/L (ref 135–145)

## 2016-03-04 MED ORDER — FUROSEMIDE 10 MG/ML IJ SOLN
40.0000 mg | Freq: Every day | INTRAMUSCULAR | Status: DC
  Administered 2016-03-04: 40 mg via INTRAVENOUS
  Filled 2016-03-04 (×2): qty 4

## 2016-03-04 MED ORDER — POTASSIUM CHLORIDE CRYS ER 20 MEQ PO TBCR
20.0000 meq | EXTENDED_RELEASE_TABLET | Freq: Once | ORAL | Status: AC
  Administered 2016-03-04: 20 meq via ORAL
  Filled 2016-03-04: qty 1

## 2016-03-04 MED ORDER — LISINOPRIL 2.5 MG PO TABS
2.5000 mg | ORAL_TABLET | Freq: Every day | ORAL | Status: DC
Start: 2016-03-04 — End: 2016-03-05
  Administered 2016-03-04 – 2016-03-05 (×2): 2.5 mg via ORAL
  Filled 2016-03-04 (×2): qty 1

## 2016-03-04 NOTE — Progress Notes (Signed)
Pt refusing to take lipitor,  would like to take something other than lipitor for cholesterol control, states she has had problems with statins in the past. Edward Qualia RN

## 2016-03-04 NOTE — Progress Notes (Signed)
[facility] PROGRESS NOTE  SEERIT VILE [id number] DOB: [birth year] DOA: 02/28/2016  PCP: No PCP Per Patient  Brief History/Interval Summary: 75 year old female with known history of COPD mother home oxygen, hypertension, she presented with complaints of shortness of breath and nonproductive cough, admitted for COPD exacerbation, her troponins were noticed to be trending up in ED, so cardiology consulted, Stress test abnormal and Echo with reduced EF of 35% with anterolateral hypokinesis, patient subsequently underwent cardiac catheterization.  Reason for Visit: Acute systolic CHF  Consultants: Cardiology  Procedures:  Transthoracic echocardiogram Study Conclusions  - Left ventricle: The cavity size was mildly dilated. Wall   thickness was normal. Inferolateral akinesis. Basal inferior   akinesis. Anterolateral hypokinesis. The estimated ejection   fraction was 35%. Features are consistent with a pseudonormal   left ventricular filling pattern, with concomitant abnormal   relaxation and increased filling pressure (grade 2 diastolic   dysfunction). E/medial e&' > 15 suggests LV end diastolic pressure   at least 20 mmHg. - Aortic valve: There was no stenosis. - Mitral valve: There was mild regurgitation. - Right ventricle: The cavity size was normal. Systolic function   was normal. - Pulmonary arteries: PA peak pressure: 36 mm Hg (S). - Inferior vena cava: The vessel was normal in size. The   respirophasic diameter changes were in the normal range (>= 50%),   consistent with normal central venous pressure.  Impressions:  - Mildly dilated LV with EF 35%. Wall motion abnormalities as noted   above. Somewhat difficult images, Definity contrast may have   helped. Moderate diastolic dysfunction with evidence for elevated   LV filling pressure. Normal RV size and systolic function. Mild   mitral regurgitation.  Nuclear stress test IMPRESSION: 1. Remote infarcts  involving the lateral and inferior wall as well as the apex without definite reversible ischemia. 2. Marked global hypokinesia. 3. Left ventricular ejection fraction 28% 4. Non invasive risk stratification*:   Cardiac catheterization  Moderate disease in the distal one third of the LAD and the apex.  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 There are also well formed left to right coronary collaterals.  In absence of ischemic symptoms or evidence of ischemia on nuclear study, medical therapy is guidelines mandated therapy of choice.Marland Kitchen  Antibiotics: Levaquin  Subjective/Interval History: Patient continues to feel better. Breathing has improved. She denies any chest pain. No cough.   ROS: Denies any nausea or vomiting. No headaches  Objective:  Vital Signs  Vitals:   03/03/16 1358 03/03/16 2028 03/04/16 0500 03/04/16 0729  BP: 111/61 127/77 133/70   Pulse: 62 71 68   Resp: 18 18 18    Temp: 98.3 F (36.8 C) 97.9 F (36.6 C) 97.5 F (36.4 C)   TempSrc: Oral Oral Oral   SpO2: 95% 94% 96% 97%  Weight:      Height:        Intake/Output Summary (Last 24 hours) at 03/04/16 1041 Last data filed at 03/04/16 0938  Gross per 24 hour  Intake             1040 ml  Output             2350 ml  Net            -1310 ml   Filed Weights   03/01/16 0305 03/02/16 0426 03/03/16 0412  Weight: 76.5  kg (168 lb 9.6 oz) 76.3 kg (168 lb 3.2 oz) 77.9 kg (171 lb 12.8 oz)    General appearance: alert, cooperative, appears stated age and no distress Resp: Improving air entry bilaterally. Continues to have a few crackles at the bases, but less so compared to yesterday.  Cardio: regular rate and rhythm, S1, S2 normal, no murmur, click, rub or gallop GI: soft, non-tender; bowel sounds normal; no masses,  no organomegaly Extremities: No edema is  noted Neurologic: No focal neurological deficit  Lab Results:  Data Reviewed: I have personally reviewed following labs and imaging studies  CBC:  Recent Labs Lab 02/28/16 1243 02/29/16 0501 03/01/16 0319 03/02/16 1955  WBC 9.1 8.2 16.2* 10.7*  HGB 13.4 12.1 11.8* 11.3*  HCT 40.0 36.1 35.5* 34.9*  MCV 98.8 95.5 97.0 98.0  PLT 177 186 184 [id number]    Basic Metabolic Panel:  Recent Labs Lab 02/28/16 1243 02/29/16 0501 03/01/16 0319 03/02/16 1955 03/03/16 0512 03/04/16 0702  NA 134* 138 134*  --  137 136  K 4.4 3.7 4.2  --  3.8 3.5  CL 102 103 102  --  109 104  CO2 22 20* 23  --  23 23  GLUCOSE 119* 165* 153*  --  98 96  BUN 16 13 26*  --  28* 24*  CREATININE 0.85 1.00 1.04* 1.07* 0.94 0.95  CALCIUM 9.6 9.6 9.7  --  8.9 9.3    GFR: Estimated Creatinine Clearance: 56.2 mL/min (by C-G formula based on SCr of 0.95 mg/dL).  Liver Function Tests:  Recent Labs Lab 02/29/16 0501  AST 30  ALT 26  ALKPHOS 55  BILITOT 0.9  PROT 7.3  ALBUMIN 3.5     Coagulation Profile:  Recent Labs Lab 03/02/16 0550  INR 1.06    Cardiac Enzymes:  Recent Labs Lab 02/28/16 1543 02/28/16 1943 02/28/16 2319 02/29/16 0501 02/29/16 1301  TROPONINI 0.10* 0.14* 0.10* 0.07* 0.06*     Recent Results (from the past 240 hour(s))  MRSA PCR Screening     Status: None   Collection Time: 02/29/16  5:21 AM  Result Value Ref Range Status   MRSA by PCR NEGATIVE NEGATIVE Final    Comment:        The GeneXpert MRSA Assay (FDA approved for NASAL specimens only), is one component of a comprehensive MRSA colonization surveillance program. It is not intended to diagnose MRSA infection nor to guide or monitor treatment for MRSA infections.       Radiology Studies: Dg Chest 2 View  Result Date: 03/03/2016 CLINICAL DATA:  history of COPD, HTN, and breast cancer. She presented to the ED on 02/28/16 with SOB, found to have elevated troponin so Cardiology was consulted. Stress  test showed inferolateral reversible ischemia. Pt states she feels fine n.*comment was truncated* EXAM: CHEST  2 VIEW COMPARISON:  02/28/2016 FINDINGS: Heart is enlarged. There are Kerley B-lines consistent with interstitial pulmonary edema. Pulmonary nodules identified on recent chest x-ray and chest CT are less well seen likely due to edema. No focal consolidations are identified. Small pleural effusions are present. Status post left mastectomy and axillary node dissection. IMPRESSION: Pulmonary edema similar in appearance to prior study. Electronically Signed   By: Nolon Nations M.D.   On: 03/03/2016 12:13     Medications:  Scheduled: . aspirin  81 mg Oral Daily  . atorvastatin  80 mg Oral q1800  . clopidogrel  75 mg Oral Daily  . fluticasone furoate-vilanterol  1 puff  Inhalation Daily  . furosemide  40 mg Intravenous Daily  . heparin  5,000 Units Subcutaneous Q8H  . isosorbide mononitrate  30 mg Oral Daily  . levofloxacin  500 mg Oral Daily  . lisinopril  2.5 mg Oral Daily  . metoprolol succinate  25 mg Oral Daily  . potassium chloride  20 mEq Oral Once  . predniSONE  30 mg Oral Q breakfast  . sodium chloride flush  3 mL Intravenous Q12H  . tiotropium  18 mcg Inhalation Daily   Continuous:  [id number] chloride, acetaminophen **OR** acetaminophen, albuterol, hydrALAZINE, ondansetron (ZOFRAN) IV, oxyCODONE-acetaminophen, sodium chloride flush  Assessment/Plan:  Principal Problem:   Non-STEMI (non-ST elevated myocardial infarction) ([place]) Active Problems:   COPD exacerbation (HCC)   Elevated troponin   Hyperglycemia   SOB (shortness of breath)   Cardiomyopathy, ischemic   Abnormal nuclear stress test   Hypoxia    Acute COPD exacerbation She was admitted to the hospital and started on steroids, nebulizer treatments and antibiotics. She slowly improved. She initially also required BiPAP. Appears to be close to baseline now.  Acute on chronic systolic CHF Patient is  diuresing well. Started on IV Lasix by cardiology. However, her weight measurements do not seem to be accurate. Symptomatically, she has improved. She has been started on ACE inhibitor. Monitor blood pressures closely. If she continues to improve, she could be transitioned to oral diuretics tomorrow.   Coronary artery disease/presenting with mildly elevated troponin Patient had minimally elevated troponin. Echocardiogram was done which revealed diminished ejection fraction. Wall motion abnormalities were noted. Cardiology was consulted. Patient underwent stress test which was abnormal as well. She subsequently underwent cardiac catheterization which does show disease in the LAD and RCA, however collaterals were also noted. Medical management is recommended. She is on aspirin, Plavix, nitrates and beta blocker. ACE inhibitor was initiated.   Essential Hypertension Monitor blood pressures closely.  DVT Prophylaxis: Subcutaneous heparin    Code Status: Full code  Family Communication: Discussed with patient  Disposition Plan: Management as outlined above. Anticipate discharge in 1-2 days. Mobilize as tolerated.    LOS: 5 days   [facility] Pager [phone] 03/04/2016, 10:41 AM  If 7PM-7AM, please contact night-coverage at [email or web address], password [facility]

## 2016-03-04 NOTE — Progress Notes (Signed)
Patient Name: Kelly Benjamin Date of Encounter: 03/04/2016  Primary Cardiologist: None  Hospital Problem List     Principal Problem:   Non-STEMI (non-ST elevated myocardial infarction) [facility]) Active Problems:   COPD exacerbation (HCC)   Elevated troponin   Hyperglycemia   SOB (shortness of breath)   Cardiomyopathy, ischemic   Abnormal nuclear stress test   Hypoxia     Subjective   Eager to go home. Less short of breath than yesterday. No chest pain.  Inpatient Medications    Scheduled Meds: . aspirin  81 mg Oral Daily  . atorvastatin  80 mg Oral q1800  . clopidogrel  75 mg Oral Daily  . fluticasone furoate-vilanterol  1 puff Inhalation Daily  . furosemide  40 mg Intravenous Daily  . heparin  5,000 Units Subcutaneous Q8H  . isosorbide mononitrate  30 mg Oral Daily  . levofloxacin  500 mg Oral Daily  . lisinopril  2.5 mg Oral Daily  . metoprolol succinate  25 mg Oral Daily  . predniSONE  30 mg Oral Q breakfast  . sodium chloride flush  3 mL Intravenous Q12H  . tiotropium  18 mcg Inhalation Daily   Continuous Infusions:  PRN Meds: sodium chloride, acetaminophen **OR** acetaminophen, albuterol, hydrALAZINE, ondansetron (ZOFRAN) IV, oxyCODONE-acetaminophen, sodium chloride flush   Vital Signs    Vitals:   03/03/16 1358 03/03/16 2028 03/04/16 0500 03/04/16 0729  BP: 111/61 127/77 133/70   Pulse: 62 71 68   Resp: 18 18 18    Temp: 98.3 F (36.8 C) 97.9 F (36.6 C) 97.5 F (36.4 C)   TempSrc: Oral Oral Oral   SpO2: 95% 94% 96% 97%  Weight:      Height:        Intake/Output Summary (Last 24 hours) at 03/04/16 0858 Last data filed at 03/03/16 2226  Gross per 24 hour  Intake              680 ml  Output             1650 ml  Net             -970 ml   Filed Weights   03/01/16 0305 03/02/16 0426 03/03/16 0412  Weight: 168 lb 9.6 oz (76.5 kg) 168 lb 3.2 oz (76.3 kg) 171 lb 12.8 oz (77.9 kg)    Physical Exam   GEN: Well nourished, well developed, in no  acute distress.  HEENT: Grossly normal.  Neck: Supple, no JVD, carotid bruits, or masses. Cardiac: RRR, no murmurs, rubs, or gallops. No clubbing, cyanosis, edema.   Respiratory:  Diminished 1/4 up b/l, faint rales, no wheezes. GI: Soft, nontender, nondistended MS: no deformity or atrophy. Skin: warm and dry, no rash. Seborrheic keratoses on back. Neuro:  Strength intact. Psych: AAOx3.  Normal affect.  Labs    CBC  Recent Labs  03/02/16 1955  WBC 10.7*  HGB 11.3*  HCT 34.9*  MCV 98.0  PLT [id number]   Basic Metabolic Panel  Recent Labs  03/03/16 0512 03/04/16 0702  NA 137 136  K 3.8 3.5  CL 109 104  CO2 23 23  GLUCOSE 98 96  BUN 28* 24*  CREATININE 0.94 0.95  CALCIUM 8.9 9.3   Liver Function Tests No results for input(s): AST, ALT, ALKPHOS, BILITOT, PROT, ALBUMIN in the last 72 hours. No results for input(s): LIPASE, AMYLASE in the last 72 hours. Cardiac Enzymes No results for input(s): CKTOTAL, CKMB, CKMBINDEX, TROPONINI in the last 72 hours.  BNP Invalid input(s): POCBNP D-Dimer No results for input(s): DDIMER in the last 72 hours. Hemoglobin A1C No results for input(s): HGBA1C in the last 72 hours. Fasting Lipid Panel No results for input(s): CHOL, HDL, LDLCALC, TRIG, CHOLHDL, LDLDIRECT in the last 72 hours. Thyroid Function Tests No results for input(s): TSH, T4TOTAL, T3FREE, THYROIDAB in the last 72 hours.  Invalid input(s): FREET3  Telemetry    NSR with PVC's - Personally Reviewed  ECG     - Personally Reviewed  Radiology    Dg Chest 2 View  Result Date: 03/03/2016 CLINICAL DATA:  history of COPD, HTN, and breast cancer. She presented to the ED on 02/28/16 with SOB, found to have elevated troponin so Cardiology was consulted. Stress test showed inferolateral reversible ischemia. Pt states she feels fine n.*comment was truncated* EXAM: CHEST  2 VIEW COMPARISON:  02/28/2016 FINDINGS: Heart is enlarged. There are Kerley B-lines consistent with  interstitial pulmonary edema. Pulmonary nodules identified on recent chest x-ray and chest CT are less well seen likely due to edema. No focal consolidations are identified. Small pleural effusions are present. Status post left mastectomy and axillary node dissection. IMPRESSION: Pulmonary edema similar in appearance to prior study. Electronically Signed   By: Nolon Nations M.D.   On: 03/03/2016 12:13    Cardiac Studies   Cath reviewed  Moderate disease in the distal one third of the LAD and the apex.  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 There are also well formed left to right coronary collaterals.  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 She presented to the ED on 02/28/16 with SOB, found to have elevated troponin so Cardiology was consulted. Stress test showed inferolateral reversible ischemia, and Echo with reduced EF and wall motion abnormality. Coronary angiography performed 03/02/16.   Assessment & Plan    1. NSTEMI/CAD: Medical therapy recommended. Currently on ASA, Toprol-XL, Plavix, statin, and nitrates. I will add low-dose lisinopril 2.5 mg today. Imdur may have to be stopped if BP gets too low.  2. COPD exacerbation: Improved with antibiotics. Per primary team.   3. Essential HTN: Well controlled. Would not provide amlodipine on discharge.  I will add low-dose lisinopril 2.5 mg today due to LV dysfunction and NSTEMI. Imdur may have to be stopped if BP gets too low.  4. Acute systolic heart failure/severe LV dysfunction/ischemic cardiomyopathy: Chest xray 12/30 showed pulmonary edema. I gave one dose 40 mg IV Lasix yesterday with 727 cc output. I will start IV Lasix 40 mg daily. EF 35%. I switched metoprolol tartrate to  succinate given LV dysfunction on 12/30.  I will add low-dose lisinopril 2.5 mg today.  LVEF will need to be reassessed in the next several months after meds have been optimized to see if AICD is indicated.  Dispo: If she is euvolemic on 03/05/16, she can be discharged on Lasix 40 mg daily.  Signed, Kate Sable, MD  03/04/2016, 8:58 AM

## 2016-03-04 NOTE — Plan of Care (Signed)
Problem: Tissue Perfusion: Goal: Risk factors for ineffective tissue perfusion will decrease Outcome: Progressing Continues on metoprolol and lasix, plavix and ASA

## 2016-03-04 NOTE — Progress Notes (Signed)
Pt ambulated in hall with standby assist, VS remained WNL, able to walk 250 ft without difficulty, denies discomfort, or SOB.  Edward Qualia RN

## 2016-03-05 LAB — BASIC METABOLIC PANEL
ANION GAP: 10 (ref 5–15)
BUN: 29 mg/dL — ABNORMAL HIGH (ref 6–20)
CALCIUM: 9.3 mg/dL (ref 8.9–10.3)
CO2: 23 mmol/L (ref 22–32)
Chloride: 104 mmol/L (ref 101–111)
Creatinine, Ser: 1.12 mg/dL — ABNORMAL HIGH (ref 0.44–1.00)
GFR, EST AFRICAN AMERICAN: 55 mL/min — AB (ref >60)
GFR, EST NON AFRICAN AMERICAN: 47 mL/min — AB (ref >60)
Glucose, Bld: 110 mg/dL — ABNORMAL HIGH (ref 65–99)
Potassium: 4.1 mmol/L (ref 3.5–5.1)
Sodium: 137 mmol/L (ref 135–145)

## 2016-03-05 MED ORDER — FUROSEMIDE 40 MG PO TABS: 40.0000 mg | ORAL_TABLET | Freq: Every day | ORAL | 0 refills | Status: DC

## 2016-03-05 MED ORDER — LISINOPRIL 2.5 MG PO TABS: 2.5000 mg | ORAL_TABLET | Freq: Every day | ORAL | 0 refills | Status: DC

## 2016-03-05 MED ORDER — CLOPIDOGREL BISULFATE 75 MG PO TABS: 75.0000 mg | ORAL_TABLET | Freq: Every day | ORAL | 0 refills | Status: DC

## 2016-03-05 MED ORDER — FLUTICASONE FUROATE-VILANTEROL 200-25 MCG/INH IN AEPB: 1.0000 | INHALATION_SPRAY | Freq: Every day | RESPIRATORY_TRACT | 0 refills | Status: AC

## 2016-03-05 MED ORDER — ASPIRIN EC 81 MG PO TBEC
81.0000 mg | DELAYED_RELEASE_TABLET | Freq: Every day | ORAL | 2 refills | Status: AC
Start: 2016-03-05 — End: ?

## 2016-03-05 MED ORDER — METOPROLOL SUCCINATE ER 25 MG PO TB24: 25.0000 mg | ORAL_TABLET | Freq: Every day | ORAL | 0 refills | Status: DC

## 2016-03-05 MED ORDER — ATORVASTATIN CALCIUM 40 MG PO TABS: 40.0000 mg | ORAL_TABLET | Freq: Every day | ORAL | 0 refills | Status: DC

## 2016-03-05 MED ORDER — ISOSORBIDE MONONITRATE ER 30 MG PO TB24: 30.0000 mg | ORAL_TABLET | Freq: Every day | ORAL | 0 refills | Status: DC

## 2016-03-05 MED ORDER — PREDNISONE 10 MG PO TABS: ORAL_TABLET | ORAL | 0 refills | Status: DC

## 2016-03-05 NOTE — Discharge Instructions (Signed)
Chronic Obstructive Pulmonary Disease Chronic obstructive pulmonary disease (COPD) is a common lung problem. In COPD, the flow of air from the lungs is limited. The way your lungs work will probably never return to normal, but there are things you can do to improve your lungs and make yourself feel better. Your doctor may treat your condition with:  Medicines.  Oxygen.  Lung surgery.  Changes to your diet.  Rehabilitation. This may involve a team of specialists. Follow these instructions at home:  Take all medicines as told by your doctor.  Avoid medicines or cough syrups that dry up your airway (such as antihistamines) and do not allow you to get rid of thick spit. You do not need to avoid them if told differently by your doctor.  If you smoke, stop. Smoking makes the problem worse.  Avoid being around things that make your breathing worse (like smoke, chemicals, and fumes).  Use oxygen therapy and therapy to help improve your lungs (pulmonary rehabilitation) if told by your doctor. If you need home oxygen therapy, ask your doctor if you should buy a tool to measure your oxygen level (oximeter).  Avoid people who have a sickness you can catch (contagious).  Avoid going outside when it is very hot, cold, or humid.  Eat healthy foods. Eat smaller meals more often. Rest before meals.  Stay active, but remember to also rest.  Make sure to get all the shots (vaccines) your doctor recommends. Ask your doctor if you need a pneumonia shot.  Learn and use tips on how to relax.  Learn and use tips on how to control your breathing as told by your doctor. Try: 1. Breathing in (inhaling) through your nose for 1 second. Then, pucker your lips and breath out (exhale) through your lips for 2 seconds. 2. Putting one hand on your belly (abdomen). Breathe in slowly through your nose for 1 second. Your hand on your belly should move out. Pucker your lips and breathe out slowly through your lips.  Your hand on your belly should move in as you breathe out.  Learn and use controlled coughing to clear thick spit from your lungs. The steps are: 1. Lean your head a little forward. 2. Breathe in deeply. 3. Try to hold your breath for 3 seconds. 4. Keep your mouth slightly open while coughing 2 times. 5. Spit any thick spit out into a tissue. 6. Rest and do the steps again 1 or 2 times as needed. Contact a doctor if:  You cough up more thick spit than usual.  There is a change in the color or thickness of the spit.  It is harder to breathe than usual.  Your breathing is faster than usual. Get help right away if:  You have shortness of breath while resting.  You have shortness of breath that stops you from:  Being able to talk.  Doing normal activities.  You chest hurts for longer than 5 minutes.  Your skin color is more blue than usual.  Your pulse oximeter shows that you have low oxygen for longer than 5 minutes. This information is not intended to replace advice given to you by your health care provider. Make sure you discuss any questions you have with your health care provider. Document Released: 08/08/2007 Document Revised: 07/28/2015 Document Reviewed: 10/16/2012 Elsevier Interactive Patient Education  2017 [employer]  Coronary Artery Disease, Female Coronary artery disease (CAD) is a process in which the blood vessels of the heart (coronary arteries)  become narrow or blocked. The narrowing or blockage can lead to decreased blood flow to the heart muscle (angina). Symptoms known as angina can develop if the blood flow is reduced to the heart for a short period of time. Prolonged reduced blood flow can cause a heart attack (myocardial infarction, MI). CAD is a leading cause of death for women. More women die from CAD than from cancer, lung disease, and accidents combined. It is important for women to understand the risks, symptoms, and treatment options for CAD. What  are the causes? Atherosclerosis is the cause of CAD. Atherosclerosis is the buildup of fat and cholesterol (plaque) on the inside of the arteries. Over time, the plaque may narrow or block the artery, and this will lessen blood flow to the heart. Plaque can also become weak and break off within a coronary artery to form a clot and cause a sudden blockage. What increases the risk? Many risk factors increase your chances of getting CAD, including:  High cholesterol levels.  High blood pressure (hypertension).  Tobacco use.  Diabetes.  Age. Women over age 62 are at a greater risk of CAD.  Menopause.  All postmenopausal women are at greater risk of CAD.  Women who have experienced menopause between the ages of 19-45 (early menopause) are at a higher risk of CAD.  Women who have experienced menopause before age 72 (premature menopause) are at an extremely high risk of CAD.  Family history of CAD.  Obesity.  Lack of exercise.  A diet high in saturated fats. What are the signs or symptoms? Many people do not experience any symptoms during the early stages of CAD. As the condition progresses, symptoms may include:  Chest pain.  The pain can be described as crushing or squeezing, or a tightness, pressure, fullness, or heaviness in the chest.  The pain can last more than a few minutes or can stop and recur.  Pain in the arms, neck, jaw, or back.  Unexplained heartburn or indigestion.  Shortness of breath.  Nausea.  Sudden cold sweats.  Sudden light-headedness. Many women have chest discomfort and the other symptoms. However, women often have different (atypical) symptoms, such as:  Fatigue.  Unexplained feelings of nervousness or anxiety.  Unexplained weakness.  Dizziness or fainting. Sometimes, women may not have any symptoms of CAD. How is this diagnosed? Tests to diagnose CAD may include:  ECG (electrocardiogram).  Exercise stress test. This looks for signs  of blockage when the heart is being exercised.  Pharmacologic stress test. This test looks for signs of blockage when the heart is being stressed with a medicine.  Blood tests.  Coronary angiogram. This is a procedure to look at the coronary arteries to see if there is any blockage. How is this treated? The treatment of CAD may include the following:  Healthy behavioral changes to reduce or control risk factors.  Medicine.  Coronary stenting.A stent helps to keep an artery open.  Coronary angioplasty. This procedure widens a narrowed or blocked artery.  Coronary arterybypass surgery. This will allow your blood to pass the blockage (bypass) to reach your heart. Follow these instructions at home:  Take medicines only as directed by your health care provider.  Do not take the following medicines unless your health care provider approves:  Nonsteroidal anti-inflammatory drugs (NSAIDs), such as ibuprofen, naproxen, or celecoxib.  Vitamin supplements that contain vitamin A, vitamin E, or both.  Hormone replacement therapy that contains estrogen with or without progestin.  Manage other  health conditions such as hypertension and diabetes as directed by your health care provider.  Follow a heart-healthy diet. A dietitian can help to educate you about healthy food options and changes.  Use healthy cooking methods such as roasting, grilling, broiling, baking, poaching, steaming, or stir-frying. Talk to a dietitian to learn more about healthy cooking methods.  Follow an exercise program approved by your health care provider.  Maintain a healthy weight. Lose weight as approved by your health care provider.  Plan rest periods when fatigued.  Learn to manage stress.  Do not use any tobacco products, including cigarettes, chewing tobacco, or electronic cigarettes. If you need help quitting, ask your health care provider.  If you drink alcohol, and your health care provider approves,  limit your alcohol intake to no more than 1 drink per day. One drink equals 12 ounces of beer, 5 ounces of wine, or 1 ounces of hard liquor.  Stop illegal drug use.  Your health care provider may ask you to monitor your blood pressure. A blood pressure reading consists of a higher number over a lower number, such as 110 over 72, which is written as 110/72. Ideally, your blood pressure should be:  Below 140/90 if you have no other medical conditions.  Below 130/80 if you have diabetes or kidney disease.  Keep all follow-up visits as directed by your health care provider. This is important. Get help right away if:  You have pain in your chest, neck, arm, jaw, stomach, or back that lasts more than a few minutes, is recurring, or is unrelieved by taking medicine under your tongue (sublingualnitroglycerin).  You have profuse sweating without cause.  You have unexplained:  Heartburn or indigestion.  Shortness of breath or difficulty breathing.  Nausea or vomiting.  Fatigue.  Feelings of nervousness or anxiety.  Weakness.  Diarrhea.  You have sudden light-headedness or dizziness.  You faint. These symptoms may represent a serious problem that is an emergency. Do not wait to see if the symptoms will go away. Get medical help right away. Call your local emergency services (911 in the [place] Do not drive yourself to the hospital.  This information is not intended to replace advice given to you by your health care provider. Make sure you discuss any questions you have with your health care provider. Document Released: 05/14/2011 Document Revised: 07/28/2015 Document Reviewed: 06/23/2013 Elsevier Interactive Patient Education  2017 [place].  Heart Failure Heart failure means your heart has trouble pumping blood. This makes it hard for your body to work well. Heart failure is usually a long-term (chronic) condition. You must take good care of yourself and follow your doctor's  treatment plan. HOME CARE  Take your heart medicine as told by your doctor.  Do not stop taking medicine unless your doctor tells you to.  Do not skip any dose of medicine.  Refill your medicines before they run out.  Take other medicines only as told by your doctor or pharmacist.  Stay active if told by your doctor. The elderly and people with severe heart failure should talk with a doctor about physical activity.  Eat heart-healthy foods. Choose foods that are without trans fat and are low in saturated fat, cholesterol, and salt (sodium). This includes fresh or frozen fruits and vegetables, fish, lean meats, fat-free or low-fat dairy foods, whole grains, and high-fiber foods. Lentils and dried peas and beans (legumes) are also good choices.  Limit salt if told by your doctor.  Cook in  a healthy way. Roast, grill, broil, bake, poach, steam, or stir-fry foods.  Limit fluids as told by your doctor.  Weigh yourself every morning. Do this after you pee (urinate) and before you eat breakfast. Write down your weight to give to your doctor.  Take your blood pressure and write it down if your doctor tells you to.  Ask your doctor how to check your pulse. Check your pulse as told.  Lose weight if told by your doctor.  Stop smoking or chewing tobacco. Do not use gum or patches that help you quit without your doctor's approval.  Schedule and go to doctor visits as told.  Nonpregnant women should have no more than 1 drink a day. Men should have no more than 2 drinks a day. Talk to your doctor about drinking alcohol.  Stop illegal drug use.  Stay current with shots (immunizations).  Manage your health conditions as told by your doctor.  Learn to manage your stress.  Rest when you are tired.  If it is really hot outside:  Avoid intense activities.  Use air conditioning or fans, or get in a cooler place.  Avoid caffeine and alcohol.  Wear loose-fitting, lightweight, and  light-colored clothing.  If it is really cold outside:  Avoid intense activities.  Layer your clothing.  Wear mittens or gloves, a hat, and a scarf when going outside.  Avoid alcohol.  Learn about heart failure and get support as needed.  Get help to maintain or improve your quality of life and your ability to care for yourself as needed. GET HELP IF:   You gain weight quickly.  You are more short of breath than usual.  You cannot do your normal activities.  You tire easily.  You cough more than normal, especially with activity.  You have any or more puffiness (swelling) in areas such as your hands, feet, ankles, or belly (abdomen).  You cannot sleep because it is hard to breathe.  You feel like your heart is beating fast (palpitations).  You get dizzy or light-headed when you stand up. GET HELP RIGHT AWAY IF:   You have trouble breathing.  There is a change in mental status, such as becoming less alert or not being able to focus.  You have chest pain or discomfort.  You faint. MAKE SURE YOU:   Understand these instructions.  Will watch your condition.  Will get help right away if you are not doing well or get worse. This information is not intended to replace advice given to you by your health care provider. Make sure you discuss any questions you have with your health care provider. Document Released: 11/29/2007 Document Revised: 03/12/2014 Document Reviewed: 04/07/2012 Elsevier Interactive Patient Education  2017 [employer].

## 2016-03-05 NOTE — Progress Notes (Signed)
Patient Name: Kelly Benjamin Date of Encounter: 03/05/2016  Primary Cardiologist: None  Hospital Problem List     Principal Problem:   Non-STEMI (non-ST elevated myocardial infarction) ([place]) Active Problems:   COPD exacerbation ([facility])   Elevated troponin   Hyperglycemia   SOB (shortness of breath)   Cardiomyopathy, ischemic   Abnormal nuclear stress test   Hypoxia   CHF (congestive heart failure), NYHA class III, acute on chronic, systolic (H[facility])   Coronary artery disease involving native coronary artery of native heart without angina pectoris     Subjective   Slept well, was able to lie flat. No chest pain/SOB.  Inpatient Medications    Scheduled Meds: . aspirin  81 mg Oral Daily  . atorvastatin  80 mg Oral q1800  . clopidogrel  75 mg Oral Daily  . fluticasone furoate-vilanterol  1 puff Inhalation Daily  . furosemide  40 mg Intravenous Daily  . heparin  5,000 Units Subcutaneous Q8H  . isosorbide mononitrate  30 mg Oral Daily  . levofloxacin  500 mg Oral Daily  . lisinopril  2.5 mg Oral Daily  . metoprolol succinate  25 mg Oral Daily  . predniSONE  30 mg Oral Q breakfast  . sodium chloride flush  3 mL Intravenous Q12H  . tiotropium  18 mcg Inhalation Daily   Continuous Infusions:  PRN Meds: sodium chloride, acetaminophen **OR** acetaminophen, albuterol, hydrALAZINE, ondansetron (ZOFRAN) IV, oxyCODONE-acetaminophen, sodium chloride flush   Vital Signs    Vitals:   03/04/16 0729 03/04/16 1647 03/04/16 2143 03/05/16 0644  BP:  106/64 108/60 116/69  Pulse:  64 (!) 56   Resp:  (!) 22 17   Temp:  98.2 F (36.8 C) 98.1 F (36.7 C) 97.9 F (36.6 C)  TempSrc:  Oral Oral Oral  SpO2: 97% 97% 94% 94%  Weight:      Height:        Intake/Output Summary (Last 24 hours) at 03/05/16 0840 Last data filed at 03/04/16 1651  Gross per 24 hour  Intake              720 ml  Output             2450 ml  Net            -1730 ml   Filed Weights   03/01/16 0305 03/02/16  0426 03/03/16 0412  Weight: 168 lb 9.6 oz (76.5 kg) 168 lb 3.2 oz (76.3 kg) 171 lb 12.8 oz (77.9 kg)    Physical Exam    [id number] nourished, well developed, in no acute distress.  HEENT:Grossly normal.  Neck:Supple, no JVD, carotid bruits, or masses. Cardiac:RRR, no murmurs, rubs, or gallops. No clubbing, cyanosis, edema.  Respiratory:Clear b/l. [id number], nontender, nondistended MS:no deformity or atrophy. Skin:warm and dry, no rash. Seborrheic keratoses on back. Neuro:Strength intact. Psych:AAOx3. Normal affect.  Labs    CBC  Recent Labs  03/02/16 1955  WBC 10.7*  HGB 11.3*  HCT 34.9*  MCV 98.0  PLT [id number]   Basic Metabolic Panel  Recent Labs  03/04/16 0702 03/05/16 0210  NA 136 137  K 3.5 4.1  CL 104 104  CO2 23 23  GLUCOSE 96 110*  BUN 24* 29*  CREATININE 0.95 1.12*  CALCIUM 9.3 9.3   Liver Function Tests No results for input(s): AST, ALT, ALKPHOS, BILITOT, PROT, ALBUMIN in the last 72 hours. No results for input(s): LIPASE, AMYLASE in the last 72 hours. Cardiac Enzymes No results for input(s): CKTOTAL,  CKMB, CKMBINDEX, TROPONINI in the last 72 hours. BNP Invalid input(s): POCBNP D-Dimer No results for input(s): DDIMER in the last 72 hours. Hemoglobin A1C No results for input(s): HGBA1C in the last 72 hours. Fasting Lipid Panel No results for input(s): CHOL, HDL, LDLCALC, TRIG, CHOLHDL, LDLDIRECT in the last 72 hours. Thyroid Function Tests No results for input(s): TSH, T4TOTAL, T3FREE, THYROIDAB in the last 72 hours.  Invalid input(s): FREET3  Telemetry    NSR with PVC's  - Personally Reviewed  ECG     - Personally Reviewed  Radiology    Dg Chest 2 View  Result Date: 03/03/2016 CLINICAL DATA:  history of COPD, HTN, and breast cancer. She presented to the ED on 02/28/16 with SOB, found to have elevated troponin so Cardiology was consulted. Stress test showed inferolateral reversible ischemia. Pt states she feels fine n.*comment  was truncated* EXAM: CHEST  2 VIEW COMPARISON:  02/28/2016 FINDINGS: Heart is enlarged. There are Kerley B-lines consistent with interstitial pulmonary edema. Pulmonary nodules identified on recent chest x-ray and chest CT are less well seen likely due to edema. No focal consolidations are identified. Small pleural effusions are present. Status post left mastectomy and axillary node dissection. IMPRESSION: Pulmonary edema similar in appearance to prior study. Electronically Signed   By: Nolon Nations M.D.   On: 03/03/2016 12:13    Cardiac Studies   Cath reviewed  Moderate disease in the distal one third of the LAD and the apex.  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 There are also well formed left to right coronary collaterals.  Patient Profile     Ms. 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 She presented to the ED on 02/28/16 with SOB, found to have elevated troponin so Cardiology was consulted. Stress test showed inferolateral reversible ischemia, and Echo with reduced EF and wall motion abnormality. Coronary angiography performed 03/02/16.   Assessment & Plan    1. NSTEMI/CAD: Medical therapy recommended. Currently on ASA, Toprol-XL, Plavix, statin, low dose lisinopril and nitrates. Imdur may have to be stopped if BP gets too low in the future.  2. COPD exacerbation: Improved with antibiotics. Per primary team.   3. Essential HTN: Well controlled. Continue low-dose lisinopril 2.5 mg for LV dysfunction and NSTEMI. Imdur may have to be stopped if BP gets too low in the future.  4. Acute systolic heart failure/severe LV dysfunction/ischemic cardiomyopathy: Chest xray 12/30 showed pulmonary edema. I gave one dose 40 mg IV Lasix yesterday with 1.73 L output. EF 35%.  I switched metoprolol tartrate to succinate given LV dysfunction on 12/30. added low-dose lisinopril 2.5 mg yesterday.  LVEF will need to be reassessed in the next several months after meds have been optimized to see if AICD is indicated. Discharge on Lasix 40 mg daily and check BMET later this week.  Dispo: She can be discharged today. I have arranged for outpatient follow up in our office.  Signed, Kate Sable, MD  03/05/2016, 8:40 AM
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
## 2016-03-06 ENCOUNTER — Telehealth: Payer: Self-pay | Admitting: *Deleted

## 2016-03-06 ENCOUNTER — Encounter (HOSPITAL_COMMUNITY): Payer: Self-pay | Admitting: Interventional Cardiology

## 2016-03-06 NOTE — Telephone Encounter (Signed)
Patient contacted regarding discharge from [facility] on 03/06/15.  Patient understands to follow up with provider Kerin Ransom on 1/10 at 11:30a at [facility]. Patient understands discharge instructions? Yes Patient understands medications and regimen? Yes Patient understands to bring all medications to this visit? Yes

## 2016-03-06 NOTE — Telephone Encounter (Signed)
Spoke to [place] and got suggestions for Dr. Kennon Holter preferred primary providers. Gave pt contact information for these. She's aware to call back if she needs anything else.

## 2016-03-06 NOTE — Telephone Encounter (Signed)
-----   Message from Nuala Alpha, LPN sent at [id number]  8:37 AM EST ----- Regarding: tcm pt   ----- Message ----- From: Rogelia Mire, NP Sent: 03/05/2016   9:35 AM To: [facility] Triage  Hi,  Ms. Kelly Benjamin is going home 1/1 following nstemi.  She has f/u with Lurena Joiner on 1/10.  Could you please call w/in next 48 hrs for TCM.  Thanks,  Gerald Stabs

## 2016-03-06 NOTE — Telephone Encounter (Signed)
New Message   Patient discharged from [facility], calling to get recommendation for Primary care giver.

## 2016-03-14 ENCOUNTER — Encounter: Payer: Self-pay | Admitting: Cardiology

## 2016-03-14 ENCOUNTER — Ambulatory Visit (INDEPENDENT_AMBULATORY_CARE_PROVIDER_SITE_OTHER): Payer: Medicare PPO | Admitting: Cardiology

## 2016-03-14 VITALS — BP 115/68 | HR 68 | Ht 70.0 in | Wt 165.0 lb

## 2016-03-14 DIAGNOSIS — I2510 Atherosclerotic heart disease of native coronary artery without angina pectoris: Secondary | ICD-10-CM | POA: Diagnosis not present

## 2016-03-14 DIAGNOSIS — I255 Ischemic cardiomyopathy: Secondary | ICD-10-CM

## 2016-03-14 DIAGNOSIS — I5023 Acute on chronic systolic (congestive) heart failure: Secondary | ICD-10-CM

## 2016-03-14 DIAGNOSIS — Z79899 Other long term (current) drug therapy: Secondary | ICD-10-CM

## 2016-03-14 DIAGNOSIS — J438 Other emphysema: Secondary | ICD-10-CM

## 2016-03-14 DIAGNOSIS — I214 Non-ST elevation (NSTEMI) myocardial infarction: Secondary | ICD-10-CM | POA: Diagnosis not present

## 2016-03-14 MED ORDER — LOSARTAN POTASSIUM 25 MG PO TABS
25.0000 mg | ORAL_TABLET | Freq: Every day | ORAL | 4 refills | Status: DC
Start: 2016-03-14 — End: 2016-04-19
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
## 2016-03-14 NOTE — Assessment & Plan Note (Signed)
Pt presented to [facility] 02/28/16 with SOB (no chest pain) and ruled in for a NSTEMI.

## 2016-03-14 NOTE — Assessment & Plan Note (Signed)
Followed by PCP. She is on inhalers, not on O2 

## 2016-03-14 NOTE — Assessment & Plan Note (Addendum)
Severe 2V CAD with scar on Myoview and collaterals at cath- plan for medical Rx

## 2016-03-14 NOTE — Assessment & Plan Note (Signed)
Acute CHF when she presented 02/28/16

## 2016-03-14 NOTE — Patient Instructions (Signed)
MEDICATION CHANGE: STOP LISINOPRIL  START LOSARTAN 25 MG ONE TABLET BY MOUTH DAILY.  LABS -BMP----  1 WEEK AFTER STARTING LOSARTAN    Your physician recommends that you schedule a follow-up appointment in [place] [address]  IN April 2018---Your physician has requested that you have an echocardiogram. Echocardiography is a painless test that uses sound waves to create images of your heart. It provides your doctor with information about the size and shape of your heart and how well your heart's chambers and valves are working. This procedure takes approximately one hour. There are no restrictions for this procedure.   Your physician recommends that you schedule a follow-up appointment in [place] -TO FOLLOW UP ECHO RESULT      If you need a refill on your cardiac medications before your next appointment, please call your pharmacy.

## 2016-03-14 NOTE — Assessment & Plan Note (Signed)
EF 35% 

## 2016-03-28 LAB — BASIC METABOLIC PANEL
BUN: 19 mg/dL (ref 7–25)
CO2: 26 mmol/L (ref 20–31)
Calcium: 10.2 mg/dL (ref 8.6–10.4)
Chloride: 103 mmol/L (ref 98–110)
Creat: 1.11 mg/dL — ABNORMAL HIGH (ref 0.60–0.93)
Glucose, Bld: 95 mg/dL (ref 65–99)
Potassium: 4.6 mmol/L (ref 3.5–5.3)
Sodium: 139 mmol/L (ref 135–146)

## 2016-03-29 ENCOUNTER — Telehealth: Payer: Self-pay | Admitting: *Deleted

## 2016-03-29 NOTE — Telephone Encounter (Signed)
Called patient about results. Also reviewed recommendations from visit. She was supposed to get a call to get set up w [facility] for discussion of entresto. This hasn't happened yet so there may not have been a notification put into the system. Informed her I would send to scheduling to get this looked at.  Please advise - thanks.

## 2016-03-29 NOTE — Telephone Encounter (Signed)
-----   Message from Erlene Quan, [place] sent at 03/28/2016  7:45 AM EST ----- Please let pt know her labs looked good  LUKE KILROY PA-C 03/28/2016 7:45 AM

## 2016-03-29 NOTE — Telephone Encounter (Signed)
Routed to [facility]

## 2016-03-29 NOTE — Telephone Encounter (Signed)
From Luke's note:  Your physician recommends that you schedule a follow-up appointment in [place] [address]  IN April 2018---Your physician has requested that you have an echocardiogram. Echocardiography is a painless test that uses sound waves to create images of your heart. It provides your doctor with information about the size and shape of your heart and how well your heart's chambers and valves are working. This procedure takes approximately one hour. There are no restrictions for this procedure.     Your physician recommends that you schedule a follow-up appointment in [place] -TO FOLLOW UP ECHO RESULT

## 2016-03-29 NOTE — Telephone Encounter (Signed)
Please ask Kelly Benjamin to schedule appt for [facility]

## 2016-04-01 ENCOUNTER — Other Ambulatory Visit: Payer: Self-pay | Admitting: Cardiology

## 2016-04-13 ENCOUNTER — Telehealth: Payer: Self-pay | Admitting: *Deleted

## 2016-04-13 NOTE — Telephone Encounter (Signed)
Left message for patient to call and schedule Pharm D appointment to discuss [facility]

## 2016-04-19 ENCOUNTER — Other Ambulatory Visit: Payer: Self-pay | Admitting: Pharmacist

## 2016-04-19 ENCOUNTER — Ambulatory Visit (INDEPENDENT_AMBULATORY_CARE_PROVIDER_SITE_OTHER): Payer: Medicare PPO | Admitting: Pharmacist

## 2016-04-19 VITALS — BP 122/58 | HR 64

## 2016-04-19 DIAGNOSIS — I5023 Acute on chronic systolic (congestive) heart failure: Secondary | ICD-10-CM

## 2016-04-19 MED ORDER — SACUBITRIL-VALSARTAN 24-26 MG PO TABS: 1.0000 | ORAL_TABLET | Freq: Two times a day (BID) | ORAL | Status: DC

## 2016-04-19 NOTE — Progress Notes (Signed)
Patient ID: CHANEY Benjamin                 DOB: [birth year]                      MRN: [id number]     HPI: Kelly Benjamin is a 76 y.o. female patient of Dr Kelly Benjamin referred by Kelly Ransom PA-C to pharmacist clinic for [facility] initiation and titration.  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  Patient presents today for initial evaluation. Plan to titrate Entresto to high dose of 97/103mg  or highest tolerated dose.  Patient denies problems, headaches or dizziness. Tolerating losartan 25mg  daily without problems. Baseline BMET completed 3 weeks ago with Scr = 1.11 and K = 4.6.  Current HF meds:  Losartan 25mg  daily Metoprolol succinate 35m daily Furosemide 40mg  daily  Social History: former smoker; occasional alcohol use  Diet: low sodium, ,low fat diet  Exercise: sedentary at the moment  Home BP readings: no records avaialble  Wt Readings from Last 3 Encounters:  03/14/16 165 lb (74.8 kg)  03/03/16 171 lb 12.8 oz (77.9 kg)   BP Readings from Last 3 Encounters:  04/19/16 (!) 122/58  03/14/16 115/68  03/05/16 113/65   Pulse Readings from Last 3 Encounters:  04/19/16 64  03/14/16 68  03/05/16 68     Past Medical History:  Diagnosis Date  . Breast cancer ([place])   . COPD (chronic obstructive pulmonary disease) ([place])   . Hypercholesteremia   . Hypertension     Current Outpatient Prescriptions on File Prior to Visit  Medication Sig Dispense Refill  . aspirin EC 81 MG tablet Take 1 tablet (81 mg total) by mouth daily. 30 tablet 2  . atorvastatin (LIPITOR) 40 MG tablet TAKE 1 TABLET BY MOUTH EVERY DAY AT 6PM 30 tablet 11  . clopidogrel (PLAVIX) 75 MG tablet TAKE 1 TABLET BY MOUTH EVERY DAY 30 tablet 11  . fluticasone furoate-vilanterol (BREO ELLIPTA) 200-25 MCG/INH AEPB Inhale 1 puff into the lungs daily. 1 each 0  . furosemide (LASIX) 40 MG tablet TAKE 1 TABLET BY MOUTH EVERY DAY 30 tablet 11  . isosorbide mononitrate (IMDUR) 30 MG 24 hr tablet TAKE 1 TABLET  BY MOUTH EVERY DAY 30 tablet 11  . metoprolol succinate (TOPROL-XL) 25 MG 24 hr tablet TAKE 1 TABLET BY MOUTH EVERY DAY 30 tablet 11  . Multiple Vitamins-Minerals (ICAPS AREDS 2) CAPS Take 1 capsule by mouth 2 (two) times daily.    Marland Kitchen SPIRIVA HANDIHALER 18 MCG inhalation capsule Take 18 mcg by mouth daily.      No current facility-administered medications on file prior to visit.     Allergies  Allergen Reactions  . Sulfa Antibiotics Swelling    Blood pressure (!) 122/58, pulse 64, SpO2 95 %.  Entresto:  Patient blood pressure stable a 122/58 with pulse of 64 bpm.  Currently at low dose losartan 25mg ; therefore appropriate to initiate Entresto 24/26mg  twice daily.  Patient needs washout period of 36 hours from last dose of losartan taken this morning.  Baseline BMET showed potassium level within normal limits and SCR - 1.1 with est. CrCl = 47 ml/min (Cockcroft gault formula).  Will discontinue losartan today, provide 14 days starter sample of Entresto 24/26mg  with 1st dose 04/21/16 in the morning and repeat BMET in 2 weeks.  Follow up appointment also in 2 weeks to monitor BP and titrate Entresto to next dose of 49/51mg  if  possible.   Perlita Forbush Rodriguez-Guzman PharmD, [place] [id number] 04/19/2016 10:50 PM

## 2016-04-19 NOTE — Patient Instructions (Signed)
Return for a follow up appointment in 2 weeks  Your blood pressure today is 122/58 pulse 64  Check your blood pressure at home daily (if able) and keep record of the readings.  Take your BP meds as follows:  **STOP taking losartan 25mg  (last dose today 04/19/16) **Start Entresto 24mg /26mg  1 tablet twice daily (1 st dose 04/21/16)  **Repeat blood work on [employer] all of your meds, your BP cuff and your record of home blood pressures to your next appointment.  Exercise as you're able, try to walk approximately 30 minutes per day.  Keep salt intake to a minimum, especially watch canned and prepared boxed foods.  Eat more fresh fruits and vegetables and fewer canned items.  Avoid eating in fast food restaurants.    HOW TO TAKE YOUR BLOOD PRESSURE: . Rest 5 minutes before taking your blood pressure. .  Don't smoke or drink caffeinated beverages for at least 30 minutes before. . Take your blood pressure before (not after) you eat. . Sit comfortably with your back supported and both feet on the floor (don't cross your legs). . Elevate your arm to heart level on a table or a desk. . Use the proper sized cuff. It should fit smoothly and snugly around your bare upper arm. There should be enough room to slip a fingertip under the cuff. The bottom edge of the cuff should be 1 inch above the crease of the elbow. . Ideally, take 3 measurements at one sitting and record the average.

## 2016-05-03 ENCOUNTER — Other Ambulatory Visit: Payer: Self-pay | Admitting: Cardiovascular Disease

## 2016-05-03 ENCOUNTER — Ambulatory Visit (INDEPENDENT_AMBULATORY_CARE_PROVIDER_SITE_OTHER): Payer: Medicare PPO | Admitting: Pharmacist

## 2016-05-03 VITALS — BP 110/68 | HR 67

## 2016-05-03 DIAGNOSIS — I5023 Acute on chronic systolic (congestive) heart failure: Secondary | ICD-10-CM

## 2016-05-03 LAB — BASIC METABOLIC PANEL
BUN: 20 mg/dL (ref 7–25)
CALCIUM: 9.8 mg/dL (ref 8.6–10.4)
CHLORIDE: 103 mmol/L (ref 98–110)
CO2: 28 mmol/L (ref 20–31)
CREATININE: 1.06 mg/dL — AB (ref 0.60–0.93)
Glucose, Bld: 84 mg/dL (ref 65–99)
Potassium: 3.6 mmol/L (ref 3.5–5.3)
Sodium: 141 mmol/L (ref 135–146)

## 2016-05-03 MED ORDER — SACUBITRIL-VALSARTAN 49-51 MG PO TABS: 1.0000 | ORAL_TABLET | Freq: Two times a day (BID) | ORAL | 0 refills | Status: DC

## 2016-05-03 NOTE — Progress Notes (Signed)
Patient ID: Kelly Benjamin                 DOB: 1940/07/16                      MRN: [id number]     HPI: Kelly Benjamin is a 76 y.o. female patient of Dr Gwenlyn Found referred by Kerin Ransom PA-C to pharmacist clinic for [facility] initiation and titration.  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    Losartan was discontinued and Entresto 24/26 initiated on 04/19/2016.   Baseline BMET completed on 03/27/16 with Scr = 1.11 and K = 4.6. Patient denies problems, headaches or dizziness.  Also reports home BO reading are stable and she feels well.  Follow up BMET done today prior to office visit and results not available yet.  Current HF meds:  Entresto 24/26mg  twice daily Metoprolol succinate 42m daily Furosemide 40mg  daily  Social History: former smoker; occasional alcohol use  Diet: low sodium, ,low fat diet  Exercise: sedentary at the moment  Wt Readings from Last 3 Encounters:  03/14/16 165 lb (74.8 kg)  03/03/16 171 lb 12.8 oz (77.9 kg)   BP Readings from Last 3 Encounters:  05/03/16 110/68  04/19/16 (!) 122/58  03/14/16 115/68   Pulse Readings from Last 3 Encounters:  05/03/16 67  04/19/16 64  03/14/16 68    Past Medical History:  Diagnosis Date  . Breast cancer ([place])   . COPD (chronic obstructive pulmonary disease) ([place])   . Hypercholesteremia   . Hypertension     Current Outpatient Prescriptions on File Prior to Visit  Medication Sig Dispense Refill  . aspirin EC 81 MG tablet Take 1 tablet (81 mg total) by mouth daily. 30 tablet 2  . atorvastatin (LIPITOR) 40 MG tablet TAKE 1 TABLET BY MOUTH EVERY DAY AT 6PM 30 tablet 11  . clopidogrel (PLAVIX) 75 MG tablet TAKE 1 TABLET BY MOUTH EVERY DAY 30 tablet 11  . fluticasone furoate-vilanterol (BREO ELLIPTA) 200-25 MCG/INH AEPB Inhale 1 puff into the lungs daily. 1 each 0  . furosemide (LASIX) 40 MG tablet TAKE 1 TABLET BY MOUTH EVERY DAY 30 tablet 11  . isosorbide mononitrate (IMDUR) 30 MG 24 hr tablet TAKE 1  TABLET BY MOUTH EVERY DAY 30 tablet 11  . metoprolol succinate (TOPROL-XL) 25 MG 24 hr tablet TAKE 1 TABLET BY MOUTH EVERY DAY 30 tablet 11  . Multiple Vitamins-Minerals (ICAPS AREDS 2) CAPS Take 1 capsule by mouth 2 (two) times daily.    Marland Kitchen SPIRIVA HANDIHALER 18 MCG inhalation capsule Take 18 mcg by mouth daily.      No current facility-administered medications on file prior to visit.     Allergies  Allergen Reactions  . Sulfa Antibiotics Swelling    Blood pressure 110/68, pulse 67, SpO2 97 %.  Entresto Titration:  Patient blood pressure remains stable at 110/68 with pulse of 67 bpm.  Baseline est. CrCl was 47 ml/min (Cockcroft gault formula) and patient is tolerating medication without problems. Will increase Entresto dose to 49/51 today (Sampled for 14 days treatment provided) and f/u BMET results to adjust therapy if/as needed.  Next F/U visit scheduled in 2 weeks to titrate Entresto to higher dose if possible.     Quintrell Baze Rodriguez-Guzman PharmD, [place] [id number] 05/03/2016 5:29 PM

## 2016-05-09 ENCOUNTER — Telehealth: Payer: Self-pay | Admitting: Cardiovascular Disease

## 2016-05-09 NOTE — Telephone Encounter (Signed)
Basic metabolic panel  Order: [id number]  Status:  Final result Visible to patient:  No (Not Released)  Notes Recorded by Therisa Doyne on 05/09/2016 at 3:45 PM EST Results given to pt. Pt verbalized understanding. Pt has appt for Entresto titration with PharmD on 05/17/16. ------  Notes Recorded by Lorretta Harp, MD on 05/04/2016 at 7:56 AM EST Call and tell normal

## 2016-05-17 ENCOUNTER — Ambulatory Visit (INDEPENDENT_AMBULATORY_CARE_PROVIDER_SITE_OTHER): Payer: Medicare PPO | Admitting: Pharmacist Clinician (PhC)/ Clinical Pharmacy Specialist

## 2016-05-17 DIAGNOSIS — I5023 Acute on chronic systolic (congestive) heart failure: Secondary | ICD-10-CM

## 2016-05-17 NOTE — Progress Notes (Signed)
Patient ID: Kelly Benjamin                 DOB: 1940/09/30                      MRN: [id number]     HPI: Kelly Benjamin is a 76 y.o. female patient of Dr Gwenlyn Found referred by Kerin Ransom PA-C to pharmacist clinic for [facility] titration.  PMH is significant for COPD, NSTEMI, CAD and HF class III (EF 35%).    Losartan was discontinued and Entresto 24/26 initiated on 04/19/2016.  On March 1 the [name] was again increased to the 49/51 mg dose.  Patient returns today for follow up.   Baseline BMET completed on 03/27/16 with Scr = 1.11 and K = 4.6. Patient denies problems, headaches or dizziness.  Also reports home BP reading are stable and she feels well.    Noted some dizziness/lightheadedness with initial use of Entresto, lasted only few days, but nothing noticeable with dose increase to 49/51 mg dose.    Home BP readings are mostly 656-812 systolic with diastolic rarely as high as 70.    Labs:    03/27/16 - SCr 1.11, K 4.6  05/03/16  - SCr 1.06, K 3.6.  Family history: father died from MI at age 81; mother died from old age (late 10's); several siblings had/have hypertension (she was one of 8)   Current HF meds:  Entresto 49/51 mg twice daily Metoprolol succinate 25 mg daily Furosemide 40 mg daily  Social History: former smoker; occasional alcohol use; 2 cups of coffee per day with breakfast  Diet: low sodium, ,low fat diet  Exercise: sedentary at the moment  Wt Readings from Last 3 Encounters:  03/14/16 165 lb (74.8 kg)  03/03/16 171 lb 12.8 oz (77.9 kg)   BP Readings from Last 3 Encounters:  05/03/16 110/68  04/19/16 (!) 122/58  03/14/16 115/68   Pulse Readings from Last 3 Encounters:  05/03/16 67  04/19/16 64  03/14/16 68    Past Medical History:  Diagnosis Date  . Breast cancer ([place])   . COPD (chronic obstructive pulmonary disease) ([place])   . Hypercholesteremia   . Hypertension     Current Outpatient Prescriptions on File Prior to Visit  Medication Sig Dispense Refill   . aspirin EC 81 MG tablet Take 1 tablet (81 mg total) by mouth daily. 30 tablet 2  . atorvastatin (LIPITOR) 40 MG tablet TAKE 1 TABLET BY MOUTH EVERY DAY AT 6PM 30 tablet 11  . clopidogrel (PLAVIX) 75 MG tablet TAKE 1 TABLET BY MOUTH EVERY DAY 30 tablet 11  . fluticasone furoate-vilanterol (BREO ELLIPTA) 200-25 MCG/INH AEPB Inhale 1 puff into the lungs daily. 1 each 0  . furosemide (LASIX) 40 MG tablet TAKE 1 TABLET BY MOUTH EVERY DAY 30 tablet 11  . isosorbide mononitrate (IMDUR) 30 MG 24 hr tablet TAKE 1 TABLET BY MOUTH EVERY DAY 30 tablet 11  . metoprolol succinate (TOPROL-XL) 25 MG 24 hr tablet TAKE 1 TABLET BY MOUTH EVERY DAY 30 tablet 11  . Multiple Vitamins-Minerals (ICAPS AREDS 2) CAPS Take 1 capsule by mouth 2 (two) times daily.    . sacubitril-valsartan (ENTRESTO) 49-51 MG Take 1 tablet by mouth 2 (two) times daily. 28 tablet 0  . SPIRIVA HANDIHALER 18 MCG inhalation capsule Take 18 mcg by mouth daily.      No current facility-administered medications on file prior to visit.     Allergies  Allergen  Reactions  . Sulfa Antibiotics Swelling    BP 104/56, HR 72  Entresto Titration:   Patient with CHF and EF of 35%, doing well with Entresto 49/51.  Her blood pressure has been stable and unchanged since starting the lower dose, with home systolic readings between 96-118.  Diastolic readings mostly 72-09.   She will increase her Entresto, but I will have her take 2 of the 49/51 mg tabs for about a week, to be sure that her blood pressure continues to stay stable.  She is to call within 7-10 days and we can call in the correct dose to her pharmacy at that time.  Raquel Rodriguez-Guzman PharmD, [place] [address] 05/17/2016 1:57 PM

## 2016-05-17 NOTE — Patient Instructions (Signed)
Increase Entresto 49/51 to 2 tablets twice daily.   If you develop any symptoms of lightheadedness or dizziness, please check you BP.    Call in 7-10 days with a report of how you are feeling on the higher dose.  If your BP stays stable we will call in the prescription for the 97/103 mg dose.    If you feel worse before the 7-10 days, please call right away  Anush Wiedeman/Raquel [phone]

## 2016-05-17 NOTE — Assessment & Plan Note (Addendum)
Patient with CHF and EF of 35%, doing well with Entresto 49/51.  Her blood pressure has been stable and unchanged since starting the lower dose, with home systolic readings between 96-118.  Diastolic readings mostly 16-10.   She will increase her Entresto, but I will have her take 2 of the 49/51 mg tabs for about a week, to be sure that her blood pressure continues to stay stable.  She is to call within 7-10 days and we can call in the correct dose to her pharmacy at that time.

## 2016-05-28 ENCOUNTER — Telehealth: Payer: Self-pay | Admitting: Pharmacist

## 2016-05-28 MED ORDER — SACUBITRIL-VALSARTAN 97-103 MG PO TABS: 1.0000 | ORAL_TABLET | Freq: Two times a day (BID) | ORAL | 11 refills | Status: DC

## 2016-05-28 NOTE — Telephone Encounter (Signed)
Received called from Kelly Benjamin. She is tolerating Entresto high dose without problems. No ADR noted and BP stable at :  116/62 HR 66 120/70 HR 71 112/56 HR63 123/64 HR 72  **New Rx for Entresto 97/103 sent to prefer pharmacy and 30 day free trial provided to patient as well.

## 2016-06-04 ENCOUNTER — Telehealth: Payer: Self-pay | Admitting: Cardiovascular Disease

## 2016-06-04 NOTE — Telephone Encounter (Signed)
New message      Pt c/o medication issue:  1. Name of Medication:  entresto 2. How are you currently taking this medication (dosage and times per day)?   3. Are you having a reaction (difficulty breathing--STAT)?  no 4. What is your medication issue? Calling to see if we received all of the paperwork regarding this medication. Please refer to reference number [id number]

## 2016-06-05 ENCOUNTER — Telehealth: Payer: Self-pay | Admitting: Cardiovascular Disease

## 2016-06-05 MED ORDER — SACUBITRIL-VALSARTAN 97-103 MG PO TABS: 1.0000 | ORAL_TABLET | Freq: Two times a day (BID) | ORAL | 1 refills | Status: DC

## 2016-06-05 NOTE — Telephone Encounter (Signed)
Called pt to inform her that the [name] was approved. Sending in new Rx to [facility] on [address] for 90 day supply (180 tab w/ 1 refill--6 months) per pt request.

## 2016-06-05 NOTE — Telephone Encounter (Signed)
Completed PA for Entresto.  Case ID#: [id number]  Will be notified within 24 hours if it has been approved or denied.

## 2016-06-20 ENCOUNTER — Other Ambulatory Visit: Payer: Self-pay

## 2016-06-20 ENCOUNTER — Ambulatory Visit (HOSPITAL_COMMUNITY): Payer: Medicare PPO | Attending: Cardiovascular Disease

## 2016-06-20 DIAGNOSIS — I5023 Acute on chronic systolic (congestive) heart failure: Secondary | ICD-10-CM | POA: Diagnosis not present

## 2016-06-20 DIAGNOSIS — I255 Ischemic cardiomyopathy: Secondary | ICD-10-CM | POA: Diagnosis not present

## 2016-06-20 DIAGNOSIS — I340 Nonrheumatic mitral (valve) insufficiency: Secondary | ICD-10-CM | POA: Insufficient documentation

## 2016-06-20 DIAGNOSIS — I214 Non-ST elevation (NSTEMI) myocardial infarction: Secondary | ICD-10-CM | POA: Diagnosis not present

## 2016-06-20 DIAGNOSIS — I517 Cardiomegaly: Secondary | ICD-10-CM | POA: Insufficient documentation

## 2016-06-20 DIAGNOSIS — I361 Nonrheumatic tricuspid (valve) insufficiency: Secondary | ICD-10-CM | POA: Diagnosis not present

## 2016-06-20 DIAGNOSIS — I5030 Unspecified diastolic (congestive) heart failure: Secondary | ICD-10-CM | POA: Diagnosis not present

## 2016-06-29 ENCOUNTER — Ambulatory Visit (INDEPENDENT_AMBULATORY_CARE_PROVIDER_SITE_OTHER): Payer: Medicare PPO | Admitting: Cardiovascular Disease

## 2016-06-29 ENCOUNTER — Encounter: Payer: Self-pay | Admitting: Cardiovascular Disease

## 2016-06-29 VITALS — BP 130/62 | HR 56 | Ht 70.0 in | Wt 160.0 lb

## 2016-06-29 DIAGNOSIS — I214 Non-ST elevation (NSTEMI) myocardial infarction: Secondary | ICD-10-CM | POA: Diagnosis not present

## 2016-06-29 DIAGNOSIS — I255 Ischemic cardiomyopathy: Secondary | ICD-10-CM

## 2016-06-29 DIAGNOSIS — J410 Simple chronic bronchitis: Secondary | ICD-10-CM

## 2016-06-29 DIAGNOSIS — E7800 Pure hypercholesterolemia, unspecified: Secondary | ICD-10-CM | POA: Diagnosis not present

## 2016-06-29 NOTE — Patient Instructions (Signed)
Medication Instructions: Your physician recommends that you continue on your current medications as directed. Please refer to the Current Medication list given to you today.  Labwork: Your physician recommends that you return for a FASTING lipid profile and hepatic function panel.   Follow-Up: We request that you follow-up in: 6 months with Kerin Ransom, PA-C and in 12 months with Dr Andria Rhein will receive a reminder letter in the mail two months in advance. If you don't receive a letter, please call our office to schedule the follow-up appointment.  If you need a refill on your cardiac medications before your next appointment, please call your pharmacy.

## 2016-06-29 NOTE — Assessment & Plan Note (Signed)
History of ischemic cardiomyopathy with an EF by recent echo on 06/20/16 of 40-45%, improved from 30-35% reading at the time of cath 4 months ago. She feels clinically improved as well on optimal medical therapy.

## 2016-06-29 NOTE — Progress Notes (Signed)
06/29/2016 Kelly Benjamin   1941-01-03  [id number]  Primary Physician WEBB, Valla Leaver, MD Primary Cardiologist: Lorretta Harp MD Renae Gloss  HPI:  Ms. Holsworth is a 76 year old single Caucasian female with no children who is a retired Radio producer in [employer]. I apparently saw her during her recent hospitalization in the end of December. She has a history of hypertension and hyperlipidemia as well as a long history of tobacco abuse having smoked 100 pack years and stopped 2 years ago. She was admitted on 02/28/16 with a COPD flare and elevated BNP and mild troponin leak. She had a 2-D echo which revealed an EF of 35% with anterolateral wall motion abnormality and a Myoview on 03/01/16 that showed anterior apical lateral scarring. Subsequent cardiac catheterization performed the following day by Dr. Tamala Julian on 03/02/16 showed a 90% proximal large RCA stenosis with left-to-right collaterals from a small circumflex and another 85% distal LAD stenosis. Medical therapy was recommended. Recent echo performed 06/20/16 showed improvement in LV function with an EF of 40-45%. She feels clinically improved on optimal medical therapy.   Current Outpatient Prescriptions  Medication Sig Dispense Refill  . aspirin EC 81 MG tablet Take 1 tablet (81 mg total) by mouth daily. 30 tablet 2  . atorvastatin (LIPITOR) 40 MG tablet TAKE 1 TABLET BY MOUTH EVERY DAY AT 6PM 30 tablet 11  . clopidogrel (PLAVIX) 75 MG tablet TAKE 1 TABLET BY MOUTH EVERY DAY 30 tablet 11  . fluticasone furoate-vilanterol (BREO ELLIPTA) 200-25 MCG/INH AEPB Inhale 1 puff into the lungs daily. 1 each 0  . furosemide (LASIX) 40 MG tablet TAKE 1 TABLET BY MOUTH EVERY DAY 30 tablet 11  . isosorbide mononitrate (IMDUR) 30 MG 24 hr tablet TAKE 1 TABLET BY MOUTH EVERY DAY 30 tablet 11  . metoprolol succinate (TOPROL-XL) 25 MG 24 hr tablet TAKE 1 TABLET BY MOUTH EVERY DAY 30 tablet 11  . Multiple Vitamins-Minerals  (ICAPS AREDS 2) CAPS Take 1 capsule by mouth 2 (two) times daily.    . sacubitril-valsartan (ENTRESTO) 97-103 MG Take 1 tablet by mouth 2 (two) times daily. 180 tablet 1  . SPIRIVA HANDIHALER 18 MCG inhalation capsule Take 18 mcg by mouth daily.      No current facility-administered medications for this visit.     Allergies  Allergen Reactions  . Sulfa Antibiotics Swelling    Social History   Social History  . Marital status: Single    Spouse name: N/A  . Number of children: N/A  . Years of education: N/A   Occupational History  . Not on file.   Social History Main Topics  . Smoking status: Former Research scientist (life sciences)  . Smokeless tobacco: Former Systems developer  . Alcohol use Yes     Comment: occassionally  . Drug use: No  . Sexual activity: Not on file   Other Topics Concern  . Not on file   Social History Narrative  . No narrative on file     Review of Systems: General: negative for chills, fever, night sweats or weight changes.  Cardiovascular: negative for chest pain, dyspnea on exertion, edema, orthopnea, palpitations, paroxysmal nocturnal dyspnea or shortness of breath Dermatological: negative for rash Respiratory: negative for cough or wheezing Urologic: negative for hematuria Abdominal: negative for nausea, vomiting, diarrhea, bright red blood per rectum, melena, or hematemesis Neurologic: negative for visual changes, syncope, or dizziness All other systems reviewed and are otherwise negative except as noted above.  Blood pressure 130/62, pulse (!) 56, height 5\' 10"  (1.778 m), weight 160 lb (72.6 kg).  General appearance: alert and no distress Neck: no adenopathy, no carotid bruit, no JVD, supple, symmetrical, trachea midline and thyroid not enlarged, symmetric, no tenderness/mass/nodules Lungs: clear to auscultation bilaterally Heart: regular rate and rhythm, S1, S2 normal, no murmur, click, rub or gallop Extremities: extremities normal, atraumatic, no cyanosis or  edema  EKG not performed today  ASSESSMENT AND PLAN:   COPD (chronic obstructive pulmonary disease) (HCC) History of COPD with a long history of tobacco abuse (100 pack years) having stopped 2 years ago.  Non-STEMI (non-ST elevated myocardial infarction) [facility]) History of non-STEMI with recent cardiac catheterization performed by Dr. Tamala Julian 03/02/16 revealed a 90% proximal RCA stenosis, 85% apical LAD stenosis. There were circumflex to distal right coronary collaterals with an EF of 30-35% at that time. Medical therapy was recommended. She's had no recurrent chest pain.  Ischemic cardiomyopathy History of ischemic cardiomyopathy with an EF by recent echo on 06/20/16 of 40-45%, improved from 30-35% reading at the time of cath 4 months ago. She feels clinically improved as well on optimal medical therapy.      Lorretta Harp MD FACP,FACC,FAHA, [facility] 06/29/2016 3:24 PM

## 2016-06-29 NOTE — Assessment & Plan Note (Signed)
History of COPD with a long history of tobacco abuse (100 pack years) having stopped 2 years ago.

## 2016-06-29 NOTE — Assessment & Plan Note (Signed)
History of non-STEMI with recent cardiac catheterization performed by Dr. Tamala Julian 03/02/16 revealed a 90% proximal RCA stenosis, 85% apical LAD stenosis. There were circumflex to distal right coronary collaterals with an EF of 30-35% at that time. Medical therapy was recommended. She's had no recurrent chest pain.

## 2016-07-02 LAB — LIPID PANEL
CHOL/HDL RATIO: 4.1 ratio (ref <5.0)
Cholesterol: 150 mg/dL (ref <200)
HDL: 37 mg/dL — ABNORMAL LOW (ref >50)
LDL CALC: 94 mg/dL (ref <100)
Triglycerides: 94 mg/dL (ref <150)
VLDL: 19 mg/dL (ref <30)

## 2016-07-02 LAB — HEPATIC FUNCTION PANEL
ALT: 13 U/L (ref 6–29)
AST: 15 U/L (ref 10–35)
Albumin: 3.9 g/dL (ref 3.6–5.1)
Alkaline Phosphatase: 65 U/L (ref 33–130)
BILIRUBIN DIRECT: 0.1 mg/dL (ref <=0.2)
BILIRUBIN INDIRECT: 0.3 mg/dL (ref 0.2–1.2)
TOTAL PROTEIN: 6.4 g/dL (ref 6.1–8.1)
Total Bilirubin: 0.4 mg/dL (ref 0.2–1.2)

## 2016-07-03 ENCOUNTER — Other Ambulatory Visit: Payer: Self-pay | Admitting: Cardiovascular Disease

## 2016-07-03 ENCOUNTER — Telehealth: Payer: Self-pay | Admitting: Cardiovascular Disease

## 2016-07-03 DIAGNOSIS — E7800 Pure hypercholesterolemia, unspecified: Secondary | ICD-10-CM

## 2016-07-03 NOTE — Telephone Encounter (Signed)
Lipid panel  Order: [id number]  Status:  Final result Visible to patient:  No (Not Released) Dx:  Hypercholesterolemia  Notes recorded by Therisa Doyne on 07/03/2016 at 10:56 AM EDT Results given to pt, changes made to pt med list, and lab order slips mailed to pt. Pt verbalized understanding. ------  Notes recorded by Lorretta Harp, MD on 07/03/2016 at 5:43 AM EDT Increase atorva to 80 and recheck

## 2016-07-09 ENCOUNTER — Telehealth: Payer: Self-pay | Admitting: Cardiovascular Disease

## 2016-07-09 NOTE — Telephone Encounter (Signed)
Returned call to patient.Stated she cannot take Lipitor 80 mg causes leg pain.Stated she had no leg pain with 40 mg.Advised to take Lipitor 40 mg daily.I will send message to [facility] for advice.

## 2016-07-09 NOTE — Telephone Encounter (Signed)
New Message  Pt c/o medication issue:  1. Name of Medication: lipitor  2. How are you currently taking this medication (dosage and times per day)? 40mg   3. Are you having a reaction (difficulty breathing--STAT)? Leg pain. More in the right but some in the left leg as well.  4. What is your medication issue? Per pt would like to change medication from Lipitor to another med because of the pain in legs. Please call back to discuss

## 2016-07-10 MED ORDER — EZETIMIBE 10 MG PO TABS: 10.0000 mg | ORAL_TABLET | Freq: Every day | ORAL | 0 refills | Status: DC

## 2016-07-10 NOTE — Telephone Encounter (Signed)
Pt notified.  Pharmacy verified

## 2016-07-10 NOTE — Telephone Encounter (Signed)
Add Zetia 10 mg a day and re check 2-3 moths

## 2016-07-24 ENCOUNTER — Other Ambulatory Visit: Payer: Self-pay | Admitting: Cardiovascular Disease

## 2016-07-24 LAB — HEPATIC FUNCTION PANEL
ALBUMIN: 3.9 g/dL (ref 3.6–5.1)
ALK PHOS: 63 U/L (ref 33–130)
ALT: 12 U/L (ref 6–29)
AST: 14 U/L (ref 10–35)
BILIRUBIN TOTAL: 0.4 mg/dL (ref 0.2–1.2)
Bilirubin, Direct: 0.1 mg/dL (ref <=0.2)
Indirect Bilirubin: 0.3 mg/dL (ref 0.2–1.2)
TOTAL PROTEIN: 6.2 g/dL (ref 6.1–8.1)

## 2016-07-24 LAB — LIPID PANEL
CHOL/HDL RATIO: 6.8 ratio — AB (ref <5.0)
Cholesterol: 225 mg/dL — ABNORMAL HIGH (ref <200)
HDL: 33 mg/dL — AB (ref >50)
LDL CALC: 156 mg/dL — AB (ref <100)
TRIGLYCERIDES: 179 mg/dL — AB (ref <150)
VLDL: 36 mg/dL — ABNORMAL HIGH (ref <30)

## 2016-07-25 ENCOUNTER — Telehealth: Payer: Self-pay | Admitting: Cardiovascular Disease

## 2016-07-25 MED ORDER — ATORVASTATIN CALCIUM 40 MG PO TABS: 40.0000 mg | ORAL_TABLET | Freq: Every day | ORAL | 3 refills | Status: DC

## 2016-07-25 NOTE — Telephone Encounter (Signed)
Patient returned Kelly Benjamin, [place] call in regards to labs Advised on results (below) Notes recorded by Lorretta Harp, MD on 07/24/2016 at 5:06 PM EDT Considerably worse than previous FLP. Is she taking her lipitor???  She states she was told to STOP atorvastatin when she was to start zetia 10mg  - removed from med list. Explained that Kelly Mood, LPN advised she decrease atorvastatin to 40mg  daily and then MD replied to add zetia 10mg  QD. Explained that she should've been notified that MD wanted her to recheck labs 2-3 months after this med change, however she states that she received a letter in the mail to have labs on 5/22 so she did as the letter stated. Apologized to patient for inconvenience. She was understanding. She states she will take atorvastatin 40mg  in addition to zetia 10mg . Rx(s) sent to pharmacy electronically. Routed to MD as Juluis Rainier  Copied below is tele encounter where med changes where made.Forde Dandy  (Newest Message First)  Jul 10, 2016  Waylan Rocher, LPN   4:31 AM  Note    Pt notified Pharmacy verified     Lorretta Harp, MD  to Christiana Pellant, Erik Obey, LPN     5:40 AM  Note    Add Zetia 10 mg a day and re check 2-3 moths    Jul 09, 2016        11:56 AM  Pugh, Eden Lathe, LPN routed this conversation to Lorretta Harp, MD . Aldean Jewett, LPN   08:67 AM  Note    Returned call to patient.Stated she cannot take Lipitor 80 mg causes leg pain.Stated she had no leg pain with 40 mg.Advised to take Lipitor 40 mg daily.I will send message to [facility] for advice.          11:54 AM    Pugh, Lucianne Muss D, LPN contacted Christiana Pellant        11:41 AM  Chase Picket N routed this conversation to [facility]  Tamera Reason   11:38 AM  Note    New Message  Pt c/o medication issue:  1. Name of Medication: lipitor  2. How are you currently taking this  medication (dosage and times per day)? 40mg   3. Are you having a reaction (difficulty breathing--STAT)? Leg pain. More in the right but some in the left leg as well.  4. What is your medication issue? Per pt would like to change medication from Lipitor to another med because of the pain in legs. Please call back to discuss

## 2016-07-25 NOTE — Telephone Encounter (Signed)
Per pt returning call and would like her test results she thinks it is for her lab work.

## 2016-09-28 ENCOUNTER — Other Ambulatory Visit: Payer: Self-pay | Admitting: Cardiovascular Disease

## 2016-09-28 DIAGNOSIS — Z1231 Encounter for screening mammogram for malignant neoplasm of breast: Secondary | ICD-10-CM

## 2016-10-05 ENCOUNTER — Other Ambulatory Visit: Payer: Self-pay | Admitting: Cardiovascular Disease

## 2016-10-15 ENCOUNTER — Ambulatory Visit
Admission: RE | Admit: 2016-10-15 | Discharge: 2016-10-15 | Disposition: A | Discharge status: Home / Self Care | Payer: Medicare PPO | Source: Ambulatory Visit | Attending: Cardiovascular Disease | Admitting: Cardiovascular Disease

## 2016-10-15 DIAGNOSIS — Z1231 Encounter for screening mammogram for malignant neoplasm of breast: Secondary | ICD-10-CM

## 2016-10-15 HISTORY — DX: Personal history of irradiation: Z92.3

## 2016-10-16 ENCOUNTER — Ambulatory Visit: Payer: Medicare PPO

## 2016-12-06 ENCOUNTER — Encounter: Payer: Self-pay | Admitting: Cardiology

## 2016-12-06 ENCOUNTER — Ambulatory Visit (INDEPENDENT_AMBULATORY_CARE_PROVIDER_SITE_OTHER): Payer: Medicare PPO | Admitting: Cardiology

## 2016-12-06 VITALS — BP 100/50 | HR 71 | Ht 70.0 in | Wt 164.0 lb

## 2016-12-06 DIAGNOSIS — I255 Ischemic cardiomyopathy: Secondary | ICD-10-CM | POA: Diagnosis not present

## 2016-12-06 DIAGNOSIS — Z79899 Other long term (current) drug therapy: Secondary | ICD-10-CM | POA: Diagnosis not present

## 2016-12-06 DIAGNOSIS — I252 Old myocardial infarction: Secondary | ICD-10-CM | POA: Diagnosis not present

## 2016-12-06 DIAGNOSIS — J438 Other emphysema: Secondary | ICD-10-CM

## 2016-12-06 DIAGNOSIS — I214 Non-ST elevation (NSTEMI) myocardial infarction: Secondary | ICD-10-CM

## 2016-12-06 DIAGNOSIS — I2510 Atherosclerotic heart disease of native coronary artery without angina pectoris: Secondary | ICD-10-CM | POA: Diagnosis not present

## 2016-12-06 DIAGNOSIS — I5023 Acute on chronic systolic (congestive) heart failure: Secondary | ICD-10-CM

## 2016-12-06 NOTE — Assessment & Plan Note (Signed)
EF 35% at time of her NSTEMI- improved to 40-45% by echo April 2018 Tolerating [name]

## 2016-12-06 NOTE — Assessment & Plan Note (Signed)
Followed by PCP. She is on inhalers, not on O2
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
## 2016-12-06 NOTE — Patient Instructions (Signed)
Medication Instructions: Your physician recommends that you continue on your current medications as directed. Please refer to the Current Medication list given to you today.  If you need a refill on your cardiac medications before your next appointment, please call your pharmacy.   Labwork: Your physician recommends that you return for lab work: CMET and a FASTING Lipid   Follow-Up: Your physician wants you to follow-up in: 6 months with Dr. Gwenlyn Found. You will receive a reminder letter in the mail two months in advance. If you don't receive a letter, please call our office at [phone] to schedule this follow-up appointment.    Thank you for choosing [facility] at [facility]!!

## 2016-12-06 NOTE — Assessment & Plan Note (Signed)
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

## 2016-12-06 NOTE — Assessment & Plan Note (Signed)
Acute CHF when she presented 02/28/16  No recurrent CHF since

## 2016-12-07 DIAGNOSIS — Z79899 Other long term (current) drug therapy: Secondary | ICD-10-CM | POA: Diagnosis not present

## 2016-12-08 LAB — LIPID PANEL
Chol/HDL Ratio: 3.5 ratio (ref 0.0–4.4)
Cholesterol, Total: 112 mg/dL (ref 100–199)
HDL: 32 mg/dL — ABNORMAL LOW (ref >39)
LDL Calculated: 59 mg/dL (ref 0–99)
Triglycerides: 107 mg/dL (ref 0–149)
VLDL Cholesterol Cal: 21 mg/dL (ref 5–40)

## 2016-12-08 LAB — COMPREHENSIVE METABOLIC PANEL
ALT: 9 IU/L (ref 0–32)
AST: 16 IU/L (ref 0–40)
Albumin/Globulin Ratio: 1.6 (ref 1.2–2.2)
Albumin: 4 g/dL (ref 3.5–4.8)
Alkaline Phosphatase: 66 IU/L (ref 39–117)
BUN/Creatinine Ratio: 21 (ref 12–28)
BUN: 21 mg/dL (ref 8–27)
Bilirubin Total: 0.3 mg/dL (ref 0.0–1.2)
CO2: 20 mmol/L (ref 20–29)
Calcium: 10.3 mg/dL (ref 8.7–10.3)
Chloride: 100 mmol/L (ref 96–106)
Creatinine, Ser: 1.01 mg/dL — ABNORMAL HIGH (ref 0.57–1.00)
GFR calc Af Amer: 63 mL/min/{1.73_m2} (ref >59)
GFR calc non Af Amer: 55 mL/min/{1.73_m2} — ABNORMAL LOW (ref >59)
Globulin, Total: 2.5 g/dL (ref 1.5–4.5)
Glucose: 105 mg/dL — ABNORMAL HIGH (ref 65–99)
Potassium: 4.6 mmol/L (ref 3.5–5.2)
Sodium: 139 mmol/L (ref 134–144)
Total Protein: 6.5 g/dL (ref 6.0–8.5)

## 2016-12-31 ENCOUNTER — Other Ambulatory Visit: Payer: Self-pay | Admitting: Cardiovascular Disease

## 2017-01-18 ENCOUNTER — Other Ambulatory Visit: Payer: Self-pay | Admitting: Cardiovascular Disease

## 2017-01-18 NOTE — Telephone Encounter (Signed)
Rx(s) sent to pharmacy electronically.  

## 2017-01-21 ENCOUNTER — Other Ambulatory Visit: Payer: Self-pay | Admitting: Cardiology

## 2017-02-01 ENCOUNTER — Other Ambulatory Visit: Payer: Self-pay | Admitting: Cardiovascular Disease

## 2017-04-12 ENCOUNTER — Telehealth: Payer: Self-pay | Admitting: Cardiovascular Disease

## 2017-04-12 NOTE — Telephone Encounter (Signed)
Spoke with the patient. Her authorization for [name] is getting ready to expire. Message routed to Dr. Kennon Holter assistant.

## 2017-04-12 NOTE — Telephone Encounter (Signed)
Please call,concerning her Entresto.

## 2017-04-12 NOTE — Telephone Encounter (Signed)
New message   Patient returning call for [facility]. Please call

## 2017-04-12 NOTE — Telephone Encounter (Signed)
Phone # on file for patient is Bartholomew Crews (friend). [email or web address]

## 2017-07-09 ENCOUNTER — Ambulatory Visit (INDEPENDENT_AMBULATORY_CARE_PROVIDER_SITE_OTHER): Payer: Medicare PPO | Admitting: Cardiovascular Disease

## 2017-07-09 ENCOUNTER — Encounter: Payer: Self-pay | Admitting: Cardiovascular Disease

## 2017-07-09 VITALS — BP 136/60 | HR 68 | Ht 70.0 in | Wt 158.8 lb

## 2017-07-09 DIAGNOSIS — E7800 Pure hypercholesterolemia, unspecified: Secondary | ICD-10-CM

## 2017-07-09 DIAGNOSIS — R0989 Other specified symptoms and signs involving the circulatory and respiratory systems: Secondary | ICD-10-CM

## 2017-07-09 DIAGNOSIS — I252 Old myocardial infarction: Secondary | ICD-10-CM

## 2017-07-09 DIAGNOSIS — E785 Hyperlipidemia, unspecified: Secondary | ICD-10-CM | POA: Insufficient documentation

## 2017-07-09 DIAGNOSIS — I255 Ischemic cardiomyopathy: Secondary | ICD-10-CM | POA: Diagnosis not present

## 2017-07-09 NOTE — Assessment & Plan Note (Signed)
History of CAD cardiac catheterization performed Kelly Benjamin 03/02/2016 after being admitted with a non-STEMI.  This showed a 90% proximal dominant RCA, high-grade apical LAD with left to right collaterals.  Her EF at that time was 35% which increased to 40 to 45% by 2D echo 06/20/2016.  She is completely asymptomatic.

## 2017-07-09 NOTE — Patient Instructions (Signed)
Medication Instructions: Your physician recommends that you continue on your current medications as directed. Please refer to the Current Medication list given to you today.   Testing/Procedures: Your physician has requested that you have a carotid duplex. This test is an ultrasound of the carotid arteries in your neck. It looks at blood flow through these arteries that supply the brain with blood. Allow one hour for this exam. There are no restrictions or special instructions.  Follow-Up: We request that you follow-up in: 6 months with Kerin Ransom, PA and in 12 months with Dr Andria Rhein will receive a reminder letter in the mail two months in advance. If you don't receive a letter, please call our office to schedule the follow-up appointment.  If you need a refill on your cardiac medications before your next appointment, please call your pharmacy.

## 2017-07-09 NOTE — Assessment & Plan Note (Signed)
EF 40 to 45% by 2D echo April 2018 on Entresto, metoprolol and Imdur .  She is asymptomatic.

## 2017-07-09 NOTE — Assessment & Plan Note (Signed)
History of hyperlipidemia on statin therapy with lipid profile performed 12/07/2016 revealing LDL 59 and HDL of 32.

## 2017-07-09 NOTE — Progress Notes (Signed)
07/09/2017 ASCENCION STEGNER   [birth year]  [id number]  Primary Physician Maurice Small, MD Primary Cardiologist: Lorretta Harp MD Lupe Carney, [place]  HPI:  Kelly Benjamin is a 77 y.o.  with no children who is a retired Radio producer in [employer]. I apparently saw her during her recent hospitalization in the end of December.  I last saw her in the office 06/29/2016.  She has a history of hypertension and hyperlipidemia as well as a long history of tobacco abuse having smoked 100 pack years and stopped 2 years ago. She was admitted on 02/28/16 with a COPD flare and elevated BNP and mild troponin leak. She had a 2-D echo which revealed an EF of 35% with anterolateral wall motion abnormality and a Myoview on 03/01/16 that showed anterior apical lateral scarring. Subsequent cardiac catheterization performed the following day by Dr. Tamala Julian on 03/02/16 showed a 90% proximal large RCA stenosis with left-to-right collaterals from a small circumflex and another 85% distal LAD stenosis. Medical therapy was recommended. Recent echo performed 06/20/16 showed improvement in LV function with an EF of 40-45%. She feels clinically improved on optimal medical therapy.  Since I saw her a year ago she is remained stable denying chest pain or shortness of breath.      Current Meds  Medication Sig  . aspirin EC 81 MG tablet Take 1 tablet (81 mg total) by mouth daily.  Marland Kitchen atorvastatin (LIPITOR) 40 MG tablet Take 40 mg by mouth daily.  . clopidogrel (PLAVIX) 75 MG tablet TAKE 1 TABLET BY MOUTH EVERY DAY  . ENTRESTO 97-103 MG TAKE 1 TABLET BY MOUTH 2 (TWO) TIMES DAILY.  Marland Kitchen ezetimibe (ZETIA) 10 MG tablet TAKE 1 TABLET EVERY DAY  . fluticasone furoate-vilanterol (BREO ELLIPTA) 200-25 MCG/INH AEPB Inhale 1 puff into the lungs daily.  . furosemide (LASIX) 40 MG tablet TAKE 1 TABLET BY MOUTH EVERY DAY  . isosorbide mononitrate (IMDUR) 30 MG 24 hr tablet TAKE 1 TABLET BY MOUTH EVERY DAY  . metoprolol  succinate (TOPROL-XL) 25 MG 24 hr tablet TAKE 1 TABLET BY MOUTH EVERY DAY  . Multiple Vitamins-Minerals (ICAPS AREDS 2) CAPS Take 1 capsule by mouth 2 (two) times daily.  Marland Kitchen SPIRIVA HANDIHALER 18 MCG inhalation capsule INHALE CONTENTS OF 1 CAPSULE EVERY DAY     Allergies  Allergen Reactions  . Sulfa Antibiotics Swelling    Social History   Socioeconomic History  . Marital status: Single    Spouse name: Not on file  . Number of children: Not on file  . Years of education: Not on file  . Highest education level: Not on file  Occupational History  . Not on file  Social Needs  . Financial resource strain: Not on file  . Food insecurity:    Worry: Not on file    Inability: Not on file  . Transportation needs:    Medical: Not on file    Non-medical: Not on file  Tobacco Use  . Smoking status: Former Research scientist (life sciences)  . Smokeless tobacco: Former Network engineer and Sexual Activity  . Alcohol use: Yes    Comment: occassionally  . Drug use: No  . Sexual activity: Not on file  Lifestyle  . Physical activity:    Days per week: Not on file    Minutes per session: Not on file  . Stress: Not on file  Relationships  . Social connections:    Talks on phone: Not on file  Gets together: Not on file    Attends religious service: Not on file    Active member of club or organization: Not on file    Attends meetings of clubs or organizations: Not on file    Relationship status: Not on file  . Intimate partner violence:    Fear of current or ex partner: Not on file    Emotionally abused: Not on file    Physically abused: Not on file    Forced sexual activity: Not on file  Other Topics Concern  . Not on file  Social History Narrative  . Not on file     Review of Systems: General: negative for chills, fever, night sweats or weight changes.  Cardiovascular: negative for chest pain, dyspnea on exertion, edema, orthopnea, palpitations, paroxysmal nocturnal dyspnea or shortness of  breath Dermatological: negative for rash Respiratory: negative for cough or wheezing Urologic: negative for hematuria Abdominal: negative for nausea, vomiting, diarrhea, bright red blood per rectum, melena, or hematemesis Neurologic: negative for visual changes, syncope, or dizziness All other systems reviewed and are otherwise negative except as noted above.    Blood pressure 136/60, pulse 68, height 5\' 10"  (1.778 m), weight 158 lb 12.8 oz (72 kg).  General appearance: alert and no distress Neck: no adenopathy, no JVD, supple, symmetrical, trachea midline, thyroid not enlarged, symmetric, no tenderness/mass/nodules and Left carotid bruit Lungs: clear to auscultation bilaterally Heart: regular rate and rhythm, S1, S2 normal, no murmur, click, rub or gallop Extremities: extremities normal, atraumatic, no cyanosis or edema Pulses: 2+ and symmetric Skin: Skin color, texture, turgor normal. No rashes or lesions Neurologic: Alert and oriented X 3, normal strength and tone. Normal symmetric reflexes. Normal coordination and gait  EKG sinus rhythm at 68 without ST or T wave changes.  I personally reviewed this EKG.  ASSESSMENT AND PLAN:   History of non-ST elevation myocardial infarction (NSTEMI) History of CAD cardiac catheterization performed Tamala Julian 03/02/2016 after being admitted with a non-STEMI.  This showed a 90% proximal dominant RCA, high-grade apical LAD with left to right collaterals.  Her EF at that time was 35% which increased to 40 to 45% by 2D echo 06/20/2016.  She is completely asymptomatic.  Ischemic cardiomyopathy EF 40 to 45% by 2D echo April 2018 on Entresto, metoprolol and Imdur .  She is asymptomatic.  Hyperlipidemia History of hyperlipidemia on statin therapy with lipid profile performed 12/07/2016 revealing LDL 59 and HDL of 32.      Lorretta Harp MD [facility], [facility] 07/09/2017 3:19 PM

## 2017-07-15 DIAGNOSIS — D485 Neoplasm of uncertain behavior of skin: Secondary | ICD-10-CM | POA: Diagnosis not present

## 2017-07-15 DIAGNOSIS — C44521 Squamous cell carcinoma of skin of breast: Secondary | ICD-10-CM | POA: Diagnosis not present

## 2017-07-15 DIAGNOSIS — L821 Other seborrheic keratosis: Secondary | ICD-10-CM | POA: Diagnosis not present

## 2017-07-16 ENCOUNTER — Other Ambulatory Visit: Payer: Self-pay | Admitting: Cardiovascular Disease

## 2017-07-16 ENCOUNTER — Ambulatory Visit (HOSPITAL_COMMUNITY)
Admission: RE | Admit: 2017-07-16 | Discharge: 2017-07-16 | Disposition: A | Discharge status: Home / Self Care | Payer: Medicare PPO | Source: Ambulatory Visit | Attending: Cardiovascular Disease | Admitting: Cardiovascular Disease

## 2017-07-16 DIAGNOSIS — I6523 Occlusion and stenosis of bilateral carotid arteries: Secondary | ICD-10-CM | POA: Diagnosis not present

## 2017-07-16 DIAGNOSIS — R0989 Other specified symptoms and signs involving the circulatory and respiratory systems: Secondary | ICD-10-CM

## 2017-07-16 DIAGNOSIS — I2510 Atherosclerotic heart disease of native coronary artery without angina pectoris: Secondary | ICD-10-CM | POA: Diagnosis not present

## 2017-07-16 DIAGNOSIS — E785 Hyperlipidemia, unspecified: Secondary | ICD-10-CM | POA: Diagnosis not present

## 2017-07-16 DIAGNOSIS — Z87891 Personal history of nicotine dependence: Secondary | ICD-10-CM | POA: Insufficient documentation

## 2017-07-17 ENCOUNTER — Telehealth: Payer: Self-pay | Admitting: *Deleted

## 2017-07-17 NOTE — Telephone Encounter (Signed)
PA for entresto started in cover my meds

## 2017-07-18 NOTE — Telephone Encounter (Signed)
eliquis has been approved.

## 2017-09-02 DIAGNOSIS — C50919 Malignant neoplasm of unspecified site of unspecified female breast: Secondary | ICD-10-CM

## 2017-09-02 HISTORY — DX: Malignant neoplasm of unspecified site of unspecified female breast: C50.919

## 2017-09-02 HISTORY — PX: BREAST LUMPECTOMY: SHX2

## 2017-09-03 DIAGNOSIS — C44521 Squamous cell carcinoma of skin of breast: Secondary | ICD-10-CM | POA: Diagnosis not present

## 2017-09-17 ENCOUNTER — Other Ambulatory Visit: Payer: Self-pay | Admitting: Cardiovascular Disease

## 2017-09-17 DIAGNOSIS — Z1231 Encounter for screening mammogram for malignant neoplasm of breast: Secondary | ICD-10-CM

## 2017-10-11 ENCOUNTER — Other Ambulatory Visit: Payer: Self-pay | Admitting: Cardiovascular Disease

## 2017-10-11 NOTE — Telephone Encounter (Signed)
Rx(s) sent to pharmacy electronically.  

## 2017-10-21 DIAGNOSIS — I10 Essential (primary) hypertension: Secondary | ICD-10-CM | POA: Diagnosis not present

## 2017-10-21 DIAGNOSIS — Z0000 Encounter for general adult medical examination without abnormal findings: Secondary | ICD-10-CM | POA: Diagnosis not present

## 2017-10-21 DIAGNOSIS — E782 Mixed hyperlipidemia: Secondary | ICD-10-CM | POA: Diagnosis not present

## 2017-10-21 DIAGNOSIS — J449 Chronic obstructive pulmonary disease, unspecified: Secondary | ICD-10-CM | POA: Diagnosis not present

## 2017-10-21 DIAGNOSIS — I5022 Chronic systolic (congestive) heart failure: Secondary | ICD-10-CM | POA: Diagnosis not present

## 2017-10-21 DIAGNOSIS — I2510 Atherosclerotic heart disease of native coronary artery without angina pectoris: Secondary | ICD-10-CM | POA: Diagnosis not present

## 2017-11-06 DIAGNOSIS — I10 Essential (primary) hypertension: Secondary | ICD-10-CM | POA: Diagnosis not present

## 2017-12-09 ENCOUNTER — Ambulatory Visit
Admission: RE | Admit: 2017-12-09 | Discharge: 2017-12-09 | Disposition: A | Discharge status: Home / Self Care | Payer: Medicare PPO | Source: Ambulatory Visit | Attending: Cardiovascular Disease | Admitting: Cardiovascular Disease

## 2017-12-09 DIAGNOSIS — Z1231 Encounter for screening mammogram for malignant neoplasm of breast: Secondary | ICD-10-CM | POA: Diagnosis not present

## 2017-12-18 ENCOUNTER — Other Ambulatory Visit: Payer: Self-pay | Admitting: Cardiovascular Disease

## 2017-12-18 NOTE — Telephone Encounter (Signed)
Rx request sent to pharmacy.  

## 2018-01-06 ENCOUNTER — Ambulatory Visit (INDEPENDENT_AMBULATORY_CARE_PROVIDER_SITE_OTHER): Payer: Medicare PPO | Admitting: Cardiology

## 2018-01-06 ENCOUNTER — Encounter: Payer: Self-pay | Admitting: Cardiology

## 2018-01-06 DIAGNOSIS — E785 Hyperlipidemia, unspecified: Secondary | ICD-10-CM | POA: Diagnosis not present
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
## 2018-01-06 NOTE — Patient Instructions (Signed)
Medication Instructions:  Continue current medications If you need a refill on your cardiac medications before your next appointment, please call your pharmacy.   Follow-Up: At [facility], you and your health needs are our priority.  As part of our continuing mission to provide you with exceptional heart care, we have created designated Provider Care Teams.  These Care Teams include your primary Cardiologist (physician) and Advanced Practice Providers (APPs -  Physician Assistants and Nurse Practitioners) who all work together to provide you with the care you need, when you need it. You will need a follow up appointment in 6 months.  Please call our office 2 months in advance to schedule this appointment.  You may see Dr. Gwenlyn Found or one of the following Advanced Practice Providers on your designated Care Team:   Kerin Ransom, PA-C Roby Lofts, [place] . Sande Rives, PA-C  Any Other Special Instructions Will Be Listed Below (If Applicable).

## 2018-01-11 ENCOUNTER — Other Ambulatory Visit: Payer: Self-pay | Admitting: Cardiovascular Disease

## 2018-01-13 NOTE — Telephone Encounter (Signed)
Rx request sent to pharmacy.  

## 2018-03-08 ENCOUNTER — Other Ambulatory Visit: Payer: Self-pay

## 2018-03-08 ENCOUNTER — Encounter (HOSPITAL_COMMUNITY): Payer: Self-pay | Admitting: Emergency Medicine

## 2018-03-08 ENCOUNTER — Inpatient Hospital Stay (HOSPITAL_COMMUNITY)
Admission: EM | Admit: 2018-03-08 | Discharge: 2018-04-05 | DRG: 853 | Disposition: E | Payer: Medicare PPO | Attending: Internal Medicine | Admitting: Internal Medicine

## 2018-03-08 ENCOUNTER — Emergency Department (HOSPITAL_COMMUNITY): Payer: Medicare PPO

## 2018-03-08 ENCOUNTER — Inpatient Hospital Stay (HOSPITAL_COMMUNITY): Payer: Medicare PPO

## 2018-03-08 DIAGNOSIS — R1312 Dysphagia, oropharyngeal phase: Secondary | ICD-10-CM | POA: Diagnosis not present

## 2018-03-08 DIAGNOSIS — I4891 Unspecified atrial fibrillation: Secondary | ICD-10-CM | POA: Diagnosis not present

## 2018-03-08 DIAGNOSIS — Z515 Encounter for palliative care: Secondary | ICD-10-CM | POA: Diagnosis not present

## 2018-03-08 DIAGNOSIS — G8194 Hemiplegia, unspecified affecting left nondominant side: Secondary | ICD-10-CM | POA: Diagnosis not present

## 2018-03-08 DIAGNOSIS — D500 Iron deficiency anemia secondary to blood loss (chronic): Secondary | ICD-10-CM | POA: Diagnosis not present

## 2018-03-08 DIAGNOSIS — I2510 Atherosclerotic heart disease of native coronary artery without angina pectoris: Secondary | ICD-10-CM | POA: Diagnosis present

## 2018-03-08 DIAGNOSIS — I5043 Acute on chronic combined systolic (congestive) and diastolic (congestive) heart failure: Secondary | ICD-10-CM | POA: Diagnosis present

## 2018-03-08 DIAGNOSIS — E870 Hyperosmolality and hypernatremia: Secondary | ICD-10-CM | POA: Diagnosis not present

## 2018-03-08 DIAGNOSIS — D649 Anemia, unspecified: Secondary | ICD-10-CM | POA: Diagnosis not present

## 2018-03-08 DIAGNOSIS — D72829 Elevated white blood cell count, unspecified: Secondary | ICD-10-CM

## 2018-03-08 DIAGNOSIS — J9690 Respiratory failure, unspecified, unspecified whether with hypoxia or hypercapnia: Secondary | ICD-10-CM

## 2018-03-08 DIAGNOSIS — J439 Emphysema, unspecified: Secondary | ICD-10-CM | POA: Diagnosis present

## 2018-03-08 DIAGNOSIS — J690 Pneumonitis due to inhalation of food and vomit: Secondary | ICD-10-CM | POA: Diagnosis not present

## 2018-03-08 DIAGNOSIS — A419 Sepsis, unspecified organism: Secondary | ICD-10-CM | POA: Diagnosis not present

## 2018-03-08 DIAGNOSIS — I509 Heart failure, unspecified: Secondary | ICD-10-CM | POA: Diagnosis not present

## 2018-03-08 DIAGNOSIS — R609 Edema, unspecified: Secondary | ICD-10-CM | POA: Diagnosis not present

## 2018-03-08 DIAGNOSIS — D638 Anemia in other chronic diseases classified elsewhere: Secondary | ICD-10-CM | POA: Diagnosis present

## 2018-03-08 DIAGNOSIS — D509 Iron deficiency anemia, unspecified: Secondary | ICD-10-CM | POA: Diagnosis not present

## 2018-03-08 DIAGNOSIS — I214 Non-ST elevation (NSTEMI) myocardial infarction: Secondary | ICD-10-CM | POA: Diagnosis present

## 2018-03-08 DIAGNOSIS — J181 Lobar pneumonia, unspecified organism: Secondary | ICD-10-CM | POA: Diagnosis not present

## 2018-03-08 DIAGNOSIS — I952 Hypotension due to drugs: Secondary | ICD-10-CM | POA: Diagnosis not present

## 2018-03-08 DIAGNOSIS — I6501 Occlusion and stenosis of right vertebral artery: Secondary | ICD-10-CM | POA: Diagnosis not present

## 2018-03-08 DIAGNOSIS — R0602 Shortness of breath: Secondary | ICD-10-CM | POA: Diagnosis not present

## 2018-03-08 DIAGNOSIS — I480 Paroxysmal atrial fibrillation: Secondary | ICD-10-CM | POA: Diagnosis not present

## 2018-03-08 DIAGNOSIS — I482 Chronic atrial fibrillation, unspecified: Secondary | ICD-10-CM | POA: Diagnosis present

## 2018-03-08 DIAGNOSIS — I5041 Acute combined systolic (congestive) and diastolic (congestive) heart failure: Secondary | ICD-10-CM | POA: Diagnosis not present

## 2018-03-08 DIAGNOSIS — I6521 Occlusion and stenosis of right carotid artery: Secondary | ICD-10-CM | POA: Diagnosis not present

## 2018-03-08 DIAGNOSIS — R1310 Dysphagia, unspecified: Secondary | ICD-10-CM | POA: Diagnosis not present

## 2018-03-08 DIAGNOSIS — E46 Unspecified protein-calorie malnutrition: Secondary | ICD-10-CM | POA: Diagnosis not present

## 2018-03-08 DIAGNOSIS — J449 Chronic obstructive pulmonary disease, unspecified: Secondary | ICD-10-CM | POA: Diagnosis not present

## 2018-03-08 DIAGNOSIS — J189 Pneumonia, unspecified organism: Secondary | ICD-10-CM | POA: Diagnosis present

## 2018-03-08 DIAGNOSIS — Z6825 Body mass index (BMI) 25.0-25.9, adult: Secondary | ICD-10-CM

## 2018-03-08 DIAGNOSIS — Z7902 Long term (current) use of antithrombotics/antiplatelets: Secondary | ICD-10-CM

## 2018-03-08 DIAGNOSIS — T4275XA Adverse effect of unspecified antiepileptic and sedative-hypnotic drugs, initial encounter: Secondary | ICD-10-CM | POA: Diagnosis not present

## 2018-03-08 DIAGNOSIS — G936 Cerebral edema: Secondary | ICD-10-CM | POA: Diagnosis not present

## 2018-03-08 DIAGNOSIS — J9811 Atelectasis: Secondary | ICD-10-CM | POA: Diagnosis not present

## 2018-03-08 DIAGNOSIS — Z853 Personal history of malignant neoplasm of breast: Secondary | ICD-10-CM

## 2018-03-08 DIAGNOSIS — Z09 Encounter for follow-up examination after completed treatment for conditions other than malignant neoplasm: Secondary | ICD-10-CM

## 2018-03-08 DIAGNOSIS — J9601 Acute respiratory failure with hypoxia: Secondary | ICD-10-CM | POA: Diagnosis present

## 2018-03-08 DIAGNOSIS — Z66 Do not resuscitate: Secondary | ICD-10-CM | POA: Diagnosis not present

## 2018-03-08 DIAGNOSIS — Z7189 Other specified counseling: Secondary | ICD-10-CM | POA: Diagnosis not present

## 2018-03-08 DIAGNOSIS — I10 Essential (primary) hypertension: Secondary | ICD-10-CM | POA: Diagnosis not present

## 2018-03-08 DIAGNOSIS — I700 Atherosclerosis of aorta: Secondary | ICD-10-CM | POA: Diagnosis present

## 2018-03-08 DIAGNOSIS — R740 Nonspecific elevation of levels of transaminase and lactic acid dehydrogenase [LDH]: Secondary | ICD-10-CM | POA: Diagnosis not present

## 2018-03-08 DIAGNOSIS — I252 Old myocardial infarction: Secondary | ICD-10-CM

## 2018-03-08 DIAGNOSIS — T17990A Other foreign object in respiratory tract, part unspecified in causing asphyxiation, initial encounter: Secondary | ICD-10-CM | POA: Diagnosis not present

## 2018-03-08 DIAGNOSIS — I639 Cerebral infarction, unspecified: Secondary | ICD-10-CM | POA: Diagnosis not present

## 2018-03-08 DIAGNOSIS — J90 Pleural effusion, not elsewhere classified: Secondary | ICD-10-CM | POA: Diagnosis not present

## 2018-03-08 DIAGNOSIS — I255 Ischemic cardiomyopathy: Secondary | ICD-10-CM | POA: Diagnosis present

## 2018-03-08 DIAGNOSIS — E871 Hypo-osmolality and hyponatremia: Secondary | ICD-10-CM | POA: Diagnosis not present

## 2018-03-08 DIAGNOSIS — R29810 Facial weakness: Secondary | ICD-10-CM | POA: Diagnosis not present

## 2018-03-08 DIAGNOSIS — Y92239 Unspecified place in hospital as the place of occurrence of the external cause: Secondary | ICD-10-CM | POA: Diagnosis not present

## 2018-03-08 DIAGNOSIS — I6601 Occlusion and stenosis of right middle cerebral artery: Secondary | ICD-10-CM | POA: Diagnosis present

## 2018-03-08 DIAGNOSIS — R9389 Abnormal findings on diagnostic imaging of other specified body structures: Secondary | ICD-10-CM | POA: Diagnosis not present

## 2018-03-08 DIAGNOSIS — I081 Rheumatic disorders of both mitral and tricuspid valves: Secondary | ICD-10-CM | POA: Diagnosis present

## 2018-03-08 DIAGNOSIS — R778 Other specified abnormalities of plasma proteins: Secondary | ICD-10-CM

## 2018-03-08 DIAGNOSIS — R7989 Other specified abnormal findings of blood chemistry: Secondary | ICD-10-CM | POA: Diagnosis not present

## 2018-03-08 DIAGNOSIS — Z923 Personal history of irradiation: Secondary | ICD-10-CM

## 2018-03-08 DIAGNOSIS — I340 Nonrheumatic mitral (valve) insufficiency: Secondary | ICD-10-CM | POA: Diagnosis not present

## 2018-03-08 DIAGNOSIS — I5023 Acute on chronic systolic (congestive) heart failure: Secondary | ICD-10-CM

## 2018-03-08 DIAGNOSIS — Z8249 Family history of ischemic heart disease and other diseases of the circulatory system: Secondary | ICD-10-CM

## 2018-03-08 DIAGNOSIS — N179 Acute kidney failure, unspecified: Secondary | ICD-10-CM | POA: Diagnosis not present

## 2018-03-08 DIAGNOSIS — I63411 Cerebral infarction due to embolism of right middle cerebral artery: Secondary | ICD-10-CM | POA: Diagnosis not present

## 2018-03-08 DIAGNOSIS — F419 Anxiety disorder, unspecified: Secondary | ICD-10-CM | POA: Diagnosis present

## 2018-03-08 DIAGNOSIS — I248 Other forms of acute ischemic heart disease: Secondary | ICD-10-CM | POA: Diagnosis not present

## 2018-03-08 DIAGNOSIS — R0600 Dyspnea, unspecified: Secondary | ICD-10-CM | POA: Diagnosis not present

## 2018-03-08 DIAGNOSIS — Z4682 Encounter for fitting and adjustment of non-vascular catheter: Secondary | ICD-10-CM | POA: Diagnosis not present

## 2018-03-08 DIAGNOSIS — I63511 Cerebral infarction due to unspecified occlusion or stenosis of right middle cerebral artery: Secondary | ICD-10-CM

## 2018-03-08 DIAGNOSIS — R0603 Acute respiratory distress: Secondary | ICD-10-CM | POA: Diagnosis not present

## 2018-03-08 DIAGNOSIS — H518 Other specified disorders of binocular movement: Secondary | ICD-10-CM | POA: Diagnosis not present

## 2018-03-08 DIAGNOSIS — I110 Hypertensive heart disease with heart failure: Secondary | ICD-10-CM | POA: Diagnosis present

## 2018-03-08 DIAGNOSIS — E872 Acidosis: Secondary | ICD-10-CM | POA: Diagnosis not present

## 2018-03-08 DIAGNOSIS — R600 Localized edema: Secondary | ICD-10-CM | POA: Diagnosis not present

## 2018-03-08 DIAGNOSIS — E785 Hyperlipidemia, unspecified: Secondary | ICD-10-CM | POA: Diagnosis present

## 2018-03-08 DIAGNOSIS — J438 Other emphysema: Secondary | ICD-10-CM | POA: Diagnosis not present

## 2018-03-08 DIAGNOSIS — J984 Other disorders of lung: Secondary | ICD-10-CM | POA: Diagnosis not present

## 2018-03-08 DIAGNOSIS — Z7982 Long term (current) use of aspirin: Secondary | ICD-10-CM

## 2018-03-08 DIAGNOSIS — I472 Ventricular tachycardia: Secondary | ICD-10-CM | POA: Diagnosis not present

## 2018-03-08 DIAGNOSIS — Z79899 Other long term (current) drug therapy: Secondary | ICD-10-CM

## 2018-03-08 DIAGNOSIS — I493 Ventricular premature depolarization: Secondary | ICD-10-CM | POA: Diagnosis not present

## 2018-03-08 LAB — I-STAT TROPONIN, ED: TROPONIN I, POC: 0.36 ng/mL — AB (ref 0.00–0.08)

## 2018-03-08 LAB — BASIC METABOLIC PANEL
Anion gap: 10 (ref 5–15)
BUN: 17 mg/dL (ref 8–23)
CO2: 22 mmol/L (ref 22–32)
CREATININE: 0.95 mg/dL (ref 0.44–1.00)
Calcium: 9.2 mg/dL (ref 8.9–10.3)
Chloride: 105 mmol/L (ref 98–111)
GFR calc Af Amer: >60 mL/min (ref >60)
GFR calc non Af Amer: 58 mL/min — ABNORMAL LOW (ref >60)
Glucose, Bld: 127 mg/dL — ABNORMAL HIGH (ref 70–99)
POTASSIUM: 3.6 mmol/L (ref 3.5–5.1)
SODIUM: 137 mmol/L (ref 135–145)

## 2018-03-08 LAB — CBC
HEMATOCRIT: 20 % — AB (ref 36.0–46.0)
Hemoglobin: 5.6 g/dL — CL (ref 12.0–15.0)
MCH: 22.9 pg — ABNORMAL LOW (ref 26.0–34.0)
MCHC: 28 g/dL — ABNORMAL LOW (ref 30.0–36.0)
MCV: 81.6 fL (ref 80.0–100.0)
NRBC: 0 % (ref 0.0–0.2)
Platelets: 335 10*3/uL (ref 150–400)
RBC: 2.45 MIL/uL — AB (ref 3.87–5.11)
RDW: 18.2 % — AB (ref 11.5–15.5)
WBC: 10.1 10*3/uL (ref 4.0–10.5)

## 2018-03-08 LAB — INFLUENZA PANEL BY PCR (TYPE A & B)
Influenza A By PCR: NEGATIVE
Influenza B By PCR: NEGATIVE

## 2018-03-08 LAB — BRAIN NATRIURETIC PEPTIDE: B Natriuretic Peptide: 3356.1 pg/mL — ABNORMAL HIGH (ref 0.0–100.0)

## 2018-03-08 LAB — I-STAT CG4 LACTIC ACID, ED
Lactic Acid, Venous: 1.46 mmol/L (ref 0.5–1.9)
Lactic Acid, Venous: 1.11 mmol/L (ref 0.5–1.9)

## 2018-03-08 LAB — TROPONIN I: Troponin I: 0.73 ng/mL (ref <0.03)

## 2018-03-08 LAB — PREPARE RBC (CROSSMATCH)

## 2018-03-08 LAB — ABO/RH: ABO/RH(D): A POS

## 2018-03-08 MED ORDER — ASPIRIN 81 MG PO CHEW
243.0000 mg | CHEWABLE_TABLET | Freq: Once | ORAL | Status: AC
  Administered 2018-03-08: 243 mg via ORAL
  Filled 2018-03-08: qty 3

## 2018-03-08 MED ORDER — FLUTICASONE FUROATE-VILANTEROL 200-25 MCG/INH IN AEPB
1.0000 | INHALATION_SPRAY | Freq: Every day | RESPIRATORY_TRACT | Status: DC
  Administered 2018-03-09 – 2018-03-14 (×6): 1 via RESPIRATORY_TRACT
  Filled 2018-03-08: qty 28

## 2018-03-08 MED ORDER — ASPIRIN EC 81 MG PO TBEC
81.0000 mg | DELAYED_RELEASE_TABLET | Freq: Every day | ORAL | Status: DC
  Administered 2018-03-09 – 2018-03-15 (×7): 81 mg via ORAL
  Filled 2018-03-08 (×7): qty 1

## 2018-03-08 MED ORDER — SODIUM CHLORIDE 0.9% FLUSH: 3.0000 mL | INTRAVENOUS | Status: DC | PRN

## 2018-03-08 MED ORDER — SODIUM CHLORIDE 0.9 % IV SOLN
1.0000 g | INTRAVENOUS | Status: DC
  Administered 2018-03-08 – 2018-03-10 (×3): 1 g via INTRAVENOUS
  Filled 2018-03-08 (×3): qty 10

## 2018-03-08 MED ORDER — SODIUM CHLORIDE 0.9 % IV SOLN
500.0000 mg | INTRAVENOUS | Status: DC
  Administered 2018-03-08 – 2018-03-10 (×3): 500 mg via INTRAVENOUS
  Filled 2018-03-08 (×3): qty 500

## 2018-03-08 MED ORDER — PROSIGHT PO TABS
1.0000 | ORAL_TABLET | Freq: Two times a day (BID) | ORAL | Status: DC
  Administered 2018-03-08 – 2018-03-19 (×19): 1 via ORAL
  Filled 2018-03-08 (×20): qty 1

## 2018-03-08 MED ORDER — FUROSEMIDE 10 MG/ML IJ SOLN
40.0000 mg | Freq: Two times a day (BID) | INTRAMUSCULAR | Status: DC
  Administered 2018-03-08 – 2018-03-10 (×3): 40 mg via INTRAVENOUS
  Filled 2018-03-08 (×5): qty 4

## 2018-03-08 MED ORDER — SODIUM CHLORIDE 0.9% FLUSH
3.0000 mL | Freq: Two times a day (BID) | INTRAVENOUS | Status: DC
  Administered 2018-03-08 – 2018-03-15 (×12): 3 mL via INTRAVENOUS

## 2018-03-08 MED ORDER — SODIUM CHLORIDE 0.9% IV SOLUTION: Freq: Once | INTRAVENOUS | Status: DC

## 2018-03-08 MED ORDER — ICAPS AREDS 2 PO CAPS: 1.0000 | ORAL_CAPSULE | Freq: Two times a day (BID) | ORAL | Status: DC

## 2018-03-08 MED ORDER — SODIUM CHLORIDE 0.9 % IV SOLN
250.0000 mL | INTRAVENOUS | Status: DC | PRN
  Administered 2018-03-10: 250 mL via INTRAVENOUS

## 2018-03-08 MED ORDER — ACETAMINOPHEN 325 MG PO TABS
650.0000 mg | ORAL_TABLET | ORAL | Status: DC | PRN
  Administered 2018-03-08: 650 mg via ORAL
  Filled 2018-03-08 (×2): qty 2

## 2018-03-08 NOTE — H&P (Signed)
History and Physical    Kelly Benjamin WUJ:[id number] DOB: [birth year] DOA: 03/20/2018  PCP: Maurice Small, MD  Patient coming from: Home  Chief Complaint: Shortness of breath  HPI: Kelly Benjamin is a 78 y.o. female with medical history significant of coronary artery disease, ischemic cardiomyopathy with EF in the past to 40%, iron deficiency anemia as a child comes in with several days of progressive worsening shortness of breath especially with exertion without any chest pain.  No nausea vomiting or diarrhea.  She is been having generalized swelling in her legs for over 2 weeks.  She denies any melanotic stools or any bright red blood per rectum.  She denies any abdominal pain or any chronic nausea or vomiting.  Patient reports no previous knowledge of anemia in the past except during childhood and again in her 57s.  She denies any hemoptysis.  She is not a current smoker.  Patient found to have a hemoglobin of 5.6 and a positive troponin with a questionable mass on her chest x-ray.  Patient is being referred for admission for symptomatic anemia.  She has been coughing but is been dry nonproductive again with no blood.  She denies fevers and chills at home.  Review of Systems: As per HPI otherwise 10 point review of systems negative.   Past Medical History:  Diagnosis Date  . Breast cancer ([place]) 09/2017   left  . CAD in native artery 02/28/2016   2V CAD -medical rx  . COPD (chronic obstructive pulmonary disease) ([place])   . Dyslipidemia, goal LDL below 70   . Hypertension   . Ischemic cardiomyopathy    EF improved to 40-45% on Entresto  . Personal history of radiation therapy     Past Surgical History:  Procedure Laterality Date  . APPENDECTOMY    . BREAST LUMPECTOMY  09/2017  . BREAST SURGERY  2000  . CARDIAC CATHETERIZATION N/A 03/02/2016   Procedure: Left Heart Cath and Coronary Angiography;  Surgeon: Belva Crome, MD;  Location: [facility];  Service: Cardiovascular;   Laterality: N/A;  . CATARACT EXTRACTION    . KNEE SURGERY       reports that she has quit smoking. She has quit using smokeless tobacco. She reports current alcohol use. She reports that she does not use drugs.  Allergies  Allergen Reactions  . Sulfa Antibiotics Swelling    Family History  Problem Relation Age of Onset  . Heart attack Father     Prior to Admission medications   Medication Sig Start Date End Date Taking? Authorizing Provider  aspirin EC 81 MG tablet Take 1 tablet (81 mg total) by mouth daily. 03/05/16  Yes Bonnielee Haff, MD  atorvastatin (LIPITOR) 40 MG tablet TAKE 1 TABLET BY MOUTH EVERY DAY Patient taking differently: Take 40 mg by mouth at bedtime.  10/11/17  Yes Lorretta Harp, MD  clopidogrel (PLAVIX) 75 MG tablet TAKE 1 TABLET BY MOUTH EVERY DAY Patient taking differently: Take 75 mg by mouth daily.  01/21/17  Yes Lorretta Harp, MD  ENTRESTO 97-103 MG TAKE 1 TABLET BY MOUTH 2 (TWO) TIMES DAILY. Patient taking differently: Take 1 tablet by mouth 2 (two) times daily.  01/13/18  Yes Lorretta Harp, MD  ezetimibe (ZETIA) 10 MG tablet TAKE 1 TABLET EVERY DAY Patient taking differently: Take 10 mg by mouth daily.  12/18/17  Yes Lorretta Harp, MD  fluticasone furoate-vilanterol (BREO ELLIPTA) 200-25 MCG/INH AEPB Inhale 1 puff into the lungs  daily. 03/06/16  Yes Bonnielee Haff, MD  furosemide (LASIX) 40 MG tablet TAKE 1 TABLET BY MOUTH EVERY DAY Patient taking differently: Take 40 mg by mouth daily.  01/21/17  Yes Lorretta Harp, MD  isosorbide mononitrate (IMDUR) 30 MG 24 hr tablet TAKE 1 TABLET BY MOUTH EVERY DAY Patient taking differently: Take 30 mg by mouth daily.  01/21/17  Yes Lorretta Harp, MD  metoprolol succinate (TOPROL-XL) 25 MG 24 hr tablet TAKE 1 TABLET BY MOUTH EVERY DAY Patient taking differently: Take 25 mg by mouth daily.  01/21/17  Yes Lorretta Harp, MD  Multiple Vitamins-Minerals (ICAPS AREDS 2) CAPS Take 1 capsule by mouth 2  (two) times daily.   Yes [provider]  SPIRIVA HANDIHALER 18 MCG inhalation capsule INHALE CONTENTS OF 1 CAPSULE EVERY DAY Patient taking differently: Place 18 mcg into inhaler and inhale.  02/01/17  Yes Lorretta Harp, MD    Physical Exam: Vitals:   03/22/2018 1230 03/21/2018 1300 04/04/2018 1330 03/07/2018 1400  BP: (!) 97/49 (!) 109/57 114/64 (!) 106/54  Pulse: 73 74 79 76  Resp: 19 14 19  (!) 23  Temp:      TempSrc:      SpO2: 94% 94% 100% 90%  Weight:      Height:          Constitutional: NAD, calm, comfortable pale Vitals:   04/01/2018 1230 03/07/2018 1300 03/28/2018 1330 03/18/2018 1400  BP: (!) 97/49 (!) 109/57 114/64 (!) 106/54  Pulse: 73 74 79 76  Resp: 19 14 19  (!) 23  Temp:      TempSrc:      SpO2: 94% 94% 100% 90%  Weight:      Height:       Eyes: PERRL, lids and conjunctivae normal ENMT: Mucous membranes are moist. Posterior pharynx clear of any exudate or lesions.Normal dentition.  Neck: normal, supple, no masses, no thyromegaly Respiratory: clear to auscultation bilaterally, no wheezing, no crackles. Normal respiratory effort. No accessory muscle use.  Cardiovascular: Regular rate and rhythm, no murmurs / rubs / gallops.  1+ extremity edema. 2+ pedal pulses. No carotid bruits.  Abdomen: no tenderness, no masses palpated. No hepatosplenomegaly. Bowel sounds positive.  Musculoskeletal: no clubbing / cyanosis. No joint deformity upper and lower extremities. Good ROM, no contractures. Normal muscle tone.  Skin: no rashes, lesions, ulcers. No induration Neurologic: CN 2-12 grossly intact. Sensation intact, DTR normal. Strength 5/5 in all 4.  Psychiatric: Normal judgment and insight. Alert and oriented x 3. Normal mood.    Labs on Admission: I have personally reviewed following labs and imaging studies  CBC: Recent Labs  Lab 03/28/2018 1243  WBC 10.1  HGB 5.6*  HCT 20.0*  MCV 81.6  PLT 481   Basic Metabolic Panel: Recent Labs  Lab 03/25/2018 0957  NA  137  K 3.6  CL 105  CO2 22  GLUCOSE 127*  BUN 17  CREATININE 0.95  CALCIUM 9.2   GFR: Estimated Creatinine Clearance: 50.5 mL/min (by C-G formula based on SCr of 0.95 mg/dL). Liver Function Tests: No results for input(s): AST, ALT, ALKPHOS, BILITOT, PROT, ALBUMIN in the last 168 hours. No results for input(s): LIPASE, AMYLASE in the last 168 hours. No results for input(s): AMMONIA in the last 168 hours. Coagulation Profile: No results for input(s): INR, PROTIME in the last 168 hours. Cardiac Enzymes: No results for input(s): CKTOTAL, CKMB, CKMBINDEX, TROPONINI in the last 168 hours. BNP (last 3 results) No results for input(s):  PROBNP in the last 8760 hours. HbA1C: No results for input(s): HGBA1C in the last 72 hours. CBG: No results for input(s): GLUCAP in the last 168 hours. Lipid Profile: No results for input(s): CHOL, HDL, LDLCALC, TRIG, CHOLHDL, LDLDIRECT in the last 72 hours. Thyroid Function Tests: No results for input(s): TSH, T4TOTAL, FREET4, T3FREE, THYROIDAB in the last 72 hours. Anemia Panel: No results for input(s): VITAMINB12, FOLATE, FERRITIN, TIBC, IRON, RETICCTPCT in the last 72 hours. Urine analysis: No results found for: COLORURINE, APPEARANCEUR, LABSPEC, PHURINE, GLUCOSEU, HGBUR, BILIRUBINUR, KETONESUR, PROTEINUR, UROBILINOGEN, NITRITE, LEUKOCYTESUR Sepsis Labs: !!!!!!!!!!!!!!!!!!!!!!!!!!!!!!!!!!!!!!!!!!!! @LABRCNTIP (procalcitonin:4,lacticidven:4) )No results found for this or any previous visit (from the past 240 hour(s)).   Radiological Exams on Admission: Dg Chest 2 View  Result Date: 03/20/2018 CLINICAL DATA:  Chest pressure and shortness of breath on exertion. EXAM: CHEST - 2 VIEW COMPARISON:  03/03/2016 FINDINGS: The heart size appears within normal limits. Aortic atherosclerosis. Masslike consolidation within the left upper lobe is identified. This is new when compared with the previous exam. Mild diffuse edema and small left pleural effusion.  IMPRESSION: 1. Masslike consolidation within the left upper lobe is identified. In the acute setting is favored to represent pneumonia. In the absence of signs or symptoms of infection consider CT of the chest with contrast material to assess for underlying malignancy in this patient who has a history of smoking and may be at increased risk for lung cancer. 2. Small left pleural effusion and edema. Electronically Signed   By: Kerby Moors M.D.   On: 03/26/2018 10:52    EKG: Independently reviewed.  Normal sinus rhythm no acute changes Old chart reviewed Case discussed with Dr. Sherry Ruffing in the ED Chest x-ray reviewed consolidation the left upper lobe  Assessment/Plan 78 year old female with shortness of breath likely multifactorial secondary to pneumonia and anemia Principal Problem:   PNA (pneumonia)-patient does have a remote history of smoking in the past.  We will proceed with CT of her chest to make sure there is not an underlying mass.  Placed on IV Rocephin and azithromycin and obtain blood and sputum cultures.  Active Problems:   Normocytic anemia-check anemia panel.  No overt GI bleeding.  Transfuse 2 units of blood since hemoglobin is less than 6.    NSTEMI (non-ST elevated myocardial infarction) (HCC)-troponin 0 0.36.  Continue to serial and trend.  Obtain cardiac echo in the morning.  Also obtain cardiology consultation for further evaluation and work-up.    COPD (chronic obstructive pulmonary disease) (HCC)-stable at this time continue nebs    SOB (shortness of breath)-multifactorial as above    Ischemic cardiomyopathy-noted will repeat echo in the morning.    DVT prophylaxis: SCDs Code Status: Full Family Communication: Friend Disposition Plan: Days Consults called: Cardiology Admission status: Admission   DAVID,RACHAL A MD [facility]  If 7PM-7AM, please contact night-coverage [email or web address] Password [facility]  03/10/2018, 2:31 PM

## 2018-03-08 NOTE — ED Notes (Signed)
Lab reports adding BNP to previous blood draw.

## 2018-03-08 NOTE — ED Notes (Signed)
Patient transported to X-ray 

## 2018-03-08 NOTE — ED Notes (Signed)
EDP at the bedside.  ?

## 2018-03-08 NOTE — ED Triage Notes (Signed)
Pt presents via [facility] for evaluation of shortness of breath starting x 2 days. Worse with exertion. Hx of COPD and CHF. 98-100% on RA. Some lower extremity swelling over past 2 weeks.

## 2018-03-08 NOTE — ED Provider Notes (Signed)
[facility] EMERGENCY DEPARTMENT Provider Note   CSN: [id number] Arrival date & time: 03/16/2018  5462     History   Chief Complaint Chief Complaint  Patient presents with  . Shortness of Breath    HPI Kelly Benjamin is a 78 y.o. female.  The history is provided by the patient, a relative and medical records. No language interpreter was used.  Shortness of Breath  This is a new problem. The average episode lasts 4 days. The problem occurs continuously.The current episode started more than 2 days ago. The problem has been gradually worsening. Associated symptoms include leg swelling. Pertinent negatives include no fever, no headaches, no coryza, no rhinorrhea, no neck pain, no cough, no sputum production, no hemoptysis, no wheezing, no chest pain, no syncope, no vomiting, no abdominal pain, no rash and no leg pain. She has tried nothing for the symptoms. The treatment provided no relief. Associated medical issues include COPD, CAD and past MI. Associated medical issues do not include DVT.    Past Medical History:  Diagnosis Date  . Breast cancer ([place]) 09/2017   left  . CAD in native artery 02/28/2016   2V CAD -medical rx  . COPD (chronic obstructive pulmonary disease) ([place])   . Dyslipidemia, goal LDL below 70   . Hypertension   . Ischemic cardiomyopathy    EF improved to 40-45% on Entresto  . Personal history of radiation therapy     Patient Active Problem List   Diagnosis Date Noted  . Dyslipidemia, goal LDL below 70   . Hyperlipidemia 07/09/2017  . CHF NYHA class III, acute on chronic, systolic    . CAD in native artery   . Hypoxia   . SOB (shortness of breath)   . History of non-ST elevation myocardial infarction (NSTEMI)   . Ischemic cardiomyopathy   . Abnormal nuclear stress test   . COPD (chronic obstructive pulmonary disease) ([place]) 02/28/2016  . Elevated troponin 02/28/2016  . Hyperglycemia 02/28/2016    Past Surgical History:  Procedure  Laterality Date  . APPENDECTOMY    . BREAST LUMPECTOMY  09/2017  . BREAST SURGERY  2000  . CARDIAC CATHETERIZATION N/A 03/02/2016   Procedure: Left Heart Cath and Coronary Angiography;  Surgeon: Belva Crome, MD;  Location: [facility];  Service: Cardiovascular;  Laterality: N/A;  . CATARACT EXTRACTION    . KNEE SURGERY       OB History    Gravida  0   Para  0   Term  0   Preterm  0   AB  0   Living  0     SAB  0   TAB  0   Ectopic  0   Multiple  0   Live Births  0            Home Medications    Prior to Admission medications   Medication Sig Start Date End Date Taking? Authorizing Provider  aspirin EC 81 MG tablet Take 1 tablet (81 mg total) by mouth daily. 03/05/16   Bonnielee Haff, MD  atorvastatin (LIPITOR) 40 MG tablet TAKE 1 TABLET BY MOUTH EVERY DAY 10/11/17   Lorretta Harp, MD  clopidogrel (PLAVIX) 75 MG tablet TAKE 1 TABLET BY MOUTH EVERY DAY 01/21/17   Lorretta Harp, MD  ENTRESTO 97-103 MG TAKE 1 TABLET BY MOUTH 2 (TWO) TIMES DAILY. 01/13/18   Lorretta Harp, MD  ezetimibe (ZETIA) 10 MG tablet TAKE 1  TABLET EVERY DAY 12/18/17   Lorretta Harp, MD  fluticasone furoate-vilanterol (BREO ELLIPTA) 200-25 MCG/INH AEPB Inhale 1 puff into the lungs daily. 03/06/16   Bonnielee Haff, MD  furosemide (LASIX) 40 MG tablet TAKE 1 TABLET BY MOUTH EVERY DAY 01/21/17   Lorretta Harp, MD  isosorbide mononitrate (IMDUR) 30 MG 24 hr tablet TAKE 1 TABLET BY MOUTH EVERY DAY 01/21/17   Lorretta Harp, MD  metoprolol succinate (TOPROL-XL) 25 MG 24 hr tablet TAKE 1 TABLET BY MOUTH EVERY DAY 01/21/17   Lorretta Harp, MD  Multiple Vitamins-Minerals (ICAPS AREDS 2) CAPS Take 1 capsule by mouth 2 (two) times daily.    [provider]  SPIRIVA HANDIHALER 18 MCG inhalation capsule INHALE CONTENTS OF 1 CAPSULE EVERY DAY 02/01/17   Lorretta Harp, MD    Family History Family History  Problem Relation Age of Onset  . Heart attack Father      Social History Social History   Tobacco Use  . Smoking status: Former Research scientist (life sciences)  . Smokeless tobacco: Former Network engineer Use Topics  . Alcohol use: Yes    Comment: occassionally  . Drug use: No     Allergies   Sulfa antibiotics   Review of Systems Review of Systems  Constitutional: Positive for fatigue. Negative for chills, diaphoresis and fever.  HENT: Negative for congestion and rhinorrhea.   Respiratory: Positive for shortness of breath. Negative for cough, hemoptysis, sputum production, chest tightness and wheezing.   Cardiovascular: Positive for leg swelling. Negative for chest pain and syncope.  Gastrointestinal: Negative for abdominal pain, anal bleeding, constipation, diarrhea, nausea and vomiting.  Genitourinary: Negative for flank pain.  Musculoskeletal: Negative for back pain, neck pain and neck stiffness.  Skin: Negative for rash and wound.  Neurological: Negative for dizziness, light-headedness and headaches.  Psychiatric/Behavioral: Negative for agitation.  All other systems reviewed and are negative.    Physical Exam Updated Vital Signs BP 94/77 (BP Location: Left Arm)   Pulse 82   Temp 97.7 F (36.5 C) (Oral)   Resp 18   Ht 5\' 10"  (1.778 m)   Wt 63.5 kg   SpO2 99%   BMI 20.09 kg/m   Physical Exam Vitals signs and nursing note reviewed.  Constitutional:      General: She is not in acute distress.    Appearance: She is well-developed. She is not ill-appearing, toxic-appearing or diaphoretic.  HENT:     Head: Normocephalic and atraumatic.  Eyes:     Extraocular Movements: Extraocular movements intact.     Conjunctiva/sclera: Conjunctivae normal.     Pupils: Pupils are equal, round, and reactive to light.  Neck:     Musculoskeletal: Normal range of motion and neck supple.  Cardiovascular:     Rate and Rhythm: Normal rate and regular rhythm.     Heart sounds: No murmur.  Pulmonary:     Effort: Pulmonary effort is normal. No tachypnea or  respiratory distress.     Breath sounds: Rales present. No wheezing or rhonchi.  Abdominal:     Palpations: Abdomen is soft.     Tenderness: There is no abdominal tenderness.  Musculoskeletal:     Right lower leg: She exhibits no tenderness. Edema present.     Left lower leg: She exhibits no tenderness. Edema present.  Skin:    General: Skin is warm and dry.     Capillary Refill: Capillary refill takes less than 2 seconds.  Neurological:     General:  No focal deficit present.     Mental Status: She is alert.  Psychiatric:        Mood and Affect: Mood normal.      ED Treatments / Results  Labs (all labs ordered are listed, but only abnormal results are displayed) Labs Reviewed  BASIC METABOLIC PANEL - Abnormal; Notable for the following components:      Result Value   Glucose, Bld 127 (*)    GFR calc non Af Amer 58 (*)    All other components within normal limits  BRAIN NATRIURETIC PEPTIDE - Abnormal; Notable for the following components:   B Natriuretic Peptide 3,356.1 (*)    All other components within normal limits  CBC - Abnormal; Notable for the following components:   RBC 2.45 (*)    Hemoglobin 5.6 (*)    HCT 20.0 (*)    MCH 22.9 (*)    MCHC 28.0 (*)    RDW 18.2 (*)    All other components within normal limits  I-STAT TROPONIN, ED - Abnormal; Notable for the following components:   Troponin i, poc 0.36 (*)    All other components within normal limits  CULTURE, BLOOD (ROUTINE X 2)  CULTURE, BLOOD (ROUTINE X 2)  EXPECTORATED SPUTUM ASSESSMENT W REFEX TO RESP CULTURE  GRAM STAIN  STREP PNEUMONIAE URINARY ANTIGEN  INFLUENZA PANEL BY PCR (TYPE A & B)  I-STAT CG4 LACTIC ACID, ED  I-STAT CG4 LACTIC ACID, ED  POC OCCULT BLOOD, ED  I-STAT TROPONIN, ED  PREPARE RBC (CROSSMATCH)  TYPE AND SCREEN  ABO/RH    EKG EKG Interpretation  Date/Time:  Saturday March 08 2018 09:56:59 EST Ventricular Rate:  76 PR Interval:  156 QRS Duration: 100 QT Interval:  358 QTC  Calculation: 402 R Axis:   92 Text Interpretation:  ** Suspect arm lead reversal, interpretation assumes no reversal Normal sinus rhythm Rightward axis Nonspecific ST and T wave abnormality Abnormal ECG When compared to prior, less pvc.  No STEMI Confirmed by Antony Blackbird [phone]) on 03/24/2018 10:17:42 AM   Radiology Dg Chest 2 View  Result Date: 03/05/2018 CLINICAL DATA:  Chest pressure and shortness of breath on exertion. EXAM: CHEST - 2 VIEW COMPARISON:  03/03/2016 FINDINGS: The heart size appears within normal limits. Aortic atherosclerosis. Masslike consolidation within the left upper lobe is identified. This is new when compared with the previous exam. Mild diffuse edema and small left pleural effusion. IMPRESSION: 1. Masslike consolidation within the left upper lobe is identified. In the acute setting is favored to represent pneumonia. In the absence of signs or symptoms of infection consider CT of the chest with contrast material to assess for underlying malignancy in this patient who has a history of smoking and may be at increased risk for lung cancer. 2. Small left pleural effusion and edema. Electronically Signed   By: Kerby Moors M.D.   On: 03/26/2018 10:52    Procedures Procedures (including critical care time)  CRITICAL CARE Performed by: Gwenyth Allegra Rowyn Spilde Total critical care time: 45 minutes Critical care time was exclusive of separately billable procedures and treating other patients. Critical care was necessary to treat or prevent imminent or life-threatening deterioration. Critical care was time spent personally by me on the following activities: development of treatment plan with patient and/or surrogate as well as nursing, discussions with consultants, evaluation of patient's response to treatment, examination of patient, obtaining history from patient or surrogate, ordering and performing treatments and interventions, ordering and review of laboratory  studies, ordering  and review of radiographic studies, pulse oximetry and re-evaluation of patient's condition.   Medications Ordered in ED Medications  aspirin EC tablet 81 mg (has no administration in time range)  fluticasone furoate-vilanterol (BREO ELLIPTA) 200-25 MCG/INH 1 puff (has no administration in time range)  ICAPS AREDS 2 CAPS 1 capsule (has no administration in time range)  sodium chloride flush (NS) 0.9 % injection 3 mL (has no administration in time range)  sodium chloride flush (NS) 0.9 % injection 3 mL (has no administration in time range)  0.9 %  sodium chloride infusion (has no administration in time range)  cefTRIAXone (ROCEPHIN) 1 g in sodium chloride 0.9 % 100 mL IVPB (has no administration in time range)  azithromycin (ZITHROMAX) 500 mg in sodium chloride 0.9 % 250 mL IVPB (has no administration in time range)  aspirin chewable tablet 243 mg (243 mg Oral Given 03/21/2018 1109)     Initial Impression / Assessment and Plan / ED Course  I have reviewed the triage vital signs and the nursing notes.  Pertinent labs & imaging results that were available during my care of the patient were reviewed by me and considered in my medical decision making (see chart for details).     Kelly Benjamin is a 78 y.o. female with a past medical history significant for CAD with prior MI, hyperlipidemia, COPD, and prior breast surgery who presents with shortness of breath.  Patient reports that for the last few days she has had exertional shortness of breath.  She normally is able to walk without any shortness of breath and now cannot walk more than several feet without getting winded.  Patient reports severe fatigue.  She denies any chest pain or chest pressure.  She says that her last heart attack also had no chest discomfort and was only discovered with shortness of breath.  She reports this feels similar to last MI.  She denies any recent cough, congestion, fevers, chills, nausea, vomiting, constipation,  diarrhea.  She reports her urine is slightly decreased than baseline.  No recent trauma.  She reports it is harder to breathe when she lays flat.  She reports her legs been more swollen for the last few weeks.  On exam, patient has crackles in the bases of both of her lungs.  No significant rhonchi.  No murmur.  Chest was nontender, back nontender.  Abdomen nontender.  Legs are edematous bilaterally.  EKG shows no evidence of STEMI.  Patient will have screening work-up and laboratory testing.  Patient's initial troponin was 0.36.  This is elevated from prior.  Patient will be given the rest of 324 of aspirin as she took 81 mg this morning.  Chest x-ray shows a masslike consolidation in the left upper lung.  Given her lack of cough, congestion, fevers, chills, lower suspicion for pneumonia and more concern for a mass.  Patient will likely need CT scan to further evaluate.  However, given the concern for symptoms same as prior MI elevated troponin, cardiology will be called initially.  Anticipate admission.  Patient's BNP came back elevated at 3300 and hemoglobin returned at 5.6.  Suspect a component of symptomatic anemia contributing to her symptoms.  Cardiology was called who recommended admission to medicine service and they will see in consultation.  Patient will given 2 units of blood for symptomatic anemia with elevated troponin, evidence of fluid overload with possible new heart failure, and exertional shortness of breath.  Medicine will admit.  Final Clinical Impressions(s) / ED Diagnoses   Final diagnoses:  Exertional shortness of breath  Elevated troponin  Peripheral edema  Symptomatic anemia    Clinical Impression: 1. Exertional shortness of breath   2. Elevated troponin   3. Peripheral edema   4. Symptomatic anemia     Disposition: Admit  This note was prepared with assistance of Dragon voice recognition software. Occasional wrong-word or sound-a-like substitutions  may have occurred due to the inherent limitations of voice recognition software.     Noelie Renfrow, Gwenyth Allegra, MD 03/22/2018 [phone]

## 2018-03-08 NOTE — Consult Note (Signed)
Cardiology Consultation:   Patient ID: MANDY PEEKS MRN: [id number]; DOB: [birth year]  Admit date: 03/13/2018 Date of Consult: 03/12/2018  Primary Care Provider: Maurice Small, MD Primary Cardiologist: Dr. Gwenlyn Found   Patient Profile:   PIERRE CUMPTON is a 78 y.o. female with a hx of CAD with history of NSTEMI, ischemic cardiomyopathy with most recent EF 40-45% who is being seen today for the evaluation of shortness of breath and elevated troponin at the request of Dr. Sherry Ruffing.  History of Present Illness:   Ms. Larock reports increasing shortness of breath and leg swelling for the last several days. She has not had any recent changes to her medications. Denies fevers, chills. No chest pain. No syncope.   She notes that her bathroom is only a short distance away, and she felt extremely short of breath even walking that distance. She feels very fatigued with minimal exertion. We did review that her anginal symptoms prior to her last heart attack were not chest pain or similar symptoms but shortness of breath.   On review of her labs, she has a very large drop in her Hgb, from 11.3 to 5.6. She denies any evidence of active bleeding, including hemoptysis, hematemesis, melena, hematochezia, or hematuria.   BNP elevated at 3356, Tn elevated at 0.36  Past Medical History:  Diagnosis Date  . Breast cancer ([facility]) 09/2017   left  . CAD in native artery 02/28/2016   2V CAD -medical rx  . COPD (chronic obstructive pulmonary disease) ([facility])   . Dyslipidemia, goal LDL below 70   . Hypertension   . Ischemic cardiomyopathy    EF improved to 40-45% on Entresto  . Personal history of radiation therapy     Past Surgical History:  Procedure Laterality Date  . APPENDECTOMY    . BREAST LUMPECTOMY  09/2017  . BREAST SURGERY  2000  . CARDIAC CATHETERIZATION N/A 03/02/2016   Procedure: Left Heart Cath and Coronary Angiography;  Surgeon: Belva Crome, MD;  Location: [facility];  Service:  Cardiovascular;  Laterality: N/A;  . CATARACT EXTRACTION    . KNEE SURGERY       Home Medications:  Prior to Admission medications   Medication Sig Start Date End Date Taking? Authorizing Provider  aspirin EC 81 MG tablet Take 1 tablet (81 mg total) by mouth daily. 03/05/16  Yes Bonnielee Haff, MD  atorvastatin (LIPITOR) 40 MG tablet TAKE 1 TABLET BY MOUTH EVERY DAY Patient taking differently: Take 40 mg by mouth at bedtime.  10/11/17  Yes Lorretta Harp, MD  clopidogrel (PLAVIX) 75 MG tablet TAKE 1 TABLET BY MOUTH EVERY DAY Patient taking differently: Take 75 mg by mouth daily.  01/21/17  Yes Lorretta Harp, MD  ENTRESTO 97-103 MG TAKE 1 TABLET BY MOUTH 2 (TWO) TIMES DAILY. Patient taking differently: Take 1 tablet by mouth 2 (two) times daily.  01/13/18  Yes Lorretta Harp, MD  ezetimibe (ZETIA) 10 MG tablet TAKE 1 TABLET EVERY DAY Patient taking differently: Take 10 mg by mouth daily.  12/18/17  Yes Lorretta Harp, MD  fluticasone furoate-vilanterol (BREO ELLIPTA) 200-25 MCG/INH AEPB Inhale 1 puff into the lungs daily. 03/06/16  Yes Bonnielee Haff, MD  furosemide (LASIX) 40 MG tablet TAKE 1 TABLET BY MOUTH EVERY DAY Patient taking differently: Take 40 mg by mouth daily.  01/21/17  Yes Lorretta Harp, MD  isosorbide mononitrate (IMDUR) 30 MG 24 hr tablet TAKE 1 TABLET BY MOUTH EVERY DAY Patient taking  differently: Take 30 mg by mouth daily.  01/21/17  Yes Lorretta Harp, MD  metoprolol succinate (TOPROL-XL) 25 MG 24 hr tablet TAKE 1 TABLET BY MOUTH EVERY DAY Patient taking differently: Take 25 mg by mouth daily.  01/21/17  Yes Lorretta Harp, MD  Multiple Vitamins-Minerals (ICAPS AREDS 2) CAPS Take 1 capsule by mouth 2 (two) times daily.   Yes [provider]  SPIRIVA HANDIHALER 18 MCG inhalation capsule INHALE CONTENTS OF 1 CAPSULE EVERY DAY Patient taking differently: Place 18 mcg into inhaler and inhale.  02/01/17  Yes Lorretta Harp, MD    Inpatient  Medications: Scheduled Meds: . aspirin EC  81 mg Oral Daily  . fluticasone furoate-vilanterol  1 puff Inhalation Daily  . ICAPS AREDS 2  1 capsule Oral BID  . sodium chloride flush  3 mL Intravenous Q12H   Continuous Infusions: . sodium chloride    . azithromycin    . cefTRIAXone (ROCEPHIN)  IV     PRN Meds: sodium chloride, sodium chloride flush  Allergies:    Allergies  Allergen Reactions  . Sulfa Antibiotics Swelling    Social History:   Social History   Socioeconomic History  . Marital status: Single    Spouse name: Not on file  . Number of children: Not on file  . Years of education: Not on file  . Highest education level: Not on file  Occupational History  . Not on file  Social Needs  . Financial resource strain: Not on file  . Food insecurity:    Worry: Not on file    Inability: Not on file  . Transportation needs:    Medical: Not on file    Non-medical: Not on file  Tobacco Use  . Smoking status: Former Research scientist (life sciences)  . Smokeless tobacco: Former Network engineer and Sexual Activity  . Alcohol use: Yes    Comment: occassionally  . Drug use: No  . Sexual activity: Not on file  Lifestyle  . Physical activity:    Days per week: Not on file    Minutes per session: Not on file  . Stress: Not on file  Relationships  . Social connections:    Talks on phone: Not on file    Gets together: Not on file    Attends religious service: Not on file    Active member of club or organization: Not on file    Attends meetings of clubs or organizations: Not on file    Relationship status: Not on file  . Intimate partner violence:    Fear of current or ex partner: Not on file    Emotionally abused: Not on file    Physically abused: Not on file    Forced sexual activity: Not on file  Other Topics Concern  . Not on file  Social History Narrative  . Not on file    Family History:    Family History  Problem Relation Age of Onset  . Heart attack Father      ROS:    Please see the history of present illness.  Review of Systems  Constitutional: Positive for malaise/fatigue. Negative for chills and fever.  HENT: Negative for nosebleeds.   Eyes: Negative for double vision and discharge.  Respiratory: Positive for shortness of breath. Negative for hemoptysis.   Cardiovascular: Positive for orthopnea and leg swelling. Negative for chest pain, palpitations, claudication and PND.  Gastrointestinal: Negative for abdominal pain, blood in stool and melena.  Genitourinary: Negative for  dysuria and hematuria.  Musculoskeletal: Negative for falls and myalgias.  Neurological: Negative for focal weakness and loss of consciousness.  Endo/Heme/Allergies: Bruises/bleeds easily.   All other ROS reviewed and negative.     Physical Exam/Data:   Vitals:   03/07/2018 1230 03/16/2018 1300 03/07/2018 1330 03/28/2018 1400  BP: (!) 97/49 (!) 109/57 114/64 (!) 106/54  Pulse: 73 74 79 76  Resp: 19 14 19  (!) 23  Temp:      TempSrc:      SpO2: 94% 94% 100% 90%  Weight:      Height:       No intake or output data in the 24 hours ending 03/14/2018 1441 Filed Weights   03/28/2018 0954  Weight: 63.5 kg   Body mass index is 20.09 kg/m.  General:  Frail appearing, in no acute distress HEENT: normal Lymph: no adenopathy Neck: JVD elevated to mid neck at 45 degrees Endocrine:  No thryomegaly Vascular: No carotid bruits; RA pulses 2+  Cardiac:  normal S1, S2; RRR; no murmur Lungs:  Diminished breath sounds with rales in bilateral bases Abd: soft, nontender, no hepatomegaly  Ext: bilateral 2+ pitting LE edema Musculoskeletal:  No deformities, BUE and BLE strength normal and equal Skin: warm and dry  Neuro:  no focal abnormalities noted Psych:  Normal affect   EKG:  The EKG was personally reviewed and demonstrates:  NSR Telemetry:  Telemetry was personally reviewed and demonstrates:  SR  Relevant CV Studies: Echo 06/2016 - Left ventricle: The cavity size was normal. There was  mild   concentric hypertrophy. Systolic function was mildly to   moderately reduced. The estimated ejection fraction was in the   range of 40% to 45%. Hypokinesis of the anterior and   anterolateral with akinesis of the inferolateral myocardium.   Doppler parameters are consistent with abnormal left ventricular   relaxation (grade 1 diastolic dysfunction). Doppler parameters   are consistent with indeterminate ventricular filling pressure. - Aortic valve: Transvalvular velocity was within the normal range.   There was no stenosis. There was no regurgitation. - Mitral valve: Transvalvular velocity was within the normal range.   There was no evidence for stenosis. There was mild regurgitation. - Right ventricle: Systolic function was normal. - Atrial septum: No defect or patent foramen ovale was identified   by color flow Doppler. - Tricuspid valve: There was trivial regurgitation. - Pulmonary arteries: Systolic pressure was within the normal   range. PA peak pressure: 21 mm Hg (S).  Laboratory Data:  Chemistry Recent Labs  Lab 03/28/2018 0957  NA 137  K 3.6  CL 105  CO2 22  GLUCOSE 127*  BUN 17  CREATININE 0.95  CALCIUM 9.2  GFRNONAA 58*  GFRAA >60  ANIONGAP 10    No results for input(s): PROT, ALBUMIN, AST, ALT, ALKPHOS, BILITOT in the last 168 hours. Hematology Recent Labs  Lab 03/30/2018 1243  WBC 10.1  RBC 2.45*  HGB 5.6*  HCT 20.0*  MCV 81.6  MCH 22.9*  MCHC 28.0*  RDW 18.2*  PLT 335   Cardiac EnzymesNo results for input(s): TROPONINI in the last 168 hours.  Recent Labs  Lab 03/25/2018 1008  TROPIPOC 0.36*    BNP Recent Labs  Lab 03/26/2018 0957  BNP 3,356.1*    DDimer No results for input(s): DDIMER in the last 168 hours.  Radiology/Studies:  Dg Chest 2 View  Result Date: 03/15/2018 CLINICAL DATA:  Chest pressure and shortness of breath on exertion. EXAM: CHEST - 2  VIEW COMPARISON:  03/03/2016 FINDINGS: The heart size appears within normal limits.  Aortic atherosclerosis. Masslike consolidation within the left upper lobe is identified. This is new when compared with the previous exam. Mild diffuse edema and small left pleural effusion. IMPRESSION: 1. Masslike consolidation within the left upper lobe is identified. In the acute setting is favored to represent pneumonia. In the absence of signs or symptoms of infection consider CT of the chest with contrast material to assess for underlying malignancy in this patient who has a history of smoking and may be at increased risk for lung cancer. 2. Small left pleural effusion and edema. Electronically Signed   By: Kerby Moors M.D.   On: 03/10/2018 10:52    Assessment and Plan:   Volume overload: suggests acute on chronic systolic heart failure, with JVD, rales, elevated BNP -repeat echocardiogram -would start diuresis, aim for >1L negative in 24 hours. Starting with BID IV lasix -monitor electrolytes -continue metoprolol given elevated troponin, she is warm on exam -would hold entresto given borderline blood pressures  Acute anemia of unknown etiology: pending transfusion. Needs workup for etiology of this per primary team  Elevated troponin: concerning for ACS, but with anemia of unknown origin would be concerned for worsening with anticoagulation. Cannot cath until etiology of anemia is determined and corrected. -trend troponins -hold aspirin, plavix, no heparin while working up anemia -continue atorvastatin, zetia -continue metoprolol -hold imdur given borderline blood pressures   For questions or updates, please contact [place] Please consult [email or web address] for contact info under   Signed, Buford Dresser, MD  03/06/2018 2:41 PM

## 2018-03-08 NOTE — ED Notes (Signed)
Istat Trop result of 0.36 read to MD Tegeler.

## 2018-03-08 NOTE — ED Notes (Signed)
Lab contacted RN stating patients lavender tube was spun down to run BNP prior to running the CBC. Lab redrawn and sent down. MD aware.

## 2018-03-08 NOTE — ED Triage Notes (Signed)
Pt states she has been feeling SOB for two days. Worsens with ambulation. Denies CP. Pt states her ankles and legs are swollen- for two weeks. Pt states she has had an MI in the past.

## 2018-03-08 NOTE — ED Notes (Signed)
Patient transported to CT 

## 2018-03-08 NOTE — ED Notes (Signed)
ED TO INPATIENT HANDOFF REPORT  Name/Age/Gender Kelly Benjamin 78 y.o. female  Code Status    Code Status Orders  (From admission, onward)         Start     Ordered   03/10/2018 1429  Full code  Continuous     03/10/2018 1430        Code Status History    Date Active Date Inactive Code Status Order ID Comments User Context   02/28/2016 2315 03/05/2016 1414 Full Code [id number]  Jani Gravel, MD Inpatient      Home/SNF/Other Home  Chief Complaint sob  Level of Care/Admitting Diagnosis ED Disposition    ED Disposition Condition [place]: [facility] [[id number]]  Level of Care: Medical Telemetry [104]  Diagnosis: Anemia [330076]  Admitting Physician: Phillips Grout [[id number]]  Attending Physician: Derrill Kay A [[id number]]  Estimated length of stay: 3 - 4 days  Certification:: I certify this patient will need inpatient services for at least 2 midnights  PT Class (Do Not Modify): Inpatient [101]  PT Acc Code (Do Not Modify): Private [1]       Medical History Past Medical History:  Diagnosis Date  . Breast cancer ([facility]) 09/2017   left  . CAD in native artery 02/28/2016   2V CAD -medical rx  . COPD (chronic obstructive pulmonary disease) ([facility])   . Dyslipidemia, goal LDL below 70   . Hypertension   . Ischemic cardiomyopathy    EF improved to 40-45% on Entresto  . Personal history of radiation therapy     Allergies Allergies  Allergen Reactions  . Sulfa Antibiotics Swelling    IV Location/Drains/Wounds Patient Lines/Drains/Airways Status   Active Line/Drains/Airways    Name:   Placement date:   Placement time:   Site:   Days:   Peripheral IV 03/09/2018 Left Antecubital   03/18/2018    0953    Antecubital   less than 1   Peripheral IV 03/11/2018 Left Forearm   03/29/2018    1344    Forearm   less than 1          Labs/Imaging Results for orders placed or performed during the hospital encounter of 03/28/2018 (from the past 48 hour(s))   Basic metabolic panel     Status: Abnormal   Collection Time: 03/30/2018  9:57 AM  Result Value Ref Range   Sodium 137 135 - 145 mmol/L   Potassium 3.6 3.5 - 5.1 mmol/L   Chloride 105 98 - 111 mmol/L   CO2 22 22 - 32 mmol/L   Glucose, Bld 127 (H) 70 - 99 mg/dL   BUN 17 8 - 23 mg/dL   Creatinine, Ser 0.95 0.44 - 1.00 mg/dL   Calcium 9.2 8.9 - 10.3 mg/dL   GFR calc non Af Amer 58 (L) >60 mL/min   GFR calc Af Amer >60 >60 mL/min   Anion gap 10 5 - 15    Comment: Performed at [facility], [place] [address] [place], [place] [address]  Brain natriuretic peptide     Status: Abnormal   Collection Time: 03/12/2018  9:57 AM  Result Value Ref Range   B Natriuretic Peptide 3,356.1 (H) 0.0 - 100.0 pg/mL    Comment: Performed at [facility] [address] [place], [place] [address]  I-stat troponin, ED     Status: Abnormal   Collection Time: 03/18/2018 10:08 AM  Result Value Ref Range  Troponin i, poc 0.36 (HH) 0.00 - 0.08 ng/mL   Comment NOTIFIED PHYSICIAN    Comment 3            Comment: Due to the release kinetics of cTnI, a negative result within the first hours of the onset of symptoms does not rule out myocardial infarction with certainty. If myocardial infarction is still suspected, repeat the test at appropriate intervals.   I-Stat CG4 Lactic Acid, ED     Status: None   Collection Time: 03/07/2018 11:19 AM  Result Value Ref Range   Lactic Acid, Venous 1.46 0.5 - 1.9 mmol/L  CBC     Status: Abnormal   Collection Time: 04/01/2018 12:43 PM  Result Value Ref Range   WBC 10.1 4.0 - 10.5 K/uL   RBC 2.45 (L) 3.87 - 5.11 MIL/uL   Hemoglobin 5.6 (LL) 12.0 - 15.0 g/dL    Comment: REPEATED TO VERIFY THIS CRITICAL RESULT HAS VERIFIED AND BEEN CALLED TO RN K RAND BY LESLIE BENFIELD ON 01 04 2020 AT 1316, AND HAS BEEN READ BACK.     HCT 20.0 (L) 36.0 - 46.0 %   MCV 81.6 80.0 - 100.0 fL   MCH 22.9 (L) 26.0 - 34.0 pg   MCHC 28.0 (L) 30.0 - 36.0 g/dL   RDW 18.2 (H) 11.5 - 15.5 %    Platelets 335 150 - 400 K/uL   nRBC 0.0 0.0 - 0.2 %    Comment: Performed at [facility] [address] [place], [place] [address]  Prepare RBC     Status: None   Collection Time: 03/22/2018  1:34 PM  Result Value Ref Range   Order Confirmation      ORDER PROCESSED BY BLOOD BANK Performed at [facility], [place] [address], [place], [place] [address]   Type and screen [place]     Status: None (Preliminary result)   Collection Time: 03/05/2018  1:34 PM  Result Value Ref Range   ABO/RH(D) PENDING    Antibody Screen PENDING    Sample Expiration      03/11/2018 Performed at [facility], [place] [address] [place], [place] [address]   I-Stat CG4 Lactic Acid, ED     Status: None   Collection Time: 03/31/2018  1:45 PM  Result Value Ref Range   Lactic Acid, Venous 1.11 0.5 - 1.9 mmol/L   Dg Chest 2 View  Result Date: 03/07/2018 CLINICAL DATA:  Chest pressure and shortness of breath on exertion. EXAM: CHEST - 2 VIEW COMPARISON:  03/03/2016 FINDINGS: The heart size appears within normal limits. Aortic atherosclerosis. Masslike consolidation within the left upper lobe is identified. This is new when compared with the previous exam. Mild diffuse edema and small left pleural effusion. IMPRESSION: 1. Masslike consolidation within the left upper lobe is identified. In the acute setting is favored to represent pneumonia. In the absence of signs or symptoms of infection consider CT of the chest with contrast material to assess for underlying malignancy in this patient who has a history of smoking and may be at increased risk for lung cancer. 2. Small left pleural effusion and edema. Electronically Signed   By: Kerby Moors M.D.   On: 03/07/2018 10:52     Pending Labs Unresulted Labs (From admission, onward)    Start     Ordered   03/09/18 5852  Basic metabolic panel  Tomorrow morning,   R     03/20/2018 1430  03/09/18 0500  CBC WITH DIFFERENTIAL  Tomorrow morning,   R      03/15/2018 1430   03/14/2018 1429  Influenza panel by PCR (type A & B)  (Influenza PCR Panel)  Once,   R     04/01/2018 1430   03/29/2018 1428  Culture, blood (routine x 2) Call MD if unable to obtain prior to antibiotics being given  BLOOD CULTURE X 2,   R    Comments:  If blood cultures drawn in Emergency Department - Do not draw and cancel order    03/19/2018 1430   03/27/2018 1428  Culture, sputum-assessment  Once,   R     03/27/2018 1430   03/15/2018 1428  Gram stain  Once,   R     04/02/2018 1430   04/04/2018 1428  Strep pneumoniae urinary antigen  Once,   R     03/22/2018 1430          Vitals/Pain Today's Vitals   03/07/2018 1230 03/16/2018 1300 03/18/2018 1330 03/21/2018 1400  BP: (!) 97/49 (!) 109/57 114/64 (!) 106/54  Pulse: 73 74 79 76  Resp: 19 14 19  (!) 23  Temp:      TempSrc:      SpO2: 94% 94% 100% 90%  Weight:      Height:      PainSc:        Isolation Precautions Droplet precaution  Medications Medications  aspirin EC tablet 81 mg (has no administration in time range)  fluticasone furoate-vilanterol (BREO ELLIPTA) 200-25 MCG/INH 1 puff (has no administration in time range)  ICAPS AREDS 2 CAPS 1 capsule (has no administration in time range)  sodium chloride flush (NS) 0.9 % injection 3 mL (has no administration in time range)  sodium chloride flush (NS) 0.9 % injection 3 mL (has no administration in time range)  0.9 %  sodium chloride infusion (has no administration in time range)  cefTRIAXone (ROCEPHIN) 1 g in sodium chloride 0.9 % 100 mL IVPB (has no administration in time range)  azithromycin (ZITHROMAX) 500 mg in sodium chloride 0.9 % 250 mL IVPB (has no administration in time range)  aspirin chewable tablet 243 mg (243 mg Oral Given 03/28/2018 1109)    Mobility walks

## 2018-03-09 ENCOUNTER — Inpatient Hospital Stay (HOSPITAL_COMMUNITY): Payer: Medicare PPO

## 2018-03-09 ENCOUNTER — Encounter (HOSPITAL_COMMUNITY): Payer: Self-pay | Admitting: Gastroenterology

## 2018-03-09 DIAGNOSIS — R7989 Other specified abnormal findings of blood chemistry: Secondary | ICD-10-CM

## 2018-03-09 LAB — HEMOGLOBIN AND HEMATOCRIT, BLOOD
HEMATOCRIT: 29 % — AB (ref 36.0–46.0)
Hemoglobin: 8.8 g/dL — ABNORMAL LOW (ref 12.0–15.0)

## 2018-03-09 LAB — TROPONIN I
TROPONIN I: 1.08 ng/mL — AB (ref <0.03)
Troponin I: 1.35 ng/mL (ref <0.03)
Troponin I: 1.3 ng/mL (ref <0.03)

## 2018-03-09 LAB — TYPE AND SCREEN
ABO/RH(D): A POS
Antibody Screen: NEGATIVE
Unit division: 0
Unit division: 0

## 2018-03-09 LAB — RETICULOCYTES
Immature Retic Fract: 42.3 % — ABNORMAL HIGH (ref 2.3–15.9)
RBC.: 3.43 MIL/uL — AB (ref 3.87–5.11)
RETIC COUNT ABSOLUTE: 59.7 10*3/uL (ref 19.0–186.0)
Retic Ct Pct: 1.7 % (ref 0.4–3.1)

## 2018-03-09 LAB — BPAM RBC
Blood Product Expiration Date: 202001212359
Blood Product Expiration Date: 202001212359
ISSUE DATE / TIME: 202001041841
ISSUE DATE / TIME: 202001042235
Unit Type and Rh: 6200
Unit Type and Rh: 6200

## 2018-03-09 LAB — IRON AND TIBC
Iron: 44 ug/dL (ref 28–170)
Saturation Ratios: 14 % (ref 10.4–31.8)
TIBC: 305 ug/dL (ref 250–450)
UIBC: 261 ug/dL

## 2018-03-09 LAB — CBC WITH DIFFERENTIAL/PLATELET
Abs Immature Granulocytes: 0.1 10*3/uL — ABNORMAL HIGH (ref 0.00–0.07)
Basophils Absolute: 0 10*3/uL (ref 0.0–0.1)
Basophils Relative: 0 %
Eosinophils Absolute: 0.1 10*3/uL (ref 0.0–0.5)
Eosinophils Relative: 0 %
HCT: 27.2 % — ABNORMAL LOW (ref 36.0–46.0)
Hemoglobin: 8.3 g/dL — ABNORMAL LOW (ref 12.0–15.0)
Immature Granulocytes: 1 %
Lymphocytes Relative: 4 %
Lymphs Abs: 0.6 10*3/uL — ABNORMAL LOW (ref 0.7–4.0)
MCH: 25.2 pg — AB (ref 26.0–34.0)
MCHC: 30.5 g/dL (ref 30.0–36.0)
MCV: 82.7 fL (ref 80.0–100.0)
MONO ABS: 0.8 10*3/uL (ref 0.1–1.0)
Monocytes Relative: 5 %
Neutro Abs: 12.9 10*3/uL — ABNORMAL HIGH (ref 1.7–7.7)
Neutrophils Relative %: 90 %
Platelets: 359 10*3/uL (ref 150–400)
RBC: 3.29 MIL/uL — ABNORMAL LOW (ref 3.87–5.11)
RDW: 17.7 % — ABNORMAL HIGH (ref 11.5–15.5)
WBC: 14.4 10*3/uL — ABNORMAL HIGH (ref 4.0–10.5)
nRBC: 0 % (ref 0.0–0.2)

## 2018-03-09 LAB — FERRITIN: Ferritin: 54 ng/mL (ref 11–307)

## 2018-03-09 LAB — BASIC METABOLIC PANEL
Anion gap: 10 (ref 5–15)
BUN: 20 mg/dL (ref 8–23)
CO2: 21 mmol/L — ABNORMAL LOW (ref 22–32)
Calcium: 8.9 mg/dL (ref 8.9–10.3)
Chloride: 104 mmol/L (ref 98–111)
Creatinine, Ser: 1.16 mg/dL — ABNORMAL HIGH (ref 0.44–1.00)
GFR calc Af Amer: 53 mL/min — ABNORMAL LOW (ref >60)
GFR calc non Af Amer: 46 mL/min — ABNORMAL LOW (ref >60)
Glucose, Bld: 144 mg/dL — ABNORMAL HIGH (ref 70–99)
Potassium: 3.8 mmol/L (ref 3.5–5.1)
Sodium: 135 mmol/L (ref 135–145)

## 2018-03-09 LAB — LACTIC ACID, PLASMA: Lactic Acid, Venous: 8.2 mmol/L (ref 0.5–1.9)

## 2018-03-09 LAB — GLUCOSE, CAPILLARY: Glucose-Capillary: 158 mg/dL — ABNORMAL HIGH (ref 70–99)

## 2018-03-09 LAB — FOLATE: Folate: 6.9 ng/mL (ref >5.9)

## 2018-03-09 LAB — STREP PNEUMONIAE URINARY ANTIGEN: Strep Pneumo Urinary Antigen: NEGATIVE

## 2018-03-09 LAB — VITAMIN B12: Vitamin B-12: 656 pg/mL (ref 180–914)

## 2018-03-09 MED ORDER — ATORVASTATIN CALCIUM 40 MG PO TABS
40.0000 mg | ORAL_TABLET | Freq: Every day | ORAL | Status: DC
  Administered 2018-03-09 – 2018-03-13 (×5): 40 mg via ORAL
  Filled 2018-03-09 (×5): qty 1

## 2018-03-09 MED ORDER — PREDNISONE 20 MG PO TABS
40.0000 mg | ORAL_TABLET | Freq: Every day | ORAL | Status: AC
  Administered 2018-03-09 – 2018-03-11 (×3): 40 mg via ORAL
  Filled 2018-03-09 (×3): qty 2

## 2018-03-09 MED ORDER — EZETIMIBE 10 MG PO TABS
10.0000 mg | ORAL_TABLET | Freq: Every day | ORAL | Status: DC
  Administered 2018-03-10 – 2018-03-18 (×8): 10 mg via ORAL
  Filled 2018-03-09 (×8): qty 1

## 2018-03-09 MED ORDER — METOPROLOL SUCCINATE ER 25 MG PO TB24
25.0000 mg | ORAL_TABLET | Freq: Every day | ORAL | Status: DC
  Administered 2018-03-10 – 2018-03-12 (×3): 25 mg via ORAL
  Filled 2018-03-09 (×5): qty 1

## 2018-03-09 MED ORDER — IPRATROPIUM-ALBUTEROL 0.5-2.5 (3) MG/3ML IN SOLN
3.0000 mL | Freq: Four times a day (QID) | RESPIRATORY_TRACT | Status: DC
  Administered 2018-03-09 – 2018-03-10 (×6): 3 mL via RESPIRATORY_TRACT
  Filled 2018-03-09 (×6): qty 3

## 2018-03-09 MED ORDER — FUROSEMIDE 10 MG/ML IJ SOLN
80.0000 mg | Freq: Once | INTRAMUSCULAR | Status: AC
  Administered 2018-03-09: 80 mg via INTRAVENOUS
  Filled 2018-03-09: qty 8

## 2018-03-09 MED ORDER — SODIUM BICARBONATE 650 MG PO TABS
650.0000 mg | ORAL_TABLET | Freq: Three times a day (TID) | ORAL | Status: DC
  Administered 2018-03-09 – 2018-03-14 (×15): 650 mg via ORAL
  Filled 2018-03-09 (×15): qty 1

## 2018-03-09 NOTE — Progress Notes (Signed)
PROGRESS NOTE    Kelly Benjamin  IRC:[id number] DOB: 22-Mar-1940 DOA: 03/22/2018 PCP: Maurice Small, MD    Brief Narrative: Kelly Benjamin is a 78 y.o. female with a hx of CAD with history of NSTEMI, ischemic cardiomyopathy with most recent EF 40-45%, hyperlipidemia, COPD, history of left breast cancer status post radiation and lumpectomy presents with severe dyspnea on exertion.  On arrival patient was found to have profound anemia with a hemoglobin of 5.6.  She was admitted for evaluation of symptomatic anemia and acute on chronic systolic heart failure and NSTEMI.   Assessment & Plan:   Principal Problem:   PNA (pneumonia) Active Problems:   COPD (chronic obstructive pulmonary disease) (HCC)   SOB (shortness of breath)   Ischemic cardiomyopathy   Normocytic anemia   NSTEMI (non-ST elevated myocardial infarction) (HCC)   Acute respiratory failure with hypoxia probably secondary to a combination of pneumonia, COPD and acute systolic heart failure  Patient was found to have left upper lobe community-acquired pneumonia. She was started on IV Rocephin and IV Zithromax.  Overnight she was febrile to 100.4.  Recommend to continue with IV antibiotics for another 24 to 48 hours.  Follow blood cultures. Nasal cannula oxygen to keep sats greater than 90%.  Sputum cultures ordered if able to get sputum.   Febrile with leukocytosis. Lactic acid within normal limits   Mild COPD exacerbation Start the patient on duo nebs and had 40 mg of prednisone daily.   Mild acute on chronic systolic heart failure BNP on admission was greater than 3000.  Patient was started on IV Lasix 40 mg twice daily and cardiology consulted.  ECHOCardiogram ordered and is pending now. Continue with strict intake and output and daily weights and watch renal parameters while on Lasix.   Acute on anemia of chronic disease Patient's baseline hemoglobin is around 11. She was admitted with a hemoglobin of 5.6, received  2 units of PRBC transfusion and repeat hemoglobin is 8.3 She denies any hematemesis, hematochezia, melena Stool for occult blood ordered and anemia panel is pending. Patient denies getting any previous colonoscopy or EGD. We will request gastroenterology to see if she needs an EGD   Elevated troponins probably from demand ischemia from heart failure and is symptomatic anemia. Further recommendations as per cardiology   History of coronary artery disease and ischemic cardiomyopathy Patient at home is on aspirin 81 mg and Plavix 75 mg daily. Currently she is chest pain-free.  Plavix held on admission and on aspirin 81 daily. Resume beta-blocker and statin.   Hyperlipidemia Resume Zetia and statin.     DVT prophylaxis: SCDs Code Status: Full code Family Communication: None at bedside Disposition Plan: Pending clinical improvement and further evaluation   Consultants:   Cardiology  Gastroenterology.   Procedures: Echocardiogram Antimicrobials: IV Rocephin and Zithromax since admission  Subjective: Patient reports shortness of breath.  Reports feeling exhausted  Objective: Vitals:   03/09/18 0120 03/09/18 0601 03/09/18 0747 03/09/18 0835  BP: 113/71 116/80 115/75   Pulse: 78 88 89   Resp: 18 18 18    Temp: 97.6 F (36.4 C) 98.3 F (36.8 C) 98.5 F (36.9 C)   TempSrc: Oral Oral Oral   SpO2: 92% 93% 95% 92%  Weight:  68 kg    Height:        Intake/Output Summary (Last 24 hours) at 03/09/2018 1129 Last data filed at 03/09/2018 0609 Gross per 24 hour  Intake 1019 ml  Output 300 ml  Net  719 ml   Filed Weights   03/16/2018 0954 03/06/2018 1703 03/09/18 0601  Weight: 63.5 kg 67.1 kg 68 kg    Examination:  General exam: Mild distress from shortness of breath. Respiratory system: Tachypnea, basilar Rales, scattered wheezing posteriorly Cardiovascular system: S1 & S2 heard, RRR. Gastrointestinal system: Abdomen is nondistended, soft and nontender.. Normal bowel  sounds heard. Central nervous system: Alert and oriented,  grossly nonfocal Extremities: Symmetric 5 x 5 power. Skin: No rashes, lesions or ulcers Psychiatry: Anxious looking    Data Reviewed: I have personally reviewed following labs and imaging studies  CBC: Recent Labs  Lab 03/13/2018 1243 03/09/18 0341  WBC 10.1 14.4*  NEUTROABS  --  12.9*  HGB 5.6* 8.3*  HCT 20.0* 27.2*  MCV 81.6 82.7  PLT 335 923   Basic Metabolic Panel: Recent Labs  Lab 03/22/2018 0957 03/09/18 0341  NA 137 135  K 3.6 3.8  CL 105 104  CO2 22 21*  GLUCOSE 127* 144*  BUN 17 20  CREATININE 0.95 1.16*  CALCIUM 9.2 8.9   GFR: Estimated Creatinine Clearance: 44.3 mL/min (A) (by C-G formula based on SCr of 1.16 mg/dL (H)). Liver Function Tests: No results for input(s): AST, ALT, ALKPHOS, BILITOT, PROT, ALBUMIN in the last 168 hours. No results for input(s): LIPASE, AMYLASE in the last 168 hours. No results for input(s): AMMONIA in the last 168 hours. Coagulation Profile: No results for input(s): INR, PROTIME in the last 168 hours. Cardiac Enzymes: Recent Labs  Lab 04/03/2018 1641 03/09/18 0341 03/09/18 0945  TROPONINI 0.73* 1.08* 1.35*   BNP (last 3 results) No results for input(s): PROBNP in the last 8760 hours. HbA1C: No results for input(s): HGBA1C in the last 72 hours. CBG: No results for input(s): GLUCAP in the last 168 hours. Lipid Profile: No results for input(s): CHOL, HDL, LDLCALC, TRIG, CHOLHDL, LDLDIRECT in the last 72 hours. Thyroid Function Tests: No results for input(s): TSH, T4TOTAL, FREET4, T3FREE, THYROIDAB in the last 72 hours. Anemia Panel: No results for input(s): VITAMINB12, FOLATE, FERRITIN, TIBC, IRON, RETICCTPCT in the last 72 hours. Sepsis Labs: Recent Labs  Lab 03/09/2018 1119 03/05/2018 1345  LATICACIDVEN 1.46 1.11    Recent Results (from the past 240 hour(s))  Culture, blood (routine x 2) Call MD if unable to obtain prior to antibiotics being given     Status:  None (Preliminary result)   Collection Time: 03/31/2018  4:14 PM  Result Value Ref Range Status   Specimen Description BLOOD RIGHT ARM  Final   Special Requests   Final    BOTTLES DRAWN AEROBIC AND ANAEROBIC Blood Culture results may not be optimal due to an inadequate volume of blood received in culture bottles   Culture NO GROWTH < 24 HOURS  Final   Report Status PENDING  Incomplete  Culture, blood (routine x 2) Call MD if unable to obtain prior to antibiotics being given     Status: None (Preliminary result)   Collection Time: 03/22/2018  4:21 PM  Result Value Ref Range Status   Specimen Description BLOOD RIGHT HAND  Final   Special Requests   Final    AEROBIC BOTTLE ONLY Blood Culture results may not be optimal due to an inadequate volume of blood received in culture bottles   Culture NO GROWTH < 24 HOURS  Final   Report Status PENDING  Incomplete         Radiology Studies: Dg Chest 2 View  Result Date: 04/02/2018 CLINICAL DATA:  Chest  pressure and shortness of breath on exertion. EXAM: CHEST - 2 VIEW COMPARISON:  03/03/2016 FINDINGS: The heart size appears within normal limits. Aortic atherosclerosis. Masslike consolidation within the left upper lobe is identified. This is new when compared with the previous exam. Mild diffuse edema and small left pleural effusion. IMPRESSION: 1. Masslike consolidation within the left upper lobe is identified. In the acute setting is favored to represent pneumonia. In the absence of signs or symptoms of infection consider CT of the chest with contrast material to assess for underlying malignancy in this patient who has a history of smoking and may be at increased risk for lung cancer. 2. Small left pleural effusion and edema. Electronically Signed   By: Kerby Moors M.D.   On: 03/07/2018 10:52   Ct Chest Wo Contrast  Result Date: 04/03/2018 CLINICAL DATA:  Shortness of breath for 2 days. EXAM: CT CHEST WITHOUT CONTRAST TECHNIQUE: Multidetector CT  imaging of the chest was performed following the standard protocol without IV contrast. COMPARISON:  CT chest 02/28/2016. PA and lateral chest 02/28/2016 and earlier today. FINDINGS: Cardiovascular: There is calcific aortic and coronary atherosclerosis. Heart size is normal. No pericardial effusion. Mediastinum/Nodes: No enlarged mediastinal or axillary lymph nodes. Thyroid gland, trachea, and esophagus demonstrate no significant findings. Small hiatal hernia noted. Lungs/Pleura: The patient has a small left pleural effusion. There is dense airspace disease in the anterior left upper lobe. Mild interlobular septal thickening is identified bilaterally. There is some dependent atelectatic change. Mild apical scar is noted. Ground-glass attenuating nodule in the right upper lobe seen on the prior chest CT is not visualized on this exam. Upper Abdomen: No acute abnormality. Musculoskeletal: No lytic or sclerotic lesion. Degenerative disc disease T12-L1 noted. IMPRESSION: Dense left upper lobe airspace disease is presumably pneumonia. Recommend plain film follow-up after appropriate therapy to exclude underlying neoplasm. Small left pleural effusion noted. Mild interlobular septal thickening suggestive of interstitial edema. Calcific coronary artery disease. Aortic Atherosclerosis (ICD10-I70.0) and Emphysema (ICD10-J43.9). Electronically Signed   By: Inge Rise M.D.   On: 04/02/2018 15:18        Scheduled Meds: . aspirin EC  81 mg Oral Daily  . fluticasone furoate-vilanterol  1 puff Inhalation Daily  . furosemide  40 mg Intravenous BID  . multivitamin  1 tablet Oral BID  . sodium chloride flush  3 mL Intravenous Q12H   Continuous Infusions: . sodium chloride    . azithromycin 500 mg (03/19/2018 1832)  . cefTRIAXone (ROCEPHIN)  IV 1 g (03/07/2018 1755)     LOS: 1 day    Time spent: 35 minutes.     Hosie Poisson, MD [facility] Pager [phone] If 7PM-7AM, please contact  night-coverage [email or web address] Password [facility] 03/09/2018, 11:29 AM

## 2018-03-09 NOTE — Progress Notes (Signed)
Progress Note  Patient Name: Kelly Benjamin Date of Encounter: 03/09/2018  Primary Cardiologist: Dr. Gwenlyn Found  Subjective   Still very short of breath, both at rest and with walking to bathroom. Somewhat improved when she repositioned in the bed, but still much worse than her baseline.   Inpatient Medications    Scheduled Meds: . aspirin EC  81 mg Oral Daily  . fluticasone furoate-vilanterol  1 puff Inhalation Daily  . furosemide  40 mg Intravenous BID  . ipratropium-albuterol  3 mL Nebulization Q6H  . multivitamin  1 tablet Oral BID  . predniSONE  40 mg Oral QAC breakfast  . sodium chloride flush  3 mL Intravenous Q12H   Continuous Infusions: . sodium chloride    . azithromycin 500 mg (03/16/2018 1832)  . cefTRIAXone (ROCEPHIN)  IV 1 g (03/20/2018 1755)   PRN Meds: sodium chloride, acetaminophen, sodium chloride flush   Vital Signs    Vitals:   03/09/18 0601 03/09/18 0747 03/09/18 0835 03/09/18 1137  BP: 116/80 115/75  112/67  Pulse: 88 89  90  Resp: 18 18    Temp: 98.3 F (36.8 C) 98.5 F (36.9 C)    TempSrc: Oral Oral    SpO2: 93% 95% 92% 95%  Weight: 68 kg     Height:        Intake/Output Summary (Last 24 hours) at 03/09/2018 1155 Last data filed at 03/09/2018 1144 Gross per 24 hour  Intake 1259 ml  Output 300 ml  Net 959 ml   Filed Weights   03/15/2018 0954 03/27/2018 1703 03/09/18 0601  Weight: 63.5 kg 67.1 kg 68 kg    Telemetry    Sinus rhythm - Personally Reviewed  ECG    No new since admission - Personally Reviewed  Physical Exam   GEN: Frail appearing, tachypneic Neck: JVD at low neck at 45 degrees Cardiac: RRR, no murmurs, rubs, or gallops.  Respiratory: coarse at left lower base, diminished in left upper fields. Faint crackles at right lower base. GI: Soft, nontender, non-distended  MS: Bilateral 1+ edema, improved from yesterday; No deformity. Neuro:  Nonfocal  Psych: Normal affect   Labs    Chemistry Recent Labs  Lab 03/05/2018 0957  03/09/18 0341  NA 137 135  K 3.6 3.8  CL 105 104  CO2 22 21*  GLUCOSE 127* 144*  BUN 17 20  CREATININE 0.95 1.16*  CALCIUM 9.2 8.9  GFRNONAA 58* 46*  GFRAA >60 53*  ANIONGAP 10 10     Hematology Recent Labs  Lab 03/16/2018 1243 03/09/18 0341  WBC 10.1 14.4*  RBC 2.45* 3.29*  HGB 5.6* 8.3*  HCT 20.0* 27.2*  MCV 81.6 82.7  MCH 22.9* 25.2*  MCHC 28.0* 30.5  RDW 18.2* 17.7*  PLT 335 359    Cardiac Enzymes Recent Labs  Lab 03/22/2018 1641 03/09/18 0341 03/09/18 0945  TROPONINI 0.73* 1.08* 1.35*    Recent Labs  Lab 03/29/2018 1008  TROPIPOC 0.36*     BNP Recent Labs  Lab 03/30/2018 0957  BNP 3,356.1*     DDimer No results for input(s): DDIMER in the last 168 hours.   Radiology    Dg Chest 2 View  Result Date: 03/07/2018 CLINICAL DATA:  Chest pressure and shortness of breath on exertion. EXAM: CHEST - 2 VIEW COMPARISON:  03/03/2016 FINDINGS: The heart size appears within normal limits. Aortic atherosclerosis. Masslike consolidation within the left upper lobe is identified. This is new when compared with the previous exam. Mild  diffuse edema and small left pleural effusion. IMPRESSION: 1. Masslike consolidation within the left upper lobe is identified. In the acute setting is favored to represent pneumonia. In the absence of signs or symptoms of infection consider CT of the chest with contrast material to assess for underlying malignancy in this patient who has a history of smoking and may be at increased risk for lung cancer. 2. Small left pleural effusion and edema. Electronically Signed   By: Kerby Moors M.D.   On: 03/27/2018 10:52   Ct Chest Wo Contrast  Result Date: 03/17/2018 CLINICAL DATA:  Shortness of breath for 2 days. EXAM: CT CHEST WITHOUT CONTRAST TECHNIQUE: Multidetector CT imaging of the chest was performed following the standard protocol without IV contrast. COMPARISON:  CT chest 02/28/2016. PA and lateral chest 02/28/2016 and earlier today. FINDINGS:  Cardiovascular: There is calcific aortic and coronary atherosclerosis. Heart size is normal. No pericardial effusion. Mediastinum/Nodes: No enlarged mediastinal or axillary lymph nodes. Thyroid gland, trachea, and esophagus demonstrate no significant findings. Small hiatal hernia noted. Lungs/Pleura: The patient has a small left pleural effusion. There is dense airspace disease in the anterior left upper lobe. Mild interlobular septal thickening is identified bilaterally. There is some dependent atelectatic change. Mild apical scar is noted. Ground-glass attenuating nodule in the right upper lobe seen on the prior chest CT is not visualized on this exam. Upper Abdomen: No acute abnormality. Musculoskeletal: No lytic or sclerotic lesion. Degenerative disc disease T12-L1 noted. IMPRESSION: Dense left upper lobe airspace disease is presumably pneumonia. Recommend plain film follow-up after appropriate therapy to exclude underlying neoplasm. Small left pleural effusion noted. Mild interlobular septal thickening suggestive of interstitial edema. Calcific coronary artery disease. Aortic Atherosclerosis (ICD10-I70.0) and Emphysema (ICD10-J43.9). Electronically Signed   By: Inge Rise M.D.   On: 03/27/2018 15:18    Cardiac Studies   Echo pending Prior workup reviewed personally  Patient Profile     78 y.o. female with a hx of CAD with history of NSTEMI, ischemic cardiomyopathy with most recent EF 40-45% who is being followed for shortness of breath and elevated troponin.  Assessment & Plan    Shortness of breath: likely multifactorial. Being treated for pneumonia and COPD exacerbation by primary team. Exam and labs also consistent with acute on chronic systolic heart failure -repeat echo pending -appears to be net positive, though not all output captured per patient. Weight up slightly, from 67.1 to 68 kg today. Receiving IV lasix while I was in the room. If poor urine output on this dose, would  continue to escalate dose. Aim for >1L negative today. -monitor electrolytes.  -continue metoprolol given elevated troponin, she is warm on exam -would hold entresto given borderline blood pressures  Acute anemia of unknown etiology: Augmented with 2 units of PRBC yesterday. Unclear source. See below re: concern for cath given unclear source of anemia  Elevated troponin: Continues to rise, peak thus far is 1.35. Her symptoms are concerning for ACS, as she has never had chest pain but rather shortness of breath. Last cath showed known 90% RCA lesions and 85% distal LAD lesion. RCA is recanalized CTO, has left to right collaterals as well. With anemia of unknown origin would be concerned for worsening with anticoagulation. Cannot cath until etiology of anemia is determined and corrected. -trend troponins -primary team continued aspirin, has not worsened anemia but monitor closely -hold plavix, no heparin while working up anemia -continue atorvastatin, zetia -continue metoprolol -hold imdur given borderline blood pressures  For  questions or updates, please contact [place] Please consult [email or web address] for contact info under     Signed, Buford Dresser, MD  03/09/2018, 11:55 AM

## 2018-03-09 NOTE — Progress Notes (Signed)
Patient had an ABG but RT only collected enough blood to run on ISTAT and since patient is not in ICU it will not crossover. RN is aware. Results are as follows: pH 7.45, PC02 25.3, HC03 17.8, and P02 85.

## 2018-03-09 NOTE — Progress Notes (Signed)
Patient started having SOB again.  On assessment- patient very diaphoretic, cool to touch.    BP has dropped, cannot get an oral temp., O2 100 at 3L, HR 83. Page MD.  Will hold Lasix for now- will reassess in an hour per MD  Rapid response nurse aware of episode.  Talked to MD MD- going to transfer to step-down to monitor for sepsis.

## 2018-03-09 NOTE — Plan of Care (Signed)
Patient became increasingly SOB with exertion. She will given an IS to assist with prior breathing as well as educated about her admitting diagnosis. Will continue to monitor to assist patient with comfort and improvement.

## 2018-03-09 NOTE — Progress Notes (Signed)
CRITICAL VALUE ALERT  Critical Value:  Lactic acid 8.2  Date & Time Notied:  09 Mar 2018 0850  Provider Notified:X blount NP  Orders Received/Actions taken: Hold lasix and recheck lactic acid at midnight

## 2018-03-09 NOTE — Consult Note (Signed)
Reason for Consult: Symptomatic anemia Referring Physician: Hospital team  Kelly Benjamin is an 78 y.o. female.  HPI: Patient without any GI complaints and no signs of obvious bleeding and her hospital computer chart was reviewed and no previous GI work-up and no obvious colon cancer or other specific GI problems like ulcers running in the family but she has been on aspirin and Plavix at home but denies any extra aspirin or nonsteroidals and for 5 days has had increased shortness of breath and dyspnea on exertion and unfortunately despite her transfusion she is not feeling any better and she has no other complaints and was eating fine at home but right now does not feel like eating  Past Medical History:  Diagnosis Date  . Breast cancer ([place]) 09/2017   left  . CAD in native artery 02/28/2016   2V CAD -medical rx  . COPD (chronic obstructive pulmonary disease) ([place])   . Dyslipidemia, goal LDL below 70   . Hypertension   . Ischemic cardiomyopathy    EF improved to 40-45% on Entresto  . Personal history of radiation therapy     Past Surgical History:  Procedure Laterality Date  . APPENDECTOMY    . BREAST LUMPECTOMY  09/2017  . BREAST SURGERY  2000  . CARDIAC CATHETERIZATION N/A 03/02/2016   Procedure: Left Heart Cath and Coronary Angiography;  Surgeon: Belva Crome, MD;  Location: [facility];  Service: Cardiovascular;  Laterality: N/A;  . CATARACT EXTRACTION    . KNEE SURGERY      Family History  Problem Relation Age of Onset  . Heart attack Father     Social History:  reports that she has quit smoking. She has quit using smokeless tobacco. She reports current alcohol use. She reports that she does not use drugs.  Allergies:  Allergies  Allergen Reactions  . Sulfa Antibiotics Swelling    Medications: I have reviewed the patient's current medications.  Results for orders placed or performed during the hospital encounter of 03/07/2018 (from the past 48 hour(s))   Basic metabolic panel     Status: Abnormal   Collection Time: 03/11/2018  9:57 AM  Result Value Ref Range   Sodium 137 135 - 145 mmol/L   Potassium 3.6 3.5 - 5.1 mmol/L   Chloride 105 98 - 111 mmol/L   CO2 22 22 - 32 mmol/L   Glucose, Bld 127 (H) 70 - 99 mg/dL   BUN 17 8 - 23 mg/dL   Creatinine, Ser 0.95 0.44 - 1.00 mg/dL   Calcium 9.2 8.9 - 10.3 mg/dL   GFR calc non Af Amer 58 (L) >60 mL/min   GFR calc Af Amer >60 >60 mL/min   Anion gap 10 5 - 15    Comment: Performed at [facility], [place] [address] [place], [place] [address]  Brain natriuretic peptide     Status: Abnormal   Collection Time: 03/27/2018  9:57 AM  Result Value Ref Range   B Natriuretic Peptide 3,356.1 (H) 0.0 - 100.0 pg/mL    Comment: Performed at [facility] [address], [place], [place] [address]  I-stat troponin, ED     Status: Abnormal   Collection Time: 03/09/2018 10:08 AM  Result Value Ref Range   Troponin i, poc 0.36 (HH) 0.00 - 0.08 ng/mL   Comment NOTIFIED PHYSICIAN    Comment 3            Comment: Due to the release kinetics  of cTnI, a negative result within the first hours of the onset of symptoms does not rule out myocardial infarction with certainty. If myocardial infarction is still suspected, repeat the test at appropriate intervals.   I-Stat CG4 Lactic Acid, ED     Status: None   Collection Time: 04/04/2018 11:19 AM  Result Value Ref Range   Lactic Acid, Venous 1.46 0.5 - 1.9 mmol/L  CBC     Status: Abnormal   Collection Time: 03/12/2018 12:43 PM  Result Value Ref Range   WBC 10.1 4.0 - 10.5 K/uL   RBC 2.45 (L) 3.87 - 5.11 MIL/uL   Hemoglobin 5.6 (LL) 12.0 - 15.0 g/dL    Comment: REPEATED TO VERIFY THIS CRITICAL RESULT HAS VERIFIED AND BEEN CALLED TO RN K RAND BY LESLIE BENFIELD ON 01 04 2020 AT 1316, AND HAS BEEN READ BACK.     HCT 20.0 (L) 36.0 - 46.0 %   MCV 81.6 80.0 - 100.0 fL   MCH 22.9 (L) 26.0 - 34.0 pg   MCHC 28.0 (L) 30.0 - 36.0 g/dL   RDW 18.2 (H) 11.5 - 15.5 %    Platelets 335 150 - 400 K/uL   nRBC 0.0 0.0 - 0.2 %    Comment: Performed at [facility] [address], [place], [place] [address]  Prepare RBC     Status: None   Collection Time: 03/19/2018  1:34 PM  Result Value Ref Range   Order Confirmation      ORDER PROCESSED BY BLOOD BANK Performed at [facility], [place] [address], [place], [place] [address]   Type and screen [place]     Status: None (Preliminary result)   Collection Time: 03/28/2018  1:34 PM  Result Value Ref Range   ABO/RH(D) A POS    Antibody Screen NEG    Sample Expiration 03/11/2018    Unit Number [id number]    Blood Component Type RED CELLS,LR    Unit division 00    Status of Unit ISSUED    Transfusion Status OK TO TRANSFUSE    Crossmatch Result Compatible    Unit Number [id number]    Blood Component Type RED CELLS,LR    Unit division 00    Status of Unit ISSUED    Transfusion Status OK TO TRANSFUSE    Crossmatch Result      Compatible Performed at [facility], [place] [address], [place], [place] [address]   ABO/Rh     Status: None   Collection Time: 03/29/2018  1:34 PM  Result Value Ref Range   ABO/RH(D)      A POS Performed at [facility] [address] [place], [place] [address]   I-Stat CG4 Lactic Acid, ED     Status: None   Collection Time: 04/01/2018  1:45 PM  Result Value Ref Range   Lactic Acid, Venous 1.11 0.5 - 1.9 mmol/L  Influenza panel by PCR (type A & B)     Status: None   Collection Time: 03/07/2018  2:29 PM  Result Value Ref Range   Influenza A By PCR NEGATIVE NEGATIVE   Influenza B By PCR NEGATIVE NEGATIVE    Comment: (NOTE) The Xpert Xpress Flu assay is intended as an aid in the diagnosis of  influenza and should not be used as a sole basis for treatment.  This  assay is FDA approved for nasopharyngeal swab specimens only. Nasal  washings and aspirates are unacceptable  for Xpert Xpress Flu testing. Performed at [facility], [place] [address], [place], [place] [address]   Culture, blood (routine x 2) Call MD if unable to obtain prior to antibiotics being given     Status: None (Preliminary result)   Collection Time: 03/11/2018  4:14 PM  Result Value Ref Range   Specimen Description BLOOD RIGHT ARM    Special Requests      BOTTLES DRAWN AEROBIC AND ANAEROBIC Blood Culture results may not be optimal due to an inadequate volume of blood received in culture bottles   Culture NO GROWTH < 24 HOURS    Report Status PENDING   Culture, blood (routine x 2) Call MD if unable to obtain prior to antibiotics being given     Status: None (Preliminary result)   Collection Time: 03/15/2018  4:21 PM  Result Value Ref Range   Specimen Description BLOOD RIGHT HAND    Special Requests      AEROBIC BOTTLE ONLY Blood Culture results may not be optimal due to an inadequate volume of blood received in culture bottles   Culture NO GROWTH < 24 HOURS    Report Status PENDING   Troponin I - Now Then Q6H     Status: Abnormal   Collection Time: 03/18/2018  4:41 PM  Result Value Ref Range   Troponin I 0.73 (HH) <0.03 ng/mL    Comment: CRITICAL RESULT CALLED TO, READ BACK BY AND VERIFIED WITHSela Hilding [facility] RN AT 4034 03/29/2018 BY Karie Chimera Performed at [facility], [address] [place], [place] [address]   Strep pneumoniae urinary antigen     Status: None   Collection Time: 03/13/2018  9:16 PM  Result Value Ref Range   Strep Pneumo Urinary Antigen NEGATIVE NEGATIVE    Comment:        Infection due to S. pneumoniae cannot be absolutely ruled out since the antigen present may be below the detection limit of the test. Performed at [facility], [place] [address], [place], [place] [address]   Troponin I - Now Then Q6H     Status: Abnormal   Collection Time: 03/09/18  3:41 AM  Result Value Ref Range   Troponin I 1.08 (HH) <0.03 ng/mL    Comment: CRITICAL VALUE NOTED.  VALUE IS CONSISTENT WITH PREVIOUSLY REPORTED AND CALLED  VALUE. Performed at [facility], [place] [address] [place],  [address]   Basic metabolic panel     Status: Abnormal   Collection Time: 03/09/18  3:41 AM  Result Value Ref Range   Sodium 135 135 - 145 mmol/L   Potassium 3.8 3.5 - 5.1 mmol/L   Chloride 104 98 - 111 mmol/L   CO2 21 (L) 22 - 32 mmol/L   Glucose, Bld 144 (H) 70 - 99 mg/dL   BUN 20 8 - 23 mg/dL   Creatinine, Ser 1.16 (H) 0.44 - 1.00 mg/dL   Calcium 8.9 8.9 - 10.3 mg/dL   GFR calc non Af Amer 46 (L) >60 mL/min   GFR calc Af Amer 53 (L) >60 mL/min   Anion gap 10 5 - 15    Comment: Performed at [facility] [address] [place],  [address]  CBC WITH DIFFERENTIAL     Status: Abnormal   Collection Time: 03/09/18  3:41 AM  Result Value Ref Range   WBC 14.4 (H) 4.0 - 10.5 K/uL   RBC 3.29 (L) 3.87 - 5.11 MIL/uL  Hemoglobin 8.3 (L) 12.0 - 15.0 g/dL    Comment: REPEATED TO VERIFY POST TRANSFUSION SPECIMEN    HCT 27.2 (L) 36.0 - 46.0 %   MCV 82.7 80.0 - 100.0 fL   MCH 25.2 (L) 26.0 - 34.0 pg   MCHC 30.5 30.0 - 36.0 g/dL   RDW 17.7 (H) 11.5 - 15.5 %   Platelets 359 150 - 400 K/uL   nRBC 0.0 0.0 - 0.2 %   Neutrophils Relative % 90 %   Neutro Abs 12.9 (H) 1.7 - 7.7 K/uL   Lymphocytes Relative 4 %   Lymphs Abs 0.6 (L) 0.7 - 4.0 K/uL   Monocytes Relative 5 %   Monocytes Absolute 0.8 0.1 - 1.0 K/uL   Eosinophils Relative 0 %   Eosinophils Absolute 0.1 0.0 - 0.5 K/uL   Basophils Relative 0 %   Basophils Absolute 0.0 0.0 - 0.1 K/uL   Immature Granulocytes 1 %   Abs Immature Granulocytes 0.10 (H) 0.00 - 0.07 K/uL    Comment: Performed at [facility], [address] [place], [place] [address]  Troponin I - Now Then Q6H     Status: Abnormal   Collection Time: 03/09/18  9:45 AM  Result Value Ref Range   Troponin I 1.35 (HH) <0.03 ng/mL    Comment: CRITICAL VALUE NOTED.  VALUE IS CONSISTENT WITH PREVIOUSLY REPORTED AND CALLED VALUE. Performed at [facility], [place] [address]  [place], [place] [address]   Reticulocytes     Status: Abnormal   Collection Time: 03/09/18 11:51 AM  Result Value Ref Range   Retic Ct Pct 1.7 0.4 - 3.1 %   RBC. 3.43 (L) 3.87 - 5.11 MIL/uL   Retic Count, Absolute 59.7 19.0 - 186.0 K/uL   Immature Retic Fract 42.3 (H) 2.3 - 15.9 %    Comment: Performed at [facility] [address], [place], [place] [address]    Dg Chest 2 View  Result Date: 04/03/2018 CLINICAL DATA:  Chest pressure and shortness of breath on exertion. EXAM: CHEST - 2 VIEW COMPARISON:  03/03/2016 FINDINGS: The heart size appears within normal limits. Aortic atherosclerosis. Masslike consolidation within the left upper lobe is identified. This is new when compared with the previous exam. Mild diffuse edema and small left pleural effusion. IMPRESSION: 1. Masslike consolidation within the left upper lobe is identified. In the acute setting is favored to represent pneumonia. In the absence of signs or symptoms of infection consider CT of the chest with contrast material to assess for underlying malignancy in this patient who has a history of smoking and may be at increased risk for lung cancer. 2. Small left pleural effusion and edema. Electronically Signed   By: Kerby Moors M.D.   On: 03/07/2018 10:52   Ct Chest Wo Contrast  Result Date: 03/28/2018 CLINICAL DATA:  Shortness of breath for 2 days. EXAM: CT CHEST WITHOUT CONTRAST TECHNIQUE: Multidetector CT imaging of the chest was performed following the standard protocol without IV contrast. COMPARISON:  CT chest 02/28/2016. PA and lateral chest 02/28/2016 and earlier today. FINDINGS: Cardiovascular: There is calcific aortic and coronary atherosclerosis. Heart size is normal. No pericardial effusion. Mediastinum/Nodes: No enlarged mediastinal or axillary lymph nodes. Thyroid gland, trachea, and esophagus demonstrate no significant findings. Small hiatal hernia noted. Lungs/Pleura: The patient has a small left pleural effusion.  There is dense airspace disease in the anterior left upper lobe. Mild interlobular septal thickening is identified bilaterally. There is some  dependent atelectatic change. Mild apical scar is noted. Ground-glass attenuating nodule in the right upper lobe seen on the prior chest CT is not visualized on this exam. Upper Abdomen: No acute abnormality. Musculoskeletal: No lytic or sclerotic lesion. Degenerative disc disease T12-L1 noted. IMPRESSION: Dense left upper lobe airspace disease is presumably pneumonia. Recommend plain film follow-up after appropriate therapy to exclude underlying neoplasm. Small left pleural effusion noted. Mild interlobular septal thickening suggestive of interstitial edema. Calcific coronary artery disease. Aortic Atherosclerosis (ICD10-I70.0) and Emphysema (ICD10-J43.9). Electronically Signed   By: Inge Rise M.D.   On: 03/09/2018 15:18    ROS negative except above Blood pressure 119/72, pulse 92, temperature 98.6 F (37 C), temperature source Oral, resp. rate 20, height 5\' 10"  (1.778 m), weight 68 kg, SpO2 93 %. Physical Exam vital signs stable afebrile some increased shortness of breath and increased work of breathing decreased breath sounds bilaterally decreased heart sounds abdomen is soft nontender hemoglobin increased nicely with transfusion BUN and creatinine okay increased troponin and B natruretic peptide chest x-ray and CT of the chest probable left upper lobe pneumonia upper abdominal okay on CT  Assessment/Plan: Symptomatic anemia and patient on aspirin and Plavix Plan: Initially I planned a endoscopy for tomorrow but based on her shortness of breath I think we should hold off until she is breathing better and please call us if signs of active bleeding otherwise my partner Dr. Cristina Gong will assess tomorrow and decide the timing of GI work-up  Kolby Myung E 03/09/2018, 12:54 PM

## 2018-03-09 NOTE — Progress Notes (Signed)
RT called me- reported patient having more difficulty breathing now than she did this morning.    On assessment:   pt RR elevated 26, vitals taken. O2 sat 94% on 3 L.   Pt request O2 turned up to 4L .   Sweating profusely,  Called rapid to check on patient.  Also called MD  MD came by to check on patient. Ordered another dose of 80 mg Lasix, EKG and lab work to check  Rapid response nurse came to check-  Will continue to monitor   Assisted patient to [facility] SOB worsen with exertion- place patient on purewick

## 2018-03-09 NOTE — Progress Notes (Signed)
rn called for pt with SOB that worsened with exertion. Dr. Karleen Hampshire paged prior to arrival, 40 mg Lasix IVP given around noon. Note suggest with no improvement to give an additional dose of Lasix.  New orders for 80 mg Lasix IVP. purewick placed.  1850 follow up with RN via telephone about ABG results. Pt refusing purewick, getting up to [facility] and gets extremely SOB when doing so. Dr. Karleen Hampshire paged, no new orders at this time.

## 2018-03-10 ENCOUNTER — Encounter (HOSPITAL_COMMUNITY): Admission: EM | Disposition: E | Payer: Self-pay | Source: Home / Self Care | Attending: Internal Medicine

## 2018-03-10 ENCOUNTER — Inpatient Hospital Stay (HOSPITAL_COMMUNITY): Payer: Medicare PPO

## 2018-03-10 DIAGNOSIS — I340 Nonrheumatic mitral (valve) insufficiency: Secondary | ICD-10-CM

## 2018-03-10 LAB — POCT I-STAT 3, ART BLOOD GAS (G3+)
Acid-base deficit: 5 mmol/L — ABNORMAL HIGH (ref 0.0–2.0)
BICARBONATE: 17.8 mmol/L — AB (ref 20.0–28.0)
O2 Saturation: 97 %
Patient temperature: 98.6
TCO2: 19 mmol/L — ABNORMAL LOW (ref 22–32)
pCO2 arterial: 25.3 mmHg — ABNORMAL LOW (ref 32.0–48.0)
pH, Arterial: 7.455 — ABNORMAL HIGH (ref 7.350–7.450)
pO2, Arterial: 85 mmHg (ref 83.0–108.0)

## 2018-03-10 LAB — CBC
HCT: 26.1 % — ABNORMAL LOW (ref 36.0–46.0)
HCT: 27.4 % — ABNORMAL LOW (ref 36.0–46.0)
HCT: 27.8 % — ABNORMAL LOW (ref 36.0–46.0)
HEMOGLOBIN: 8.5 g/dL — AB (ref 12.0–15.0)
HEMOGLOBIN: 8 g/dL — AB (ref 12.0–15.0)
Hemoglobin: 8.5 g/dL — ABNORMAL LOW (ref 12.0–15.0)
MCH: 24.9 pg — ABNORMAL LOW (ref 26.0–34.0)
MCH: 25.1 pg — ABNORMAL LOW (ref 26.0–34.0)
MCH: 25.3 pg — ABNORMAL LOW (ref 26.0–34.0)
MCHC: 30.6 g/dL (ref 30.0–36.0)
MCHC: 30.7 g/dL (ref 30.0–36.0)
MCHC: 31 g/dL (ref 30.0–36.0)
MCV: 81.5 fL (ref 80.0–100.0)
MCV: 81.5 fL (ref 80.0–100.0)
MCV: 81.8 fL (ref 80.0–100.0)
Platelets: 297 10*3/uL (ref 150–400)
Platelets: 308 10*3/uL (ref 150–400)
Platelets: 320 10*3/uL (ref 150–400)
RBC: 3.19 MIL/uL — ABNORMAL LOW (ref 3.87–5.11)
RBC: 3.36 MIL/uL — ABNORMAL LOW (ref 3.87–5.11)
RBC: 3.41 MIL/uL — ABNORMAL LOW (ref 3.87–5.11)
RDW: 17.8 % — ABNORMAL HIGH (ref 11.5–15.5)
RDW: 17.9 % — ABNORMAL HIGH (ref 11.5–15.5)
RDW: 18.5 % — ABNORMAL HIGH (ref 11.5–15.5)
WBC: 14 10*3/uL — ABNORMAL HIGH (ref 4.0–10.5)
WBC: 16.3 10*3/uL — ABNORMAL HIGH (ref 4.0–10.5)
WBC: 19.9 10*3/uL — ABNORMAL HIGH (ref 4.0–10.5)
nRBC: 0.1 % (ref 0.0–0.2)
nRBC: 0.1 % (ref 0.0–0.2)
nRBC: 0.1 % (ref 0.0–0.2)

## 2018-03-10 LAB — BASIC METABOLIC PANEL
Anion gap: 15 (ref 5–15)
BUN: 30 mg/dL — ABNORMAL HIGH (ref 8–23)
CO2: 18 mmol/L — AB (ref 22–32)
Calcium: 9 mg/dL (ref 8.9–10.3)
Chloride: 101 mmol/L (ref 98–111)
Creatinine, Ser: 1.45 mg/dL — ABNORMAL HIGH (ref 0.44–1.00)
GFR calc Af Amer: 40 mL/min — ABNORMAL LOW (ref >60)
GFR calc non Af Amer: 35 mL/min — ABNORMAL LOW (ref >60)
Glucose, Bld: 181 mg/dL — ABNORMAL HIGH (ref 70–99)
Potassium: 3.8 mmol/L (ref 3.5–5.1)
Sodium: 134 mmol/L — ABNORMAL LOW (ref 135–145)

## 2018-03-10 LAB — COMPREHENSIVE METABOLIC PANEL
ALT: 172 U/L — ABNORMAL HIGH (ref 0–44)
AST: 214 U/L — ABNORMAL HIGH (ref 15–41)
Albumin: 2.4 g/dL — ABNORMAL LOW (ref 3.5–5.0)
Alkaline Phosphatase: 79 U/L (ref 38–126)
Anion gap: 15 (ref 5–15)
BUN: 32 mg/dL — ABNORMAL HIGH (ref 8–23)
CO2: 15 mmol/L — ABNORMAL LOW (ref 22–32)
Calcium: 9.2 mg/dL (ref 8.9–10.3)
Chloride: 105 mmol/L (ref 98–111)
Creatinine, Ser: 1.59 mg/dL — ABNORMAL HIGH (ref 0.44–1.00)
GFR calc Af Amer: 36 mL/min — ABNORMAL LOW (ref >60)
GFR calc non Af Amer: 31 mL/min — ABNORMAL LOW (ref >60)
Glucose, Bld: 140 mg/dL — ABNORMAL HIGH (ref 70–99)
Potassium: 3.9 mmol/L (ref 3.5–5.1)
Sodium: 135 mmol/L (ref 135–145)
Total Bilirubin: 1.1 mg/dL (ref 0.3–1.2)
Total Protein: 6.2 g/dL — ABNORMAL LOW (ref 6.5–8.1)

## 2018-03-10 LAB — LACTIC ACID, PLASMA
Lactic Acid, Venous: 2.2 mmol/L (ref 0.5–1.9)
Lactic Acid, Venous: 2.7 mmol/L (ref 0.5–1.9)
Lactic Acid, Venous: 3.3 mmol/L (ref 0.5–1.9)
Lactic Acid, Venous: 5.6 mmol/L (ref 0.5–1.9)

## 2018-03-10 LAB — ECHOCARDIOGRAM COMPLETE
Height: 70 in
WEIGHTICAEL: 2444.46 [oz_av]

## 2018-03-10 SURGERY — ESOPHAGOGASTRODUODENOSCOPY (EGD) WITH PROPOFOL
Anesthesia: Monitor Anesthesia Care

## 2018-03-10 MED ORDER — ONDANSETRON HCL 4 MG/2ML IJ SOLN
4.0000 mg | Freq: Four times a day (QID) | INTRAMUSCULAR | Status: DC | PRN
  Administered 2018-03-11 – 2018-03-17 (×2): 4 mg via INTRAVENOUS
  Filled 2018-03-10 (×2): qty 2

## 2018-03-10 MED ORDER — FUROSEMIDE 10 MG/ML IJ SOLN
60.0000 mg | Freq: Two times a day (BID) | INTRAMUSCULAR | Status: DC
  Administered 2018-03-10: 60 mg via INTRAVENOUS
  Filled 2018-03-10: qty 6

## 2018-03-10 MED ORDER — PERFLUTREN LIPID MICROSPHERE
1.0000 mL | INTRAVENOUS | Status: AC | PRN
  Administered 2018-03-10: 2 mL via INTRAVENOUS
  Filled 2018-03-10: qty 10

## 2018-03-10 MED ORDER — VANCOMYCIN HCL 10 G IV SOLR
1500.0000 mg | Freq: Once | INTRAVENOUS | Status: AC
  Administered 2018-03-10: 1500 mg via INTRAVENOUS
  Filled 2018-03-10: qty 1500

## 2018-03-10 MED ORDER — SODIUM CHLORIDE 0.9 % IV BOLUS
500.0000 mL | Freq: Once | INTRAVENOUS | Status: AC
  Administered 2018-03-10: 500 mL via INTRAVENOUS

## 2018-03-10 MED ORDER — VANCOMYCIN HCL IN DEXTROSE 1-5 GM/200ML-% IV SOLN
1000.0000 mg | INTRAVENOUS | Status: DC
  Administered 2018-03-11 – 2018-03-13 (×3): 1000 mg via INTRAVENOUS
  Filled 2018-03-10 (×4): qty 200

## 2018-03-10 MED ORDER — SODIUM CHLORIDE 0.9 % IV SOLN
2.0000 g | INTRAVENOUS | Status: DC
  Administered 2018-03-11 – 2018-03-13 (×3): 2 g via INTRAVENOUS
  Filled 2018-03-10 (×5): qty 2

## 2018-03-10 MED ORDER — SODIUM CHLORIDE 0.9 % IV BOLUS: 250.0000 mL | Freq: Once | INTRAVENOUS | Status: DC

## 2018-03-10 MED ORDER — LORAZEPAM 1 MG PO TABS: 1.0000 mg | ORAL_TABLET | Freq: Three times a day (TID) | ORAL | Status: DC | PRN

## 2018-03-10 MED ORDER — PANTOPRAZOLE SODIUM 40 MG PO TBEC
40.0000 mg | DELAYED_RELEASE_TABLET | Freq: Every day | ORAL | Status: DC
  Administered 2018-03-11 – 2018-03-15 (×5): 40 mg via ORAL
  Filled 2018-03-10 (×5): qty 1

## 2018-03-10 MED ORDER — SODIUM CHLORIDE 0.9 % IV SOLN
2.0000 g | Freq: Once | INTRAVENOUS | Status: AC
  Administered 2018-03-10: 2 g via INTRAVENOUS
  Filled 2018-03-10: qty 2

## 2018-03-10 NOTE — Progress Notes (Signed)
PROGRESS NOTE    TY OSHIMA  WGN:[id number] DOB: [birth year] DOA: 04/03/2018 PCP: Maurice Small, MD    Brief Narrative: Kelly Benjamin is a 78 y.o. female with a hx of CAD with history of NSTEMI, ischemic cardiomyopathy with most recent EF 40-45%, hyperlipidemia, COPD, history of left breast cancer status post radiation and lumpectomy presents with severe dyspnea on exertion.  On arrival patient was found to have profound anemia with a hemoglobin of 5.6.  She was admitted for evaluation of symptomatic anemia and acute on chronic systolic heart failure and NSTEMI.   Assessment & Plan:   Principal Problem:   PNA (pneumonia) Active Problems:   COPD (chronic obstructive pulmonary disease) (HCC)   SOB (shortness of breath)   Ischemic cardiomyopathy   Normocytic anemia   NSTEMI (non-ST elevated myocardial infarction) ([place])   Acute respiratory failure with hypoxia probably secondary to a combination of pneumonia, COPD and acute systolic heart failure  Patient was found to have SEPSIS from  left upper lobe pneumonia/ community-acquired pneumonia. She was started on IV Rocephin and IV Zithromax.  Overnight she was febrile to 100.4.  Recommend to continue with IV antibiotics for another 24 to 48 hours.  Blood cultures have been negative so far.  Nasal cannula oxygen to keep sats greater than 90%.  Sputum cultures ordered if able to get sputum.   Lactic acid level improving.    Mild COPD exacerbation Start the patient on duo nebs and had 40 mg of prednisone daily. Add PPI for GI prophylaxis. No wheezing heard today on exam.  But she continues to have coarse breath sounds, tachypnea.    Mild acute on chronic systolic heart failure BNP on admission was greater than 3000.  Patient was started on IV Lasix 40 mg twice daily and cardiology consulted. We need 1.2 lit/ day  fluid restriction  And increase lasix as per cardiology.  ECHOCardiogram is pending.  Continue with strict intake and  output and daily weights and watch renal parameters while on Lasix.   Acute on anemia of chronic disease Patient's baseline hemoglobin is around 11. She was admitted with a hemoglobin of 5.6, received 2 units of PRBC transfusion and repeat hemoglobin is stable around 8.  She denies any hematemesis, hematochezia, melena Stool for occult blood ordered and anemia panel shows normal iron, low to normal ferritin, normal B12 .  Patient denies getting any previous colonoscopy or EGD. Gi consulted for recommendations and possible EGD when her respiratory status improves.    Elevated troponins probably from demand ischemia from heart failure and is symptomatic anemia. Further recommendations as per cardiology   History of coronary artery disease and ischemic cardiomyopathy Patient at home is on aspirin 81 mg and Plavix 75 mg daily. Currently she is chest pain-free.  Plavix held on admission and currently  on aspirin 81 daily. Resume beta-blocker and statin.   Hyperlipidemia Resume Zetia and statin.     DVT prophylaxis: SCDs Code Status: Full code Family Communication: None at bedside Disposition Plan: Pending clinical improvement and further evaluation   Consultants:   Cardiology  Gastroenterology.   Procedures: Echocardiogram Antimicrobials: IV Rocephin and Zithromax since admission  Subjective: Earlier this am, she reports feeling better, has bouts of anxiety associated with sob. She denies any chest pain.  No nausea or vomiting.   Objective: Vitals:   03/25/2018 0739 03/27/2018 0845 03/13/2018 0900 03/13/2018 1134  BP:  101/65    Pulse: 78 79 80 78  Resp: (!) 26 (!)  22  20  Temp:  97.6 F (36.4 C)    TempSrc:  Oral    SpO2: 99% 99% 99% 97%  Weight:      Height:        Intake/Output Summary (Last 24 hours) at 04/02/2018 1140 Last data filed at 03/27/2018 0730 Gross per 24 hour  Intake 1454.24 ml  Output 775 ml  Net 679.24 ml   Filed Weights   03/24/2018 1703 03/09/18  0601 03/28/2018 0300  Weight: 67.1 kg 68 kg 69.3 kg    Examination:  General exam: not in distress.  Respiratory system: Tachypnea, basilar Rales, no wheezing heard. Air entry fair.  Cardiovascular system: S1 & S2 heard, RRR. Gastrointestinal system: Abdomen is soft NT ND BS+ Central nervous system: Alert and oriented,  grossly nonfocal Extremities: Symmetric 5 x 5 power. Skin: No rashes, lesions or ulcers Psychiatry: Anxious looking    Data Reviewed: I have personally reviewed following labs and imaging studies  CBC: Recent Labs  Lab 03/07/2018 1243 03/09/18 0341 03/09/18 1557 03/09/18 2335 03/06/2018 0400  WBC 10.1 14.4*  --  14.0* 16.3*  NEUTROABS  --  12.9*  --   --   --   HGB 5.6* 8.3* 8.8* 8.5* 8.5*  HCT 20.0* 27.2* 29.0* 27.4* 27.8*  MCV 81.6 82.7  --  81.5 81.5  PLT 335 359  --  297 546   Basic Metabolic Panel: Recent Labs  Lab 03/22/2018 0957 03/09/18 0341 03/09/18 2335 03/30/2018 0400  NA 137 135 134* 135  K 3.6 3.8 3.8 3.9  CL 105 104 101 105  CO2 22 21* 18* 15*  GLUCOSE 127* 144* 181* 140*  BUN 17 20 30* 32*  CREATININE 0.95 1.16* 1.45* 1.59*  CALCIUM 9.2 8.9 9.0 9.2   GFR: Estimated Creatinine Clearance: 32.6 mL/min (A) (by C-G formula based on SCr of 1.59 mg/dL (H)). Liver Function Tests: Recent Labs  Lab 03/29/2018 0400  AST 214*  ALT 172*  ALKPHOS 79  BILITOT 1.1  PROT 6.2*  ALBUMIN 2.4*   No results for input(s): LIPASE, AMYLASE in the last 168 hours. No results for input(s): AMMONIA in the last 168 hours. Coagulation Profile: No results for input(s): INR, PROTIME in the last 168 hours. Cardiac Enzymes: Recent Labs  Lab 03/22/2018 1641 03/09/18 0341 03/09/18 0945 03/09/18 1557  TROPONINI 0.73* 1.08* 1.35* 1.30*   BNP (last 3 results) No results for input(s): PROBNP in the last 8760 hours. HbA1C: No results for input(s): HGBA1C in the last 72 hours. CBG: Recent Labs  Lab 03/09/18 1437  GLUCAP 158*   Lipid Profile: No results for  input(s): CHOL, HDL, LDLCALC, TRIG, CHOLHDL, LDLDIRECT in the last 72 hours. Thyroid Function Tests: No results for input(s): TSH, T4TOTAL, FREET4, T3FREE, THYROIDAB in the last 72 hours. Anemia Panel: Recent Labs    03/09/18 1151  VITAMINB12 656  FOLATE 6.9  FERRITIN 54  TIBC 305  IRON 44  RETICCTPCT 1.7   Sepsis Labs: Recent Labs  Lab 03/14/2018 1345 03/09/18 1931 03/09/18 2335 03/30/2018 0400  LATICACIDVEN 1.11 8.2* 3.3* 2.2*    Recent Results (from the past 240 hour(s))  Culture, blood (routine x 2) Call MD if unable to obtain prior to antibiotics being given     Status: None (Preliminary result)   Collection Time: 03/21/2018  4:14 PM  Result Value Ref Range Status   Specimen Description BLOOD RIGHT ARM  Final   Special Requests   Final    BOTTLES DRAWN AEROBIC AND  ANAEROBIC Blood Culture results may not be optimal due to an inadequate volume of blood received in culture bottles   Culture NO GROWTH < 24 HOURS  Final   Report Status PENDING  Incomplete  Culture, blood (routine x 2) Call MD if unable to obtain prior to antibiotics being given     Status: None (Preliminary result)   Collection Time: 03/17/2018  4:21 PM  Result Value Ref Range Status   Specimen Description BLOOD RIGHT HAND  Final   Special Requests   Final    AEROBIC BOTTLE ONLY Blood Culture results may not be optimal due to an inadequate volume of blood received in culture bottles   Culture NO GROWTH < 24 HOURS  Final   Report Status PENDING  Incomplete         Radiology Studies: Ct Chest Wo Contrast  Result Date: 03/07/2018 CLINICAL DATA:  Shortness of breath for 2 days. EXAM: CT CHEST WITHOUT CONTRAST TECHNIQUE: Multidetector CT imaging of the chest was performed following the standard protocol without IV contrast. COMPARISON:  CT chest 02/28/2016. PA and lateral chest 02/28/2016 and earlier today. FINDINGS: Cardiovascular: There is calcific aortic and coronary atherosclerosis. Heart size is normal. No  pericardial effusion. Mediastinum/Nodes: No enlarged mediastinal or axillary lymph nodes. Thyroid gland, trachea, and esophagus demonstrate no significant findings. Small hiatal hernia noted. Lungs/Pleura: The patient has a small left pleural effusion. There is dense airspace disease in the anterior left upper lobe. Mild interlobular septal thickening is identified bilaterally. There is some dependent atelectatic change. Mild apical scar is noted. Ground-glass attenuating nodule in the right upper lobe seen on the prior chest CT is not visualized on this exam. Upper Abdomen: No acute abnormality. Musculoskeletal: No lytic or sclerotic lesion. Degenerative disc disease T12-L1 noted. IMPRESSION: Dense left upper lobe airspace disease is presumably pneumonia. Recommend plain film follow-up after appropriate therapy to exclude underlying neoplasm. Small left pleural effusion noted. Mild interlobular septal thickening suggestive of interstitial edema. Calcific coronary artery disease. Aortic Atherosclerosis (ICD10-I70.0) and Emphysema (ICD10-J43.9). Electronically Signed   By: Inge Rise M.D.   On: 03/22/2018 15:18   Dg Chest Port 1 View  Result Date: 03/09/2018 CLINICAL DATA:  78 y/o  F; shortness of breath since yesterday. EXAM: PORTABLE CHEST 1 VIEW COMPARISON:  03/08/2017 chest radiograph and chest CT. FINDINGS: Stable cardiac silhouette given projection and technique. Aortic atherosclerosis with calcification. Left mid lung zone dense consolidation is stable. Increasing hazy opacification at the lung bases, probably layering of pleural effusion. No pneumothorax. No acute osseous abnormality is evident. IMPRESSION: 1. Stable left mid lung zone consolidation, likely pneumonia. Followup PA and lateral chest X-ray is recommended in 3-4 weeks following trial of antibiotic therapy to ensure resolution and exclude underlying malignancy. 2. Increasing hazy opacification of the lung bases, probably layering of  pleural effusions. Electronically Signed   By: Kristine Garbe M.D.   On: 03/09/2018 17:42        Scheduled Meds: . aspirin EC  81 mg Oral Daily  . atorvastatin  40 mg Oral QHS  . ezetimibe  10 mg Oral Daily  . fluticasone furoate-vilanterol  1 puff Inhalation Daily  . furosemide  40 mg Intravenous BID  . ipratropium-albuterol  3 mL Nebulization Q6H  . metoprolol succinate  25 mg Oral Daily  . multivitamin  1 tablet Oral BID  . predniSONE  40 mg Oral QAC breakfast  . sodium bicarbonate  650 mg Oral TID  . sodium chloride flush  3  mL Intravenous Q12H   Continuous Infusions: . sodium chloride    . azithromycin 250 mL/hr at 03/09/18 1758  . cefTRIAXone (ROCEPHIN)  IV Stopped (03/09/18 1731)     LOS: 2 days    Time spent: 32 minutes.     Hosie Poisson, MD [facility] Pager [phone] If 7PM-7AM, please contact night-coverage [email or web address] Password [email or web address] 04/03/2018, 11:40 AM

## 2018-03-10 NOTE — Progress Notes (Addendum)
Progress Note  Patient Name: Kelly Benjamin Date of Encounter: 03/27/2018  Primary Cardiologist: No primary care provider on file.   Subjective   Patient continues to report significant shortness of breath with very minimal activity. She also notes pleuritic chest pain with deep breaths. Patient states she feels like her symptoms have worsened since being here.  Inpatient Medications    Scheduled Meds: . aspirin EC  81 mg Oral Daily  . atorvastatin  40 mg Oral QHS  . ezetimibe  10 mg Oral Daily  . fluticasone furoate-vilanterol  1 puff Inhalation Daily  . furosemide  40 mg Intravenous BID  . ipratropium-albuterol  3 mL Nebulization Q6H  . metoprolol succinate  25 mg Oral Daily  . multivitamin  1 tablet Oral BID  . predniSONE  40 mg Oral QAC breakfast  . sodium bicarbonate  650 mg Oral TID  . sodium chloride flush  3 mL Intravenous Q12H   Continuous Infusions: . sodium chloride    . azithromycin 250 mL/hr at 03/09/18 1758  . cefTRIAXone (ROCEPHIN)  IV Stopped (03/09/18 1731)   PRN Meds: sodium chloride, acetaminophen, sodium chloride flush   Vital Signs    Vitals:   03/09/18 2335 03/18/2018 0300 03/27/2018 0739 04/03/2018 0845  BP: 105/70 106/61  101/65  Pulse: 78 79 78 79  Resp: 18 19 (!) 26 (!) 22  Temp: 97.6 F (36.4 C) 97.6 F (36.4 C)  97.6 F (36.4 C)  TempSrc: Oral Oral  Oral  SpO2: 100% 100% 99% 99%  Weight:  69.3 kg    Height:        Intake/Output Summary (Last 24 hours) at 03/08/2018 0858 Last data filed at 03/08/2018 0427 Gross per 24 hour  Intake 1214.24 ml  Output 775 ml  Net 439.24 ml   Filed Weights   03/19/2018 1703 03/09/18 0601 03/31/2018 0300  Weight: 67.1 kg 68 kg 69.3 kg    Telemetry    Sinus rhythm with heart rates in the 70's to 80's. - Personally Reviewed  ECG    No new ECG tracings today. - Personally Reviewed  Physical Exam   GEN: Elderly Caucasian female in no acute distress at rest but comes very tachypneic with minimal  activity.  Neck: Supple. Cardiac: RRR. No murmurs, rubs, or gallops.  Respiratory: Labored breathing and tachypnea noted with minimal activity. Course crackles in bilateral bases.  GI: Abdomen soft, non-distended, and non-tender to palpation. Bowel sounds present. MS: Mild pitting edema of bilateral lower extremities. Skin: Warm and dry. Neuro:  No focal deficits. Psych: Normal affect.  Labs    Chemistry Recent Labs  Lab 03/09/18 0341 03/09/18 2335 03/16/2018 0400  NA 135 134* 135  K 3.8 3.8 3.9  CL 104 101 105  CO2 21* 18* 15*  GLUCOSE 144* 181* 140*  BUN 20 30* 32*  CREATININE 1.16* 1.45* 1.59*  CALCIUM 8.9 9.0 9.2  PROT  --   --  6.2*  ALBUMIN  --   --  2.4*  AST  --   --  214*  ALT  --   --  172*  ALKPHOS  --   --  79  BILITOT  --   --  1.1  GFRNONAA 46* 35* 31*  GFRAA 53* 40* 36*  ANIONGAP 10 15 15      Hematology Recent Labs  Lab 03/09/18 0341 03/09/18 1151 03/09/18 1557 03/09/18 2335 03/30/2018 0400  WBC 14.4*  --   --  14.0* 16.3*  RBC 3.29*  3.43*  --  3.36* 3.41*  HGB 8.3*  --  8.8* 8.5* 8.5*  HCT 27.2*  --  29.0* 27.4* 27.8*  MCV 82.7  --   --  81.5 81.5  MCH 25.2*  --   --  25.3* 24.9*  MCHC 30.5  --   --  31.0 30.6  RDW 17.7*  --   --  17.8* 17.9*  PLT 359  --   --  297 320    Cardiac Enzymes Recent Labs  Lab 04/01/2018 1641 03/09/18 0341 03/09/18 0945 03/09/18 1557  TROPONINI 0.73* 1.08* 1.35* 1.30*    Recent Labs  Lab 03/28/2018 1008  TROPIPOC 0.36*     BNP Recent Labs  Lab 03/13/2018 0957  BNP 3,356.1*     DDimer No results for input(s): DDIMER in the last 168 hours.   Radiology    Dg Chest 2 View  Result Date: 03/27/2018 CLINICAL DATA:  Chest pressure and shortness of breath on exertion. EXAM: CHEST - 2 VIEW COMPARISON:  03/03/2016 FINDINGS: The heart size appears within normal limits. Aortic atherosclerosis. Masslike consolidation within the left upper lobe is identified. This is new when compared with the previous exam. Mild  diffuse edema and small left pleural effusion. IMPRESSION: 1. Masslike consolidation within the left upper lobe is identified. In the acute setting is favored to represent pneumonia. In the absence of signs or symptoms of infection consider CT of the chest with contrast material to assess for underlying malignancy in this patient who has a history of smoking and may be at increased risk for lung cancer. 2. Small left pleural effusion and edema. Electronically Signed   By: Kerby Moors M.D.   On: 04/02/2018 10:52   Ct Chest Wo Contrast  Result Date: 03/13/2018 CLINICAL DATA:  Shortness of breath for 2 days. EXAM: CT CHEST WITHOUT CONTRAST TECHNIQUE: Multidetector CT imaging of the chest was performed following the standard protocol without IV contrast. COMPARISON:  CT chest 02/28/2016. PA and lateral chest 02/28/2016 and earlier today. FINDINGS: Cardiovascular: There is calcific aortic and coronary atherosclerosis. Heart size is normal. No pericardial effusion. Mediastinum/Nodes: No enlarged mediastinal or axillary lymph nodes. Thyroid gland, trachea, and esophagus demonstrate no significant findings. Small hiatal hernia noted. Lungs/Pleura: The patient has a small left pleural effusion. There is dense airspace disease in the anterior left upper lobe. Mild interlobular septal thickening is identified bilaterally. There is some dependent atelectatic change. Mild apical scar is noted. Ground-glass attenuating nodule in the right upper lobe seen on the prior chest CT is not visualized on this exam. Upper Abdomen: No acute abnormality. Musculoskeletal: No lytic or sclerotic lesion. Degenerative disc disease T12-L1 noted. IMPRESSION: Dense left upper lobe airspace disease is presumably pneumonia. Recommend plain film follow-up after appropriate therapy to exclude underlying neoplasm. Small left pleural effusion noted. Mild interlobular septal thickening suggestive of interstitial edema. Calcific coronary artery  disease. Aortic Atherosclerosis (ICD10-I70.0) and Emphysema (ICD10-J43.9). Electronically Signed   By: Inge Rise M.D.   On: 03/30/2018 15:18   Dg Chest Port 1 View  Result Date: 03/09/2018 CLINICAL DATA:  78 y/o  F; shortness of breath since yesterday. EXAM: PORTABLE CHEST 1 VIEW COMPARISON:  03/08/2017 chest radiograph and chest CT. FINDINGS: Stable cardiac silhouette given projection and technique. Aortic atherosclerosis with calcification. Left mid lung zone dense consolidation is stable. Increasing hazy opacification at the lung bases, probably layering of pleural effusion. No pneumothorax. No acute osseous abnormality is evident. IMPRESSION: 1. Stable left mid lung zone  consolidation, likely pneumonia. Followup PA and lateral chest X-ray is recommended in 3-4 weeks following trial of antibiotic therapy to ensure resolution and exclude underlying malignancy. 2. Increasing hazy opacification of the lung bases, probably layering of pleural effusions. Electronically Signed   By: Kristine Garbe M.D.   On: 03/09/2018 17:42    Cardiac Studies   Echocardiogram 06/20/2016: Study Conclusions: - Left ventricle: The cavity size was normal. There was mild   concentric hypertrophy. Systolic function was mildly to   moderately reduced. The estimated ejection fraction was in the   range of 40% to 45%. Hypokinesis of the anterior and   anterolateral with akinesis of the inferolateral myocardium.   Doppler parameters are consistent with abnormal left ventricular   relaxation (grade 1 diastolic dysfunction). Doppler parameters   are consistent with indeterminate ventricular filling pressure. - Aortic valve: Transvalvular velocity was within the normal range.   There was no stenosis. There was no regurgitation. - Mitral valve: Transvalvular velocity was within the normal range.   There was no evidence for stenosis. There was mild regurgitation. - Right ventricle: Systolic function was  normal. - Atrial septum: No defect or patent foramen ovale was identified   by color flow Doppler. - Tricuspid valve: There was trivial regurgitation. - Pulmonary arteries: Systolic pressure was within the normal   range. PA peak pressure: 21 mm Hg (S). _______________  Left Heart Catheterization 03/02/2016:  Prox RCA to Mid RCA lesion, 90 %stenosed.  Dist LAD lesion, 85 %stenosed.    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 The right coronary is a very large territory and also has left right collaterals from the left coronary system. The likely scenario is the patient's inferolateral infarction occurred due to thrombotic occlusion and she subsequently had on pain is recanalization.  Widely patent circumflex.  Diffuse 85-90% apical LAD disease.  Elevated left ventricular end-diastolic pressure. Previously documented LVEF in the 30-35% range by noninvasive imaging.  Recommendations:  In absence of angina or ischemia on nuclear scintigraphy, medical therapy would appear to be the treatment of choice.  LV dysfunction is likely secondary to chronic remodeling following inferolateral infarction which occurred remotely.  Guideline mandated heart failure therapy. Discontinuation amlodipine will allow further up titration of heart failure agents.  Patient Profile   Kelly Benjamin is a 78 y.o. female with a history of of CAD, ischemic cardiomyopathy with EF of 40-45% on Echo in 06/2016, hyperlipidemia, COPD, and left breast cancer s/p radiation and lumpectomy who presented to the [facility] ED on 03/08/2017 for evaluation of worsening shortness of breath and edema. On arrival, she was found to have profound anemia with a hemoglobin of 5.6. Chest x-ray consistent with left upper lobe pneumonia. Troponin also elevated. Cardiology was consulted for acute on chronic systolic CHF and NSTEMI.  Assessment & Plan    Shortness of Breath -  Likely multifactorial. Currently being treated for pneumonia and COPD exacerbation. However, exam and labs also consistent with acute on chronic CHF. - BNP elevated at 3,356.1. - Repeat chest x-ray yesterday showed stable left mid lung zone consolidation with increasing hazy opacification of the lung bases (probably layering pleural effusions). - Echo pending. - Documented output of 775 mL in the past 24 hours even with extra dose of IV Lasix 80mg  yesterday. Appears to be net positive but per patient not all output has been captured.  - Patient continues to have significant shortness of breath with minimal activity.  -  Currently being diuresed with IV Lasix 40mg  twice daily. May need to increase but with worsening renal function and borderline BP, will defer to MD.  - Consider consulting Nephrology.  Elevated Troponin with Known CAD - Troponin peaked at 1.35 and is trending down. - Symptoms concerning for ACS with shortness of breath being an anginal equivalent. Last cardiac catheterization in 02/2016 showed 90% RCA lesion and 85% distal LAD lesion. RCA is recanalized CTO and has left to right collaterals. - With anemia of unknown origin, will hold off on any anticoagulation at this time. Cannot proceed with cardiac catheterization until etiology of anemia is determined and corrected. - OK to continue Aspirin at this time given hemoglobin is stable but will need to monitor closely.  - Continue Lipitor and Zetia. - Continue beta-blocker. - Will hold Imdur given borderline BP. Most recent BP soft but stable at 101/65.  - Patient reports pleuritic chest pain today.  Acute Anemia of Unknown Etiology - Hemoglobin 5.6 on admission (11.3 in 02/2016).  - Patient received 2 units of PRBC.  - Hemoglobin stable at 8.5 today. - GI is following patient. Initially planned for endoscopy today; however, they would like patient's shortness of breath to improve before proceeding with procedure.   Community  Acquired Pneumonia - WBC 16.3 today. Up from 14.0 yesterday. - Lactic acid 2.2 today.  - Continue antibiotics per primary team.    For questions or updates, please contact [place] Please consult [email or web address] for contact info under        Signed, Darreld Mclean, PA-C  03/07/2018, 8:58 AM    ---------------------------------------------------------------------------------------------   History and all data above reviewed.  Patient examined.  I agree with the findings as above.  Kelly Benjamin has waxing and waning respiratory symptoms. We discussed the need for evaluation of anemia prior to assessment of coronaries.  Constitutional: Visibly dyspneic. Cardiovascular: regular rhythm, normal rate, no murmurs. S1 and S2 normal. Radial pulses normal bilaterally. No jugular venous distention.  Respiratory: bibasilar crackles, increased work of breathing. GI : normal bowel sounds, soft and nontender. No distention.   MSK: extremities warm, well perfused. Mild bilateral pitting edema.  NEURO: grossly nonfocal exam, moves all extremities. PSYCH: alert and oriented x 3, normal mood and affect.   All available labs, radiology testing, previous records reviewed. Agree with documented assessment and plan of my colleague as stated above with the following additions or changes:  Principal Problem:   PNA (pneumonia) Active Problems:   COPD (chronic obstructive pulmonary disease) (HCC)   SOB (shortness of breath)   Ischemic cardiomyopathy   Normocytic anemia   NSTEMI (non-ST elevated myocardial infarction) ([place])   Plan: will increase diuresis to lasix 60 iv bid since she is net positive daily - she continues to drink water because she feels that is good for her. We discussed drinking to thirst only and volume restriction in the setting of attempts at diuresis. If creatinine increases secondary to diuresis we will deescalate. Aim for negative 1 L today, weight has been increasing.    Will await echocardiogram which is pending. Final opinion from GI will be helpful, and that workup is dependent on her respiratory status. We discussed this today with her friend present.   Length of Stay:  LOS: 2 days   Elouise Munroe, MD [facility] 1:27 PM  03/22/2018

## 2018-03-10 NOTE — Progress Notes (Signed)
Pt has increased difficulty breathing with diaphoresis. BP dropping. Rapid notified. EKG performed. Pt denies chest pain. Dr. Karleen Hampshire notified. Cardiology notified.

## 2018-03-10 NOTE — Progress Notes (Signed)
  Echocardiogram 2D Echocardiogram has been performed.  Kelly Benjamin 03/12/2018, 2:08 PM

## 2018-03-10 NOTE — Significant Event (Signed)
Rapid Response Event Note  Overview: Time Called: 2244 Arrival Time: 9753 Event Type: Respiratory  Initial Focused Assessment: Per RN patient admitted with SOB, PNA and CHF, effusion BP decreased this afternoon. Upon my arrival patient is SOB with increased WOB RR 30s Crackles through out She denies CP but states that it is hard to breath. She is diaphoretic BP 89/73  HR 69  RR 33  O2 sat 100% on [place] Per RN she has received 40 mg lasix this am and 60mg  lasix this PM with about 125cc UOP (foley)  Interventions: PCXR done Placed patient on Bipap 12/8 Fio2 35% Tolerating Bipap Confirmed BP with manual cuff ( 82/61) mean 70 Notified Dr Karleen Hampshire and Cardiology of patient change in status. Per Echo results this afternoon EF 15-20%  Dr Karleen Hampshire at bedside to assess patient  Plan of Care (if not transferred): Rn to call if assistance needed  Event Summary: Name of Physician Notified: Akula at 1740    at    Outcome: Stayed in room and stabalized  Event End Time: [place]  Raliegh Ip

## 2018-03-10 NOTE — Progress Notes (Signed)
Pt has only had out 125 via foley catheter since 1930 , slight increase in crackles paged X blount no new orders given will continue to monitor.

## 2018-03-10 NOTE — Consult Note (Signed)
NAME:  Kelly Benjamin, MRN:  [id number], DOB:  [birth year], LOS: 2 ADMISSION DATE:  03/22/2018, CONSULTATION DATE:  1/6 REFERRING MD:  Dr. Karleen Hampshire, CHIEF COMPLAINT:  Dyspnea, hypotension   Brief History   78 year old female admitted for CAP. Course became complicated by worsening dyspnea and hypotension requiring BiPAP and volume resuscitation, but EF if 15%.   History of present illness   78 year old female with PMH as below, which is significant for CAD, CHF, COPD, and HTN. She presented to [facility] ED 1/4 with complaints of dyspnea on exertion for several days accompanied by leg swelling, but she denied chest pain at that time. She was found to have hemoglobin of 5.6, positive troponin, and LUL opacification on CXR. She was admitted to the hospitalists for treatment of CAP and blood transfusions. Cardiology had been consulted and felt as though she was volume overloaded. She was treated with IV diuresis and underwhelming response. Her troponin was elevated and peaked at 1.35 and has trended down. No plans for cardiac intervention at this time. GI following with no plans for endoscopic workup until contidion optimized. Then 1/6 her course became complicated by worsening dyspnea and intermittent hypotension. Her BP somewhat responded to volume, and she was started on BiPAP, which she tolerated pretty well. Lactic acid rose to 5.6 and her antibiotics were broadened for fear of sepsis.   Past Medical History   has a past medical history of Breast cancer ([place]) (09/2017), CAD in native artery (02/28/2016), COPD (chronic obstructive pulmonary disease) ([place]), Dyslipidemia, goal LDL below 70, Hypertension, Ischemic cardiomyopathy, and Personal history of radiation therapy.  Significant Hospital Events   1/4 admit 1/6 hypotension, dyspnea, started on BiPAP.   Consults:  Cardiology Gastroenerololy Pulmonary 1/6  Procedures:    Significant Diagnostic Tests:  CT chest 1/4 > Dense left upper lobe  airspace disease is presumably pneumonia. Recommend plain film follow-up after appropriate therapy to exclude underlying neoplasm. Small left pleural effusion noted. Mild interlobular septal thickening suggestive of interstitial edema. Echo 1/6 > LVEF 15-20%, Diffuse hypokinesis. Grade 2 diastolic dysfunction. Mod MR, RV systolic function mod reduced. Mod TR. PA peak pressure at 41 mmHg.   Micro Data:  Blood 1/4 > Sputum 1/4 >  Antimicrobials:  Ceftriaxone 1/4 > 1/6 Azithromycin 1/4 > 1/6 Cefepime 1/6 > Vancomycin 1/6 >  Interim history/subjective:    Objective   Blood pressure 99/66, pulse (!) 122, temperature (!) 96.1 F (35.6 C), temperature source Axillary, resp. rate (!) 33, height 5\' 10"  (1.778 m), weight 69.3 kg, SpO2 100 %.    FiO2 (%):  [30 %-35 %] 30 %   Intake/Output Summary (Last 24 hours) at 03/15/2018 2149 Last data filed at 03/21/2018 2057 Gross per 24 hour  Intake 889.2 ml  Output 125 ml  Net 764.2 ml   Filed Weights   03/09/2018 1703 03/09/18 0601 03/09/2018 0300  Weight: 67.1 kg 68 kg 69.3 kg    Examination: General: Elderly appearing female breathing comfortably on BiPAP HENT: [place]/AT, PERRL, no appreciable JVD Lungs: Clear, bilateral breath sounds. RR increased to low 30s.  Cardiovascular: RRR, no MRG Abdomen: Soft, non-tender, non-distended Extremities: No acute deformity or ROM limitation. Trace - 1+ non-pitting BLE edema.  Neuro: Alert, oriented, non-focal GU: Foley.   Resolved Hospital Problem list     Assessment & Plan:   Acute hypoxemic respiratory failure, which is multifactorial secondary to community acquired pneumonia and CHF exacerbation.  - PRN BiPAP. Keep on overnight and  try to wean to HFNC in the morning. I suspect she is largely benefiting for EPAP/PEEP. Respiratory therapy aware. If she is unable to tolerate HFNC, will continue BiPAP, but ultimately may need intubation to facilitate further care.  - Titrate FiO2 to maintain SpO2 88-95%.     CAP - Continue empiric antibiotics, which were appropriately broadened earlier today as a result of her acute decline.  - Cultures pending  Acute exacerbation of HFrEF:  EF 15-20% on echo. This is a sharp decline from her last echo (40-45%). Cardiology is on board. Diuresis will be limited by hypotension. Notably her BP did respond to volume. If she does not improve she may need a transfer to ICU for pressors, perhaps milrinone. Have held AM dose of lasix, otherwise plan per cardiology.   COPD +/- acute exacerbation - continue scheduled bronchodilators.  - Favor short course of steroids if she will tolerate, ie: 5 days.   Hypotension improved s/p 2.5L IVF resuscitation today. Lactic acid increased to 5.6, but has since cleared to 2.7. Suspect lactic acidosis is multifactorial due to hypotension and work of breathing. MAP on my evaluation is 46mmHg.  - hold off on further IVF resuscitation - Trend lactic to ensure clears - May need ICU transfer for pressors should she decline again.   Global: I have informed her that she is looking better, but we are in between a rock and a hard place considering her hypotension and presumed volume overload. She is aware that should she decline, she would likely require ICU transfer for pressors and mechanical ventilation, which does come with risk in a patient with profound systolic CHF. She is considering whether or not these are interventions she would want. Bartholomew Crews (Friend who patient has designated as her Media planner) was bedside for this discussion.     Best practice:  Diet: NPO except meds Pain/Anxiety/Delirium protocol (if indicated): Na VAP protocol (if indicated): Na DVT prophylaxis: per primary GI prophylaxis: per primary Glucose control: per primary Mobility: BR Code Status: Full Family Communication: Patient and next of kin updated bedside.  Disposition: SDU/PCU  Labs   CBC: Recent Labs  Lab 03/14/2018 1243 03/09/18 0341  03/09/18 1557 03/09/18 2335 03/20/2018 0400 04/03/2018 2129  WBC 10.1 14.4*  --  14.0* 16.3* 19.9*  NEUTROABS  --  12.9*  --   --   --   --   HGB 5.6* 8.3* 8.8* 8.5* 8.5* 8.0*  HCT 20.0* 27.2* 29.0* 27.4* 27.8* 26.1*  MCV 81.6 82.7  --  81.5 81.5 81.8  PLT 335 359  --  297 320 664    Basic Metabolic Panel: Recent Labs  Lab 03/17/2018 0957 03/09/18 0341 03/09/18 2335 03/14/2018 0400  NA 137 135 134* 135  K 3.6 3.8 3.8 3.9  CL 105 104 101 105  CO2 22 21* 18* 15*  GLUCOSE 127* 144* 181* 140*  BUN 17 20 30* 32*  CREATININE 0.95 1.16* 1.45* 1.59*  CALCIUM 9.2 8.9 9.0 9.2   GFR: Estimated Creatinine Clearance: 32.6 mL/min (A) (by C-G formula based on SCr of 1.59 mg/dL (H)). Recent Labs  Lab 03/09/18 0341 03/09/18 1931 03/09/18 2335 03/09/2018 0400 04/04/2018 1906 03/30/2018 2129  WBC 14.4*  --  14.0* 16.3*  --  19.9*  LATICACIDVEN  --  8.2* 3.3* 2.2* 5.6*  --     Liver Function Tests: Recent Labs  Lab 03/22/2018 0400  AST 214*  ALT 172*  ALKPHOS 79  BILITOT 1.1  PROT 6.2*  ALBUMIN  2.4*   No results for input(s): LIPASE, AMYLASE in the last 168 hours. No results for input(s): AMMONIA in the last 168 hours.  ABG    Component Value Date/Time   PHART 7.455 (H) 03/09/2018 1631   PCO2ART 25.3 (L) 03/09/2018 1631   PO2ART 85.0 03/09/2018 1631   HCO3 17.8 (L) 03/09/2018 1631   TCO2 19 (L) 03/09/2018 1631   ACIDBASEDEF 5.0 (H) 03/09/2018 1631   O2SAT 97.0 03/09/2018 1631     Coagulation Profile: No results for input(s): INR, PROTIME in the last 168 hours.  Cardiac Enzymes: Recent Labs  Lab 04/01/2018 1641 03/09/18 0341 03/09/18 0945 03/09/18 1557  TROPONINI 0.73* 1.08* 1.35* 1.30*    HbA1C: Hgb A1c MFr Bld  Date/Time Value Ref Range Status  02/29/2016 05:01 AM 5.4 4.8 - 5.6 % Final    Comment:    (NOTE)         Pre-diabetes: 5.7 - 6.4         Diabetes: >6.4         Glycemic control for adults with diabetes: <7.0     CBG: Recent Labs  Lab 03/09/18 1437    GLUCAP 158*    Review of Systems:     Past Medical History  She,  has a past medical history of Breast cancer ([place]) (09/2017), CAD in native artery (02/28/2016), COPD (chronic obstructive pulmonary disease) ([place]), Dyslipidemia, goal LDL below 70, Hypertension, Ischemic cardiomyopathy, and Personal history of radiation therapy.   Surgical History    Past Surgical History:  Procedure Laterality Date  . APPENDECTOMY    . BREAST LUMPECTOMY  09/2017  . BREAST SURGERY  2000  . CARDIAC CATHETERIZATION N/A 03/02/2016   Procedure: Left Heart Cath and Coronary Angiography;  Surgeon: Belva Crome, MD;  Location: [facility];  Service: Cardiovascular;  Laterality: N/A;  . CATARACT EXTRACTION    . KNEE SURGERY       Social History   reports that she has quit smoking. She has quit using smokeless tobacco. She reports current alcohol use. She reports that she does not use drugs.   Family History   Her family history includes Heart attack in her father.   Allergies Allergies  Allergen Reactions  . Sulfa Antibiotics Swelling     Home Medications  Prior to Admission medications   Medication Sig Start Date End Date Taking? Authorizing Provider  aspirin EC 81 MG tablet Take 1 tablet (81 mg total) by mouth daily. 03/05/16  Yes Bonnielee Haff, MD  atorvastatin (LIPITOR) 40 MG tablet TAKE 1 TABLET BY MOUTH EVERY DAY Patient taking differently: Take 40 mg by mouth at bedtime.  10/11/17  Yes Lorretta Harp, MD  clopidogrel (PLAVIX) 75 MG tablet TAKE 1 TABLET BY MOUTH EVERY DAY Patient taking differently: Take 75 mg by mouth daily.  01/21/17  Yes Lorretta Harp, MD  ENTRESTO 97-103 MG TAKE 1 TABLET BY MOUTH 2 (TWO) TIMES DAILY. Patient taking differently: Take 1 tablet by mouth 2 (two) times daily.  01/13/18  Yes Lorretta Harp, MD  ezetimibe (ZETIA) 10 MG tablet TAKE 1 TABLET EVERY DAY Patient taking differently: Take 10 mg by mouth daily.  12/18/17  Yes Lorretta Harp, MD   fluticasone furoate-vilanterol (BREO ELLIPTA) 200-25 MCG/INH AEPB Inhale 1 puff into the lungs daily. 03/06/16  Yes Bonnielee Haff, MD  furosemide (LASIX) 40 MG tablet TAKE 1 TABLET BY MOUTH EVERY DAY Patient taking differently: Take 40 mg by mouth daily.  01/21/17  Yes Lorretta Harp, MD  isosorbide mononitrate (IMDUR) 30 MG 24 hr tablet TAKE 1 TABLET BY MOUTH EVERY DAY Patient taking differently: Take 30 mg by mouth daily.  01/21/17  Yes Lorretta Harp, MD  metoprolol succinate (TOPROL-XL) 25 MG 24 hr tablet TAKE 1 TABLET BY MOUTH EVERY DAY Patient taking differently: Take 25 mg by mouth daily.  01/21/17  Yes Lorretta Harp, MD  Multiple Vitamins-Minerals (ICAPS AREDS 2) CAPS Take 1 capsule by mouth 2 (two) times daily.   Yes [provider]  SPIRIVA HANDIHALER 18 MCG inhalation capsule INHALE CONTENTS OF 1 CAPSULE EVERY DAY Patient taking differently: Place 18 mcg into inhaler and inhale.  02/01/17  Yes Lorretta Harp, MD     Critical care time: 29 mins     Georgann Housekeeper, AGACNP-BC [place] Pager [phone] or [phone]  03/29/2018 10:42 PM

## 2018-03-10 NOTE — Progress Notes (Signed)
Pharmacy Antibiotic Note  Kelly Benjamin is a 78 y.o. female admitted on 03/09/2018 with pneumonia.  Pharmacy has been consulted for vancomycin/cefepime dosing.  Started on ceftriaxone/azithromycin 1/4 - now broadened. WBC 10.1>16.3- of note, prednisone started 1/5. LA 1.11>3.3>2.2. Afebrile. Scr 0.95>1.59 (CrCl 33 mL/min).   Plan: Vancomycin 1500 mg IV once Vancomycin 1000 IV every 24 hours.  Goal trough 15-20 mcg/mL. Cefepime 2g IV every 24 hours  Monitor clinical pic, cx results, and drug levels as appropriate  Height: 5\' 10"  (177.8 cm) Weight: 152 lb 12.5 oz (69.3 kg) IBW/kg (Calculated) : 68.5  Temp (24hrs), Avg:97.7 F (36.5 C), Min:97.6 F (36.4 C), Max:97.9 F (36.6 C)  Recent Labs  Lab 03/27/2018 0957 03/18/2018 1119 03/16/2018 1243 03/05/2018 1345 03/09/18 0341 03/09/18 1931 03/09/18 2335 03/24/2018 0400  WBC  --   --  10.1  --  14.4*  --  14.0* 16.3*  CREATININE 0.95  --   --   --  1.16*  --  1.45* 1.59*  LATICACIDVEN  --  1.46  --  1.11  --  8.2* 3.3* 2.2*    Estimated Creatinine Clearance: 32.6 mL/min (A) (by C-G formula based on SCr of 1.59 mg/dL (H)).    Allergies  Allergen Reactions  . Sulfa Antibiotics Swelling    Antimicrobials this admission: Ceftriaxone/azithromycin 1/4 >> 1/6 Vancomycin 1/6 >> Cefepime 1/6>>   Dose adjustments this admission: N/A  Microbiology results: 1/4 BCx: NGTD 1/6 Sputum: sent   Thank you for allowing pharmacy to be a part of this patient's care.  Antonietta Jewel, PharmD, [place] Clinical Pharmacist  Pager: [phone] Phone: [phone] 03/18/2018 7:46 PM

## 2018-03-10 NOTE — Progress Notes (Addendum)
RN paged earlier that pt's LA was high. A 500cc bolus was given slowly and next LA down to 2.7. NP reviewed critical care note. Given pt's EF of 15%, will hold further IVF for now and recheck LA in am.  Her WBCC count increased to 19000 from 16000, but she is on appropriate abx for her PNA.  Her hospital course was complicated today due to respiratory distress and pt was placed on Bipap. Per critical care, low threshold to TF pt to ICU and possibly intubate.  Her rhythm converted to Afib with RVR, but rate is not sustained over 110. Her BP is soft, so do not want to treat at present. Monitor HR and RN knows to call if sustained over 110. We perhaps could try Amiodarone, but do not feel her BP will hold if we use BB or Cardizem. Lasix is being held at present due to soft BP. Her UO is not very well charted, so will watch that and change I&O to strict monitoring.  Cardiology is on board as well.  This lady is sick and warrants close monitoring and low threshold to transfer.  KJKG, NP  [facility] Update: Morning BP up a tad. HR less than 110. RT was able to place her on O2 per [place] after a.m. ABG.  KJKG, NP [facility]

## 2018-03-10 NOTE — Progress Notes (Signed)
CRITICAL VALUE ALERT  Critical Value:  Lactic acid 5.6  Date & Time Notied:  03/09/2018@2024   Provider Notified: [facility] paged  Orders Received/Actions taken: awaiting new orders  Lajoyce Corners, RN

## 2018-03-10 NOTE — Progress Notes (Signed)
UNASSIGNED PATIENT Subjective: Mr. Saloni Lablanc is a 78 year old white female with multiple medical problems including COPD [over 50-pack years history of smoking], CAD, and hyperlipidemia, history of breast cancer on the left side status lumpectomy and radiation along with a history of NSTEMI and ischemic cardiomyopathy with most recent EF of 4040-45% presented to the hospital with severe shortness of breath on exertion. She claims is almost painful to breathe. She was found to have community-acquired pneumonia and evaluation along with severe anemia with a drop in her hemoglobin to 5.6 g/dL. The rapid response team was called today she had severe shortness of breath or diaphoresis and hypertension. All endoscopic procedures on hold because of her very fragile pulmonary and cardiac status at this time.  Objective: Vital signs in last 24 hours: Temp:  [97.6 F (36.4 C)-97.9 F (36.6 C)] 97.6 F (36.4 C) (01/06 0845) Pulse Rate:  [60-93] 75 (01/06 1348) Resp:  [18-30] 29 (01/06 1348) BP: (94-125)/(55-79) 101/65 (01/06 0845) SpO2:  [94 %-100 %] 100 % (01/06 1348) Weight:  [69.3 kg] 69.3 kg (01/06 0300) Last BM Date: 03/18/2018  Intake/Output from previous day: 01/05 0701 - 01/06 0700 In: 1214.2 [P.O.:720; IV Piggyback:494.2] Out: 775 [Urine:775] Intake/Output this shift: Total I/O In: 240 [P.O.:240] Out: -   General appearance: alert, very anxious but cooperative, appears stated age, fatigued and moderate distress; Chest exam reveals bibasilar rales with tachypnea; no wheezing heard Abdomen is soft non-tender with normal bowel sounds no hepatosplenomegaly. Skin is coarse and dry   Lab Results: Recent Labs    03/09/18 0341 03/09/18 1557 03/09/18 2335 03/09/2018 0400  WBC 14.4*  --  14.0* 16.3*  HGB 8.3* 8.8* 8.5* 8.5*  HCT 27.2* 29.0* 27.4* 27.8*  PLT 359  --  297 320   BMET Recent Labs    03/09/18 0341 03/09/18 2335 04/04/2018 0400  NA 135 134* 135  K 3.8 3.8 3.9  CL 104  101 105  CO2 21* 18* 15*  GLUCOSE 144* 181* 140*  BUN 20 30* 32*  CREATININE 1.16* 1.45* 1.59*  CALCIUM 8.9 9.0 9.2   LFT Recent Labs    03/09/2018 0400  PROT 6.2*  ALBUMIN 2.4*  AST 214*  ALT 172*  ALKPHOS 79  BILITOT 1.1   Studies/Results: Ct Chest Wo Contrast  Result Date: 03/12/2018 CLINICAL DATA:  Shortness of breath for 2 days. EXAM: CT CHEST WITHOUT CONTRAST TECHNIQUE: Multidetector CT imaging of the chest was performed following the standard protocol without IV contrast. COMPARISON:  CT chest 02/28/2016. PA and lateral chest 02/28/2016 and earlier today. FINDINGS: Cardiovascular: There is calcific aortic and coronary atherosclerosis. Heart size is normal. No pericardial effusion. Mediastinum/Nodes: No enlarged mediastinal or axillary lymph nodes. Thyroid gland, trachea, and esophagus demonstrate no significant findings. Small hiatal hernia noted. Lungs/Pleura: The patient has a small left pleural effusion. There is dense airspace disease in the anterior left upper lobe. Mild interlobular septal thickening is identified bilaterally. There is some dependent atelectatic change. Mild apical scar is noted. Ground-glass attenuating nodule in the right upper lobe seen on the prior chest CT is not visualized on this exam. Upper Abdomen: No acute abnormality. Musculoskeletal: No lytic or sclerotic lesion. Degenerative disc disease T12-L1 noted. IMPRESSION: Dense left upper lobe airspace disease is presumably pneumonia. Recommend plain film follow-up after appropriate therapy to exclude underlying neoplasm. Small left pleural effusion noted. Mild interlobular septal thickening suggestive of interstitial edema. Calcific coronary artery disease. Aortic Atherosclerosis (ICD10-I70.0) and Emphysema (ICD10-J43.9). Electronically Signed   By:  Inge Rise M.D.   On: 03/22/2018 15:18   Dg Chest Port 1 View  Result Date: 03/09/2018 CLINICAL DATA:  78 y/o  F; shortness of breath since yesterday. EXAM:  PORTABLE CHEST 1 VIEW COMPARISON:  03/08/2017 chest radiograph and chest CT. FINDINGS: Stable cardiac silhouette given projection and technique. Aortic atherosclerosis with calcification. Left mid lung zone dense consolidation is stable. Increasing hazy opacification at the lung bases, probably layering of pleural effusion. No pneumothorax. No acute osseous abnormality is evident. IMPRESSION: 1. Stable left mid lung zone consolidation, likely pneumonia. Followup PA and lateral chest X-ray is recommended in 3-4 weeks following trial of antibiotic therapy to ensure resolution and exclude underlying malignancy. 2. Increasing hazy opacification of the lung bases, probably layering of pleural effusions. Electronically Signed   By: Kristine Garbe M.D.   On: 03/09/2018 17:42   Medications: I have reviewed the patient's current medications.  Assessment/Plan: 1) Severe anemia-patient's hemoglobin has improved to from 5.6 g/dL on admission to 8.3 g/dL today after 2 units of packed red blood cells. However due to her acute shortness of breath pneumonia seen COPD exacerbation along with acute on chronic CHF all endoscopic procedures are being held for now. 2) Acute shortness of breath secondary to advanced COPD complicated by pneumonia and acute on chronic CHF. 3) Elevated troponins with known coronary artery disease.   LOS: 2 days   Radames Mejorado 03/06/2018, 2:19 PM

## 2018-03-11 ENCOUNTER — Inpatient Hospital Stay (HOSPITAL_COMMUNITY): Payer: Medicare PPO

## 2018-03-11 DIAGNOSIS — I5041 Acute combined systolic (congestive) and diastolic (congestive) heart failure: Secondary | ICD-10-CM

## 2018-03-11 DIAGNOSIS — R0603 Acute respiratory distress: Secondary | ICD-10-CM

## 2018-03-11 DIAGNOSIS — I255 Ischemic cardiomyopathy: Secondary | ICD-10-CM

## 2018-03-11 DIAGNOSIS — I214 Non-ST elevation (NSTEMI) myocardial infarction: Secondary | ICD-10-CM

## 2018-03-11 DIAGNOSIS — I4891 Unspecified atrial fibrillation: Secondary | ICD-10-CM | POA: Diagnosis not present

## 2018-03-11 DIAGNOSIS — J189 Pneumonia, unspecified organism: Secondary | ICD-10-CM

## 2018-03-11 DIAGNOSIS — R9389 Abnormal findings on diagnostic imaging of other specified body structures: Secondary | ICD-10-CM

## 2018-03-11 DIAGNOSIS — J438 Other emphysema: Secondary | ICD-10-CM

## 2018-03-11 LAB — BLOOD GAS, ARTERIAL
Acid-base deficit: 3.7 mmol/L — ABNORMAL HIGH (ref 0.0–2.0)
Bicarbonate: 19.2 mmol/L — ABNORMAL LOW (ref 20.0–28.0)
Delivery systems: POSITIVE
Drawn by: 511471
Expiratory PAP: 6
FIO2: 30
INSPIRATORY PAP: 12
Mode: POSITIVE
O2 Saturation: 98.8 %
PO2 ART: 139 mmHg — AB (ref 83.0–108.0)
Patient temperature: 97.7
RATE: 8 resp/min
pCO2 arterial: 25.5 mmHg — ABNORMAL LOW (ref 32.0–48.0)
pH, Arterial: 7.487 — ABNORMAL HIGH (ref 7.350–7.450)

## 2018-03-11 LAB — BASIC METABOLIC PANEL
Anion gap: 14 (ref 5–15)
BUN: 41 mg/dL — ABNORMAL HIGH (ref 8–23)
CO2: 18 mmol/L — ABNORMAL LOW (ref 22–32)
Calcium: 8.9 mg/dL (ref 8.9–10.3)
Chloride: 103 mmol/L (ref 98–111)
Creatinine, Ser: 1.61 mg/dL — ABNORMAL HIGH (ref 0.44–1.00)
GFR calc Af Amer: 36 mL/min — ABNORMAL LOW (ref >60)
GFR, EST NON AFRICAN AMERICAN: 31 mL/min — AB (ref >60)
Glucose, Bld: 129 mg/dL — ABNORMAL HIGH (ref 70–99)
Potassium: 3.9 mmol/L (ref 3.5–5.1)
Sodium: 135 mmol/L (ref 135–145)

## 2018-03-11 LAB — LACTIC ACID, PLASMA: Lactic Acid, Venous: 1.4 mmol/L (ref 0.5–1.9)

## 2018-03-11 MED ORDER — IPRATROPIUM-ALBUTEROL 0.5-2.5 (3) MG/3ML IN SOLN
3.0000 mL | Freq: Three times a day (TID) | RESPIRATORY_TRACT | Status: DC
  Administered 2018-03-11 – 2018-03-17 (×19): 3 mL via RESPIRATORY_TRACT
  Filled 2018-03-11 (×19): qty 3

## 2018-03-11 MED ORDER — CALCIUM CARBONATE ANTACID 500 MG PO CHEW
1.0000 | CHEWABLE_TABLET | Freq: Every day | ORAL | Status: DC
  Administered 2018-03-11 – 2018-03-14 (×4): 200 mg via ORAL
  Filled 2018-03-11 (×4): qty 1

## 2018-03-11 MED ORDER — CALCIUM CARBONATE ANTACID 500 MG PO CHEW
1.0000 | CHEWABLE_TABLET | Freq: Three times a day (TID) | ORAL | Status: DC | PRN
  Administered 2018-03-12: 200 mg via ORAL
  Filled 2018-03-11: qty 1

## 2018-03-11 MED ORDER — SODIUM CHLORIDE 0.9 % IV SOLN
500.0000 mg | INTRAVENOUS | Status: AC
  Administered 2018-03-11 – 2018-03-12 (×2): 500 mg via INTRAVENOUS
  Filled 2018-03-11 (×2): qty 500

## 2018-03-11 NOTE — Progress Notes (Addendum)
Subjective: No complaints.  No evidence of any overt GI bleeding.  Objective: Vital signs in last 24 hours: Temp:  [96.1 F (35.6 C)-97.7 F (36.5 C)] 97.4 F (36.3 C) (01/07 0800) Pulse Rate:  [71-122] 104 (01/07 1356) Resp:  [18-37] 18 (01/07 1356) BP: (82-104)/(61-76) 91/63 (01/07 1038) SpO2:  [100 %] 100 % (01/07 1356) FiO2 (%):  [30 %-35 %] 30 % (01/07 0443) Last BM Date: 03/14/2018  Intake/Output from previous day: 01/06 0701 - 01/07 0700 In: 1883.4 [P.O.:480; I.V.:117.2; IV Piggyback:1286.2] Out: 250 [Urine:250] Intake/Output this shift: Total I/O In: 108 [I.V.:108] Out: -   General appearance: alert and no distress GI: soft, non-tender; bowel sounds normal; no masses,  no organomegaly  Lab Results: Recent Labs    03/09/18 2335 03/26/2018 0400 04/04/2018 2129  WBC 14.0* 16.3* 19.9*  HGB 8.5* 8.5* 8.0*  HCT 27.4* 27.8* 26.1*  PLT 297 320 308   BMET Recent Labs    03/09/18 2335 03/31/2018 0400 03/11/18 0515  NA 134* 135 135  K 3.8 3.9 3.9  CL 101 105 103  CO2 18* 15* 18*  GLUCOSE 181* 140* 129*  BUN 30* 32* 41*  CREATININE 1.45* 1.59* 1.61*  CALCIUM 9.0 9.2 8.9   LFT Recent Labs    04/01/2018 0400  PROT 6.2*  ALBUMIN 2.4*  AST 214*  ALT 172*  ALKPHOS 79  BILITOT 1.1   PT/INR No results for input(s): LABPROT, INR in the last 72 hours. Hepatitis Panel No results for input(s): HEPBSAG, HCVAB, HEPAIGM, HEPBIGM in the last 72 hours. C-Diff No results for input(s): CDIFFTOX in the last 72 hours. Fecal Lactopherrin No results for input(s): FECLLACTOFRN in the last 72 hours.  Studies/Results: Dg Chest Port 1 View  Result Date: 03/11/2018 CLINICAL DATA:  Shortness of breath EXAM: PORTABLE CHEST 1 VIEW COMPARISON:  04/03/2018 FINDINGS: Cardiac shadow remains enlarged. Aortic calcifications are again seen. Rounded opacity is again noted in the left mid lung similar to that seen on prior plain film and CT examination. No significant improvement is noted.  Improved aeration in the right lung is noted. No bony abnormality is seen. IMPRESSION: Improved aeration in the right lung base. Stable masslike infiltrate in the left upper lobe Electronically Signed   By: Inez Catalina M.D.   On: 03/11/2018 10:37   Dg Chest Port 1 View  Result Date: 03/15/2018 CLINICAL DATA:  Dyspnea. EXAM: PORTABLE CHEST 1 VIEW COMPARISON:  Radiograph of March 09, 2018. FINDINGS: Stable cardiomegaly. Atherosclerosis of thoracic aorta is noted. No pneumothorax is noted. Stable bilateral lung opacities are noted most consistent with pneumonia. Small bilateral pleural effusions are noted. Bony thorax is unremarkable. IMPRESSION: Stable bilateral lung opacities are noted consistent with pneumonia and bilateral pleural effusions. Aortic Atherosclerosis (ICD10-I70.0). Electronically Signed   By: Marijo Conception, M.D.   On: 03/28/2018 18:23   Dg Chest Port 1 View  Result Date: 03/09/2018 CLINICAL DATA:  78 y/o  F; shortness of breath since yesterday. EXAM: PORTABLE CHEST 1 VIEW COMPARISON:  03/08/2017 chest radiograph and chest CT. FINDINGS: Stable cardiac silhouette given projection and technique. Aortic atherosclerosis with calcification. Left mid lung zone dense consolidation is stable. Increasing hazy opacification at the lung bases, probably layering of pleural effusion. No pneumothorax. No acute osseous abnormality is evident. IMPRESSION: 1. Stable left mid lung zone consolidation, likely pneumonia. Followup PA and lateral chest X-ray is recommended in 3-4 weeks following trial of antibiotic therapy to ensure resolution and exclude underlying malignancy. 2. Increasing hazy opacification  of the lung bases, probably layering of pleural effusions. Electronically Signed   By: Kristine Garbe M.D.   On: 03/09/2018 17:42    Medications:  Scheduled: . aspirin EC  81 mg Oral Daily  . atorvastatin  40 mg Oral QHS  . calcium carbonate  1 tablet Oral Daily  . ezetimibe  10 mg Oral  Daily  . fluticasone furoate-vilanterol  1 puff Inhalation Daily  . ipratropium-albuterol  3 mL Nebulization TID  . metoprolol succinate  25 mg Oral Daily  . multivitamin  1 tablet Oral BID  . pantoprazole  40 mg Oral Daily  . sodium bicarbonate  650 mg Oral TID  . sodium chloride flush  3 mL Intravenous Q12H   Continuous: . sodium chloride Stopped (03/11/18 1553)  . azithromycin 500 mg (03/11/18 1553)  . ceFEPime (MAXIPIME) IV    . vancomycin      Assessment/Plan: 1) Anemia. 2) Pneumonia.   The patient feels better from the respiratory standpoint, but she requires 5 liters of nasal canula.  Again, she is not in a position to undergo a safe endoscopic procedure.    Plan: 1) Continue with her current therapy. 2) Follow HGB and transfuse as necessary. 3) An EGD can be performed if her oxygen requirements drop down to 2 liters or less of nasal canula. 4) TUMS - she requests the medication to help with some burning sensation in the epigastrium.  LOS: 3 days   Jazminn Pomales D 03/11/2018, 4:05 PM

## 2018-03-11 NOTE — Progress Notes (Signed)
Patient placed on 4L HFNC from BiPAP per ABG.  Patient vitals stable satting 100%.  RT will continue to monitor.

## 2018-03-11 NOTE — Assessment & Plan Note (Signed)
Mass-like infiltrate in the anterior segment of the left upper lobe of the lung. Needs radiographic follow-up in 6 weeks (mid-February) to rule out lung mass.

## 2018-03-11 NOTE — Progress Notes (Signed)
NAME:  Kelly Benjamin, MRN:  [id number], DOB:  05/21/1940, LOS: 3 ADMISSION DATE:  03/10/2018, CONSULTATION DATE:  1/6 REFERRING MD:  Dr. Karleen Hampshire, CHIEF COMPLAINT:  Dyspnea, hypotension   Brief History   78 year old female admitted for CAP. Course became complicated by worsening dyspnea and hypotension requiring BiPAP and volume resuscitation, but EF if 15%.   History of present illness   78 year old female with PMH as below, which is significant for CAD, CHF, COPD, and HTN. She presented to [facility] ED 1/4 with complaints of dyspnea on exertion for several days accompanied by leg swelling, but she denied chest pain at that time. She was found to have hemoglobin of 5.6, positive troponin, and LUL opacification on CXR. She was admitted to the hospitalists for treatment of CAP and blood transfusions. Cardiology had been consulted and felt as though she was volume overloaded. She was treated with IV diuresis and underwhelming response. Her troponin was elevated and peaked at 1.35 and has trended down. No plans for cardiac intervention at this time. GI following with no plans for endoscopic workup until contidion optimized. Then 1/6 her course became complicated by worsening dyspnea and intermittent hypotension. Her BP somewhat responded to volume, and she was started on BiPAP, which she tolerated pretty well. Lactic acid rose to 5.6 and her antibiotics were broadened for fear of sepsis.   Past Medical History   has a past medical history of Breast cancer ([place]) (09/2017), CAD in native artery (02/28/2016), COPD (chronic obstructive pulmonary disease) ([place]), Dyslipidemia, goal LDL below 70, Hypertension, Ischemic cardiomyopathy, and Personal history of radiation therapy.  Significant Hospital Events   1/4 admit 1/6 hypotension, dyspnea, started on BiPAP.  Resolved on 03/11/2017  Consults:  Cardiology Gastroenerololy Pulmonary 1/6  Procedures:    Significant Diagnostic Tests:  CT chest 1/4 >  Dense left upper lobe airspace disease is presumably pneumonia. Recommend plain film follow-up after appropriate therapy to exclude underlying neoplasm. Small left pleural effusion noted. Mild interlobular septal thickening suggestive of interstitial edema. Echo 1/6 > LVEF 15-20%, Diffuse hypokinesis. Grade 2 diastolic dysfunction. Mod MR, RV systolic function mod reduced. Mod TR. PA peak pressure at 41 mmHg.   Micro Data:  Blood 1/4 > Sputum 1/4 >  Antimicrobials:  Ceftriaxone 1/4 > 1/6 Azithromycin 1/4 > 1/6 Cefepime 1/6 > Vancomycin 1/6 >  Interim history/subjective:  Reports feeling better.  No acute distress on nasal cannula.  No chest x-ray available today.  Objective   Blood pressure 93/61, pulse 93, temperature (!) 97.4 F (36.3 C), temperature source Oral, resp. rate (!) 23, height 5\' 10"  (1.778 m), weight 69.3 kg, SpO2 100 %.    FiO2 (%):  [30 %-35 %] 30 %   Intake/Output Summary (Last 24 hours) at 03/11/2018 0086 Last data filed at 03/11/2018 0505 Gross per 24 hour  Intake 1643.41 ml  Output 250 ml  Net 1393.41 ml   Filed Weights   04/02/2018 1703 03/09/18 0601 03/11/2018 0300  Weight: 67.1 kg 68 kg 69.3 kg    Examination: General: Frail elderly female who reports being better today HEENT: JVD or lymphadenopathy is appreciated Neuro: Awake alert intact CV: Heart sounds are irregular with ventricular rate of 121 and atrial for was noted PULM: Priest breath sounds in the bases currently on nasal cannula PY:PPJK, non-tender, bsx4 active  Extremities: warm/dry, 1+ edema  Skin: no rashes or lesions   Resolved Hospital Problem list     Assessment & Plan:  Acute hypoxemic respiratory failure, which is multifactorial secondary to community acquired pneumonia and CHF exacerbation.  -PRN BiPAP -O2 as needed -Bronchodilators  CAP -Continue broad-spectrum antibiotics which consist of Zithromax cefepime and vancomycin -Culture data monitor  Acute exacerbation of  HFrEF:  EF 15-20% on echo. This is a sharp decline from her last echo (40-45%).  Atrial fibrillation.  Cardiology is following Hold diuresis Gentle fluid resuscitation has been completed  COPD +/- acute exacerbation -Due to bronchodilators -Short course of steroids started on prednisone 03/14/2018  Hypotension improved s/p 2.5L IVF resuscitation today. Lactic acid increased to 5.6, but has since cleared to 2.7. Suspect lactic acidosis is multifactorial due to hypotension and work of breathing. MAP on my evaluation is 86mmHg.  -Status post fluid resuscitation no further resuscitation at this time -Lactic acid is noted to be clearing -Hypertension is currently resolved on 03/11/2018 0 800  Global: 03/11/2018 she has improved with fluids and does not require transfer to intensive care unit at this time.      Best practice:  Diet: Advance diet Pain/Anxiety/Delirium protocol (if indicated): Na VAP protocol (if indicated): Na DVT prophylaxis: per primary GI prophylaxis: per primary Glucose control: per primary Mobility: BR Code Status: Full Family Communication: 03/11/2018 patient and family updated at bedside Disposition: SDU/PCU  Labs   CBC: Recent Labs  Lab 04/02/2018 1243 03/09/18 0341 03/09/18 1557 03/09/18 2335 03/26/2018 0400 03/31/2018 2129  WBC 10.1 14.4*  --  14.0* 16.3* 19.9*  NEUTROABS  --  12.9*  --   --   --   --   HGB 5.6* 8.3* 8.8* 8.5* 8.5* 8.0*  HCT 20.0* 27.2* 29.0* 27.4* 27.8* 26.1*  MCV 81.6 82.7  --  81.5 81.5 81.8  PLT 335 359  --  297 320 761    Basic Metabolic Panel: Recent Labs  Lab 03/31/2018 0957 03/09/18 0341 03/09/18 2335 03/12/2018 0400 03/11/18 0515  NA 137 135 134* 135 135  K 3.6 3.8 3.8 3.9 3.9  CL 105 104 101 105 103  CO2 22 21* 18* 15* 18*  GLUCOSE 127* 144* 181* 140* 129*  BUN 17 20 30* 32* 41*  CREATININE 0.95 1.16* 1.45* 1.59* 1.61*  CALCIUM 9.2 8.9 9.0 9.2 8.9   GFR: Estimated Creatinine Clearance: 32.1 mL/min (A) (by C-G formula  based on SCr of 1.61 mg/dL (H)). Recent Labs  Lab 03/09/18 0341  03/09/18 2335 03/30/2018 0400 03/25/2018 1906 03/22/2018 2129 03/11/18 0515  WBC 14.4*  --  14.0* 16.3*  --  19.9*  --   LATICACIDVEN  --    < > 3.3* 2.2* 5.6* 2.7* 1.4   < > = values in this interval not displayed.    Liver Function Tests: Recent Labs  Lab 03/22/2018 0400  AST 214*  ALT 172*  ALKPHOS 79  BILITOT 1.1  PROT 6.2*  ALBUMIN 2.4*   No results for input(s): LIPASE, AMYLASE in the last 168 hours. No results for input(s): AMMONIA in the last 168 hours.  ABG    Component Value Date/Time   PHART 7.487 (H) 03/11/2018 0440   PCO2ART 25.5 (L) 03/11/2018 0440   PO2ART 139 (H) 03/11/2018 0440   HCO3 19.2 (L) 03/11/2018 0440   TCO2 19 (L) 03/09/2018 1631   ACIDBASEDEF 3.7 (H) 03/11/2018 0440   O2SAT 98.8 03/11/2018 0440     Coagulation Profile: No results for input(s): INR, PROTIME in the last 168 hours.  Cardiac Enzymes: Recent Labs  Lab 03/06/2018 1641 03/09/18 0341 03/09/18 0945 03/09/18 1557  TROPONINI 0.73* 1.08* 1.35* 1.30*    HbA1C: Hgb A1c MFr Bld  Date/Time Value Ref Range Status  02/29/2016 05:01 AM 5.4 4.8 - 5.6 % Final    Comment:    (NOTE)         Pre-diabetes: 5.7 - 6.4         Diabetes: >6.4         Glycemic control for adults with diabetes: <7.0     CBG: Recent Labs  Lab 03/09/18 [place] ACNP Maryanna Shape PCCM Pager [phone] till 1 pm If no answer page [phone] 03/11/2018, 9:52 AM

## 2018-03-11 NOTE — Care Management Important Message (Signed)
Important Message  Patient Details  Name: Kelly Benjamin MRN: [id number] Date of Birth: Jan 09, 1941   Medicare Important Message Given:  Yes    Gema Ringold P Williamson 03/11/2018, 2:23 PM

## 2018-03-11 NOTE — Progress Notes (Signed)
Pt not currently on bipap. Hi flow [place] @ 6 lpm. Pt states she is breathing better.

## 2018-03-11 NOTE — Progress Notes (Addendum)
Progress Note  Patient Name: Kelly Benjamin Date of Encounter: 03/11/2018  Primary Cardiologist: No primary care provider on file.   Subjective   Rapid response called yesterday evening due to worsening shortness of breath with diaphoresis and hypotension. BP 82/61 at that time. Patient was placed on BiPAP and started on IV fluids. Critical Care was consulted. Patient has since been weaned of BiPAP.  Patient feels like her breathing has improved since yesterday; however, she is still clearly dyspneic. She no longer is having any pleuritic chest pain.  Inpatient Medications    Scheduled Meds: . aspirin EC  81 mg Oral Daily  . atorvastatin  40 mg Oral QHS  . calcium carbonate  1 tablet Oral Daily  . ezetimibe  10 mg Oral Daily  . fluticasone furoate-vilanterol  1 puff Inhalation Daily  . ipratropium-albuterol  3 mL Nebulization TID  . metoprolol succinate  25 mg Oral Daily  . multivitamin  1 tablet Oral BID  . pantoprazole  40 mg Oral Daily  . sodium bicarbonate  650 mg Oral TID  . sodium chloride flush  3 mL Intravenous Q12H   Continuous Infusions: . sodium chloride 10 mL/hr at 03/11/18 0505  . azithromycin    . ceFEPime (MAXIPIME) IV    . vancomycin     PRN Meds: sodium chloride, acetaminophen, LORazepam, ondansetron (ZOFRAN) IV, sodium chloride flush   Vital Signs    Vitals:   03/11/18 0754 03/11/18 0800 03/11/18 0900 03/11/18 1038  BP:  93/61  91/63  Pulse: (!) 102 93 (!) 114 (!) 108  Resp: (!) 23 (!) 23 (!) 37   Temp:  (!) 97.4 F (36.3 C)    TempSrc:  Oral    SpO2: 100% 100% 100%   Weight:      Height:        Intake/Output Summary (Last 24 hours) at 03/11/2018 1100 Last data filed at 03/11/2018 0730 Gross per 24 hour  Intake 1643.41 ml  Output 250 ml  Net 1393.41 ml   Filed Weights   03/26/2018 1703 03/09/18 0601 03/27/2018 0300  Weight: 67.1 kg 68 kg 69.3 kg    Telemetry    Atrial fibrillation with ventricular rates in the 90's to 120's. - Personally  Reviewed  Physical Exam   GEN: Elderly Caucasian female visibly dyspneic but not severe distress at this time. Currently on supplemental O2 via nasal cannula.  Neck: Supple. Cardiac: Tachycardic with irregularly irregular rhythm. No murmurs, rubs, or gallops.  Respiratory: Tachypneic with increased work of breathing. Course crackles in bilateral bases.  GI: Abdomen soft, non-distended, and non-tender to palpation. Bowel sounds present. MS: Mild pitting edema of bilateral lower extremities. Skin: Warm and dry. Neuro:  No focal deficits. Psych: Normal affect.  Labs    Chemistry Recent Labs  Lab 03/09/18 2335 03/06/2018 0400 03/11/18 0515  NA 134* 135 135  K 3.8 3.9 3.9  CL 101 105 103  CO2 18* 15* 18*  GLUCOSE 181* 140* 129*  BUN 30* 32* 41*  CREATININE 1.45* 1.59* 1.61*  CALCIUM 9.0 9.2 8.9  PROT  --  6.2*  --   ALBUMIN  --  2.4*  --   AST  --  214*  --   ALT  --  172*  --   ALKPHOS  --  79  --   BILITOT  --  1.1  --   GFRNONAA 35* 31* 31*  GFRAA 40* 36* 36*  ANIONGAP 15 15 14  Hematology Recent Labs  Lab 03/09/18 2335 03/11/2018 0400 03/20/2018 2129  WBC 14.0* 16.3* 19.9*  RBC 3.36* 3.41* 3.19*  HGB 8.5* 8.5* 8.0*  HCT 27.4* 27.8* 26.1*  MCV 81.5 81.5 81.8  MCH 25.3* 24.9* 25.1*  MCHC 31.0 30.6 30.7  RDW 17.8* 17.9* 18.5*  PLT 297 320 308    Cardiac Enzymes Recent Labs  Lab 03/11/2018 1641 03/09/18 0341 03/09/18 0945 03/09/18 1557  TROPONINI 0.73* 1.08* 1.35* 1.30*    Recent Labs  Lab 03/12/2018 1008  TROPIPOC 0.36*     BNP Recent Labs  Lab 03/29/2018 0957  BNP 3,356.1*     DDimer No results for input(s): DDIMER in the last 168 hours.   Radiology    Dg Chest Port 1 View  Result Date: 03/11/2018 CLINICAL DATA:  Shortness of breath EXAM: PORTABLE CHEST 1 VIEW COMPARISON:  03/18/2018 FINDINGS: Cardiac shadow remains enlarged. Aortic calcifications are again seen. Rounded opacity is again noted in the left mid lung similar to that seen on prior  plain film and CT examination. No significant improvement is noted. Improved aeration in the right lung is noted. No bony abnormality is seen. IMPRESSION: Improved aeration in the right lung base. Stable masslike infiltrate in the left upper lobe Electronically Signed   By: Inez Catalina M.D.   On: 03/11/2018 10:37   Dg Chest Port 1 View  Result Date: 04/02/2018 CLINICAL DATA:  Dyspnea. EXAM: PORTABLE CHEST 1 VIEW COMPARISON:  Radiograph of March 09, 2018. FINDINGS: Stable cardiomegaly. Atherosclerosis of thoracic aorta is noted. No pneumothorax is noted. Stable bilateral lung opacities are noted most consistent with pneumonia. Small bilateral pleural effusions are noted. Bony thorax is unremarkable. IMPRESSION: Stable bilateral lung opacities are noted consistent with pneumonia and bilateral pleural effusions. Aortic Atherosclerosis (ICD10-I70.0). Electronically Signed   By: Marijo Conception, M.D.   On: 03/17/2018 18:23   Dg Chest Port 1 View  Result Date: 03/09/2018 CLINICAL DATA:  78 y/o  F; shortness of breath since yesterday. EXAM: PORTABLE CHEST 1 VIEW COMPARISON:  03/08/2017 chest radiograph and chest CT. FINDINGS: Stable cardiac silhouette given projection and technique. Aortic atherosclerosis with calcification. Left mid lung zone dense consolidation is stable. Increasing hazy opacification at the lung bases, probably layering of pleural effusion. No pneumothorax. No acute osseous abnormality is evident. IMPRESSION: 1. Stable left mid lung zone consolidation, likely pneumonia. Followup PA and lateral chest X-ray is recommended in 3-4 weeks following trial of antibiotic therapy to ensure resolution and exclude underlying malignancy. 2. Increasing hazy opacification of the lung bases, probably layering of pleural effusions. Electronically Signed   By: Kristine Garbe M.D.   On: 03/09/2018 17:42    Cardiac Studies   Echocardiogram 06/20/2016: Study Conclusions: - Left ventricle: The cavity  size was normal. There was mild   concentric hypertrophy. Systolic function was mildly to   moderately reduced. The estimated ejection fraction was in the   range of 40% to 45%. Hypokinesis of the anterior and   anterolateral with akinesis of the inferolateral myocardium.   Doppler parameters are consistent with abnormal left ventricular   relaxation (grade 1 diastolic dysfunction). Doppler parameters   are consistent with indeterminate ventricular filling pressure. - Aortic valve: Transvalvular velocity was within the normal range.   There was no stenosis. There was no regurgitation. - Mitral valve: Transvalvular velocity was within the normal range.   There was no evidence for stenosis. There was mild regurgitation. - Right ventricle: Systolic function was normal. - Atrial  septum: No defect or patent foramen ovale was identified   by color flow Doppler. - Tricuspid valve: There was trivial regurgitation. - Pulmonary arteries: Systolic pressure was within the normal   range. PA peak pressure: 21 mm Hg (S). _______________  Left Heart Catheterization 03/02/2016:  Prox RCA to Mid RCA lesion, 90 %stenosed.  Dist LAD lesion, 85 %stenosed.    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 The right coronary is a very large territory and also has left right collaterals from the left coronary system. The likely scenario is the patient's inferolateral infarction occurred due to thrombotic occlusion and she subsequently had on pain is recanalization.  Widely patent circumflex.  Diffuse 85-90% apical LAD disease.  Elevated left ventricular end-diastolic pressure. Previously documented LVEF in the 30-35% range by noninvasive imaging.  Recommendations:  In absence of angina or ischemia on nuclear scintigraphy, medical therapy would appear to be the treatment of choice.  LV dysfunction is likely secondary to chronic remodeling  following inferolateral infarction which occurred remotely.  Guideline mandated heart failure therapy. Discontinuation amlodipine will allow further up titration of heart failure agents.  Patient Profile   Ms. Corker is a 78 y.o. female with a history of of CAD, ischemic cardiomyopathy with EF of 40-45% on Echo in 06/2016, hyperlipidemia, COPD, and left breast cancer s/p radiation and lumpectomy who presented to [facility] ED on 03[facility]08/2017 for evaluation of worsening shortness of breath and edema. On arrival, she was found to have profound anemia with a hemoglobin of 5.6. Chest x-ray consistent with left upper lobe pneumonia. Troponin also elevated. Cardiology was consulted for acute on chronic systolic CHF and NSTEMI.  Assessment & Plan    Acute Hypoxemic Respiratory Failure / Acute Systolic CHF with Newly Reduced EF - Likely multifactorial. Currently being treated for pneumonia and COPD exacerbation. However, exam and labs also consistent with acute on chronic CHF. - BNP elevated at 3,356.1. - Repeat chest x-ray yesterday showed stable left mid lung zone consolidation with increasing hazy opacification of the lung bases (probably layering pleural effusions). - Echo yesterday showed LVEF of 15-20% (down from 40-45% in 06/2016) with diffuse hypokinesis and grade 2 diastolic dysfunction. With newly reduced EF, will need cardiac catheterization when patient has recovered form this acute illness.  - Diuresis limited yesterday by hypotension. Documented output of 250 mL in the past 24 hours and overall net positive this admission.  - Will hold diuresis at this time due to  hypotension. Most recent BP 91/63. - Continue low dose beta-blocker as BP allows. - NO ACEi/ARB/ARNI right now due to hypotension and renal function.  - Continue to monitor daily weights, strict I/O's, and renal function.  New Onset Atrial Fibrillation - Patient went into atrial fibrillation with RVR last night.  - Telemetry  shows atrial fibrillation with ventricular rates in the 90's to 120's. Suspect this is secondary to acute illnesses. - No beta-blocker or calcium channel blocker at this time due to borderline hypotension.  - Consider Amiodarone. Digoxin not a option right now due to renal function. - Will defer any anticoagulation to MD and primary team given acute anemia.   Elevated Troponin with Known CAD - Troponin peaked at 1.35 and is trending down. - Symptoms concerning for ACS with shortness of breath being an anginal equivalent. Last cardiac catheterization in 02/2016 showed 90% RCA lesion and 85% distal LAD lesion. RCA is recanalized CTO and has left to right collaterals. -  With anemia of unknown origin, will hold off on any anticoagulation at this time. Cannot proceed with cardiac catheterization until etiology of anemia is determined and corrected. - OK to continue Aspirin at this time given hemoglobin is stable but will need to monitor closely.  - Continue Lipitor and Zetia. - Continue beta-blocker. - Will hold Imdur given borderline BP. Most recent BP soft but stable at 101/65.  - Patient reports pleuritic chest pain today.  Acute Anemia of Unknown Etiology - Hemoglobin 5.6 on admission (11.3 in 02/2016).  - Patient received 2 units of PRBC.  - Hemoglobin stable at 8.5 yesterday. - GI is following patient - holding on endoscopy until patient is more stable for pulmonary and cardiology standpoint.  Community Acquired Pneumonia - Continue antibiotics per primary team.    For questions or updates, please contact Miguel Barrera Please consult [email or web address] for contact info under        Signed, Darreld Mclean, PA-C  03/11/2018, 11:00 AM    ---------------------------------------------------------------------------------------------   History and all data above reviewed.  Patient examined.  I agree with the findings as above.  Kelly Benjamin is feeling better today but not back to  baseline. Had a rapid response called yesterday for hypotension and acute dyspnea.   Constitutional: No acute distress, thin, elderly.  Cardiovascular: irregular rhythm, variable rate, no murmurs. S1 and S2 normal. Respiratory: diffuse rhonchi. GI : normal bowel sounds, soft and nontender. No distention.   MSK: extremities warm, well perfused. No edema.  NEURO: grossly nonfocal exam, moves all extremities. PSYCH: alert and oriented x 3, normal mood and affect.   All available labs, radiology testing, previous records reviewed. Agree with documented assessment and plan of my colleague as stated above with the following additions or changes:  Principal Problem:   PNA (pneumonia) Active Problems:   COPD (chronic obstructive pulmonary disease) (HCC)   SOB (shortness of breath)   Ischemic cardiomyopathy   Normocytic anemia   NSTEMI (non-ST elevated myocardial infarction) (HCC)   Abnormal CXR   Atrial fibrillation ([place])    Plan: Challenging situation with newly reduced ejection fraction, likely related to overall medical illness.  She is now also in atrial fibrillation.  She has had marginal blood pressure, no heart failure therapy will be able to be initiated at this time.  She has been diuresed over the past several days, and may in fact be dry.  She was given fluids and responded well yesterday evening and feels better today.  We will hold diuresis for at least a day or 2.  She responded well to IV steroids, which may be improving her respiratory status.  Recommendation for coronary angiography still stands, limited by evaluation from gastroenterology for bleeding and acute anemia.  Now with a newly reduced ejection fraction, it will be important to define her coronary anatomy when she is stable from a medical and bleeding standpoint.  Perhaps in a few days after respiratory cares, a gastroenterology evaluation can be performed, after which we can consider heart failure therapy and coronary  angiography.  Cardiology will continue to follow.  Please hold Lasix.  Please continue low-dose beta-blockade as tolerated.   Length of Stay:  LOS: 3 days   Elouise Munroe, MD [facility] 12:31 PM  03/11/2018

## 2018-03-11 NOTE — Progress Notes (Signed)
PROGRESS NOTE    Kelly Benjamin  HAL:[id number] DOB: 1940-07-16 DOA: 03/22/2018 PCP: Maurice Small, MD    Brief Narrative: Kelly Benjamin is a 78 y.o. female with a hx of CAD with history of NSTEMI, ischemic cardiomyopathy with most recent EF 40-45%, hyperlipidemia, COPD, history of left breast cancer status post radiation and lumpectomy presents with severe dyspnea on exertion.  On arrival patient was found to have profound anemia with a hemoglobin of 5.6.  She was admitted for evaluation of symptomatic anemia and acute on chronic systolic heart failure and NSTEMI.   Assessment & Plan:   Principal Problem:   PNA (pneumonia) Active Problems:   COPD (chronic obstructive pulmonary disease) (HCC)   SOB (shortness of breath)   Ischemic cardiomyopathy   Normocytic anemia   NSTEMI (non-ST elevated myocardial infarction) (HCC)   Abnormal CXR   Atrial fibrillation (HCC)   Acute respiratory failure with hypoxia probably secondary to a combination of pneumonia, COPD and acute systolic heart failure  Suspect, patient has  sepsis from  left upper lobe pneumonia/ community-acquired pneumonia. She was started on IV Rocephin and IV Zithromax, since she had worsening dyspnea, requiring BIPAP yesterday, we have broadened the coverage to cefepime and vancomycin.   Blood cultures have been negative so far.  Nasal cannula oxygen to keep sats greater than 90%.  Sputum cultures ordered if able to get sputum.   Lactic acid level improving.    Mild COPD exacerbation Start the patient on duo nebs and brief trial of prednisone.   Add PPI for GI prophylaxis. No wheezing heard today on exam.  But she continues to have coarse breath sounds, tachypnea.   Acute on chronic systolic heart failure/ ? NICM: BNP on admission was greater than 3000.  Patient was started on IV Lasix 40 mg twice daily and cardiology consulted. Over the last 24 hours, pt became hypotensive and diaphoretic with IV lasix, and it was  held .   Echocardiogram showed The cavity size was normal. Wall thickness was   increased in a pattern of mild LVH. Systolic function was severely reduced. The estimated ejection fraction was in the  range of 15% to 20%. Diffuse hypokinesis. Features are consistent with a pseudonormal left ventricular filling pattern, with  concomitant abnormal relaxation and increased filling pressure (grade 2 diastolic dysfunction).  Lasix is being held today , cardiology to recommend on possible cardiac cath.  Continue with strict intake and output and daily weights and watch renal parameters.   Acute on anemia of chronic disease Patient's baseline hemoglobin is around 11. She was admitted with a hemoglobin of 5.6, received 2 units of PRBC transfusion and repeat hemoglobin is stable around 8. No obvious signs of bleeding.  She denies any hematemesis, hematochezia, melena Stool for occult blood ordered and anemia panel shows normal iron, low to normal ferritin, normal B12 .  Patient denies getting any previous colonoscopy or EGD. Gi consulted for recommendations and possible EGD when her respiratory status improves.    Elevated troponins probably from demand ischemia from heart failure and  symptomatic anemia. Further recommendations as per cardiology.   History of coronary artery disease and ischemic cardiomyopathy Patient at home is on aspirin 81 mg and Plavix 75 mg daily. Currently she is chest pain-free.  Plavix held on admission and currently  on aspirin 81 daily. Resume beta-blocker and statin. Cardiology on board .    Hyperlipidemia Resume Zetia and statin.   H/o chronic atrial fibrillation:  Rate not  well controlled with metoprolol. ? Amiodarone. Defer to cardiology.  No IV heparin due to severe anemia. GI work up on hold.   DVT prophylaxis: SCDs Code Status: Full code Family Communication: None at bedside Disposition Plan: Pending clinical improvement and further  evaluation   Consultants:   Cardiology  Gastroenterology.   Procedures: Echocardiogram Antimicrobials: IV Rocephin and Zithromax since admission  Subjective: She feels better today. Off BIPAP, and on high flow [place] oxygen.  No chest pain.  No nausea or vomiting.   Objective: Vitals:   03/11/18 0754 03/11/18 0800 03/11/18 0900 03/11/18 1038  BP:  93/61  91/63  Pulse: (!) 102 93 (!) 114 (!) 108  Resp: (!) 23 (!) 23 (!) 37   Temp:  (!) 97.4 F (36.3 C)    TempSrc:  Oral    SpO2: 100% 100% 100%   Weight:      Height:        Intake/Output Summary (Last 24 hours) at 03/11/2018 1133 Last data filed at 03/11/2018 0730 Gross per 24 hour  Intake 1643.41 ml  Output 250 ml  Net 1393.41 ml   Filed Weights   03/09/2018 1703 03/09/18 0601 03/16/2018 0300  Weight: 67.1 kg 68 kg 69.3 kg    Examination:  General exam: alert and on 6 lit of [place] OXYGEN.  Respiratory system: Tachypnea, basilar Rales, no wheezing heard. Air entry fair.  Cardiovascular system: S1 & S2 heard, tachycardic irregular.  Gastrointestinal system: Abdomen is soft non tender non distended bowel sounds good.  Central nervous system: Alert and oriented,  grossly nonfocal Extremities: no pedal edema or cyanosis.  Skin: No rashes, lesions or ulcers Psychiatry: Anxious looking    Data Reviewed: I have personally reviewed following labs and imaging studies  CBC: Recent Labs  Lab 03/25/2018 1243 03/09/18 0341 03/09/18 1557 03/09/18 2335 03/16/2018 0400 03/08/2018 2129  WBC 10.1 14.4*  --  14.0* 16.3* 19.9*  NEUTROABS  --  12.9*  --   --   --   --   HGB 5.6* 8.3* 8.8* 8.5* 8.5* 8.0*  HCT 20.0* 27.2* 29.0* 27.4* 27.8* 26.1*  MCV 81.6 82.7  --  81.5 81.5 81.8  PLT 335 359  --  297 320 741   Basic Metabolic Panel: Recent Labs  Lab 03/13/2018 0957 03/09/18 0341 03/09/18 2335 03/05/2018 0400 03/11/18 0515  NA 137 135 134* 135 135  K 3.6 3.8 3.8 3.9 3.9  CL 105 104 101 105 103  CO2 22 21* 18* 15* 18*  GLUCOSE 127*  144* 181* 140* 129*  BUN 17 20 30* 32* 41*  CREATININE 0.95 1.16* 1.45* 1.59* 1.61*  CALCIUM 9.2 8.9 9.0 9.2 8.9   GFR: Estimated Creatinine Clearance: 32.1 mL/min (A) (by C-G formula based on SCr of 1.61 mg/dL (H)). Liver Function Tests: Recent Labs  Lab 04/01/2018 0400  AST 214*  ALT 172*  ALKPHOS 79  BILITOT 1.1  PROT 6.2*  ALBUMIN 2.4*   No results for input(s): LIPASE, AMYLASE in the last 168 hours. No results for input(s): AMMONIA in the last 168 hours. Coagulation Profile: No results for input(s): INR, PROTIME in the last 168 hours. Cardiac Enzymes: Recent Labs  Lab 03/19/2018 1641 03/09/18 0341 03/09/18 0945 03/09/18 1557  TROPONINI 0.73* 1.08* 1.35* 1.30*   BNP (last 3 results) No results for input(s): PROBNP in the last 8760 hours. HbA1C: No results for input(s): HGBA1C in the last 72 hours. CBG: Recent Labs  Lab 03/09/18 1437  GLUCAP 158*  Lipid Profile: No results for input(s): CHOL, HDL, LDLCALC, TRIG, CHOLHDL, LDLDIRECT in the last 72 hours. Thyroid Function Tests: No results for input(s): TSH, T4TOTAL, FREET4, T3FREE, THYROIDAB in the last 72 hours. Anemia Panel: Recent Labs    03/09/18 1151  VITAMINB12 656  FOLATE 6.9  FERRITIN 54  TIBC 305  IRON 44  RETICCTPCT 1.7   Sepsis Labs: Recent Labs  Lab 03/07/2018 0400 03/05/2018 1906 04/01/2018 2129 03/11/18 0515  LATICACIDVEN 2.2* 5.6* 2.7* 1.4    Recent Results (from the past 240 hour(s))  Culture, blood (routine x 2) Call MD if unable to obtain prior to antibiotics being given     Status: None (Preliminary result)   Collection Time: 03/29/2018  4:14 PM  Result Value Ref Range Status   Specimen Description BLOOD RIGHT ARM  Final   Special Requests   Final    BOTTLES DRAWN AEROBIC AND ANAEROBIC Blood Culture results may not be optimal due to an inadequate volume of blood received in culture bottles   Culture   Final    NO GROWTH 3 DAYS Performed at [facility], [place] [address],  [place], [place] [address]    Report Status PENDING  Incomplete  Culture, blood (routine x 2) Call MD if unable to obtain prior to antibiotics being given     Status: None (Preliminary result)   Collection Time: 03/12/2018  4:21 PM  Result Value Ref Range Status   Specimen Description BLOOD RIGHT HAND  Final   Special Requests   Final    AEROBIC BOTTLE ONLY Blood Culture results may not be optimal due to an inadequate volume of blood received in culture bottles   Culture   Final    NO GROWTH 3 DAYS Performed at [facility], [place] [address] [place], [place] [address]    Report Status PENDING  Incomplete         Radiology Studies: Dg Chest Port 1 View  Result Date: 03/11/2018 CLINICAL DATA:  Shortness of breath EXAM: PORTABLE CHEST 1 VIEW COMPARISON:  03/16/2018 FINDINGS: Cardiac shadow remains enlarged. Aortic calcifications are again seen. Rounded opacity is again noted in the left mid lung similar to that seen on prior plain film and CT examination. No significant improvement is noted. Improved aeration in the right lung is noted. No bony abnormality is seen. IMPRESSION: Improved aeration in the right lung base. Stable masslike infiltrate in the left upper lobe Electronically Signed   By: Inez Catalina M.D.   On: 03/11/2018 10:37   Dg Chest Port 1 View  Result Date: 03/11/2018 CLINICAL DATA:  Dyspnea. EXAM: PORTABLE CHEST 1 VIEW COMPARISON:  Radiograph of March 09, 2018. FINDINGS: Stable cardiomegaly. Atherosclerosis of thoracic aorta is noted. No pneumothorax is noted. Stable bilateral lung opacities are noted most consistent with pneumonia. Small bilateral pleural effusions are noted. Bony thorax is unremarkable. IMPRESSION: Stable bilateral lung opacities are noted consistent with pneumonia and bilateral pleural effusions. Aortic Atherosclerosis (ICD10-I70.0). Electronically Signed   By: Marijo Conception, M.D.   On: 03/06/2018 18:23   Dg Chest Port 1 View  Result Date:  03/09/2018 CLINICAL DATA:  78 y/o  F; shortness of breath since yesterday. EXAM: PORTABLE CHEST 1 VIEW COMPARISON:  03/08/2017 chest radiograph and chest CT. FINDINGS: Stable cardiac silhouette given projection and technique. Aortic atherosclerosis with calcification. Left mid lung zone dense consolidation is stable. Increasing hazy opacification at the lung bases, probably layering of pleural effusion. No pneumothorax. No acute osseous  abnormality is evident. IMPRESSION: 1. Stable left mid lung zone consolidation, likely pneumonia. Followup PA and lateral chest X-ray is recommended in 3-4 weeks following trial of antibiotic therapy to ensure resolution and exclude underlying malignancy. 2. Increasing hazy opacification of the lung bases, probably layering of pleural effusions. Electronically Signed   By: Kristine Garbe M.D.   On: 03/09/2018 17:42        Scheduled Meds: . aspirin EC  81 mg Oral Daily  . atorvastatin  40 mg Oral QHS  . calcium carbonate  1 tablet Oral Daily  . ezetimibe  10 mg Oral Daily  . fluticasone furoate-vilanterol  1 puff Inhalation Daily  . ipratropium-albuterol  3 mL Nebulization TID  . metoprolol succinate  25 mg Oral Daily  . multivitamin  1 tablet Oral BID  . pantoprazole  40 mg Oral Daily  . sodium bicarbonate  650 mg Oral TID  . sodium chloride flush  3 mL Intravenous Q12H   Continuous Infusions: . sodium chloride 10 mL/hr at 03/11/18 0505  . azithromycin    . ceFEPime (MAXIPIME) IV    . vancomycin       LOS: 3 days    Time spent: 36  minutes.     Hosie Poisson, MD [facility] Pager [phone] If 7PM-7AM, please contact night-coverage [email or web address] Password [facility] 03/11/2018, 11:33 AM

## 2018-03-12 LAB — HEPATIC FUNCTION PANEL
ALT: 136 U/L — ABNORMAL HIGH (ref 0–44)
AST: 68 U/L — ABNORMAL HIGH (ref 15–41)
Albumin: 2 g/dL — ABNORMAL LOW (ref 3.5–5.0)
Alkaline Phosphatase: 435 U/L — ABNORMAL HIGH (ref 38–126)
BILIRUBIN INDIRECT: 0.7 mg/dL (ref 0.3–0.9)
Bilirubin, Direct: 0.2 mg/dL (ref 0.0–0.2)
Total Bilirubin: 0.9 mg/dL (ref 0.3–1.2)
Total Protein: 5.3 g/dL — ABNORMAL LOW (ref 6.5–8.1)

## 2018-03-12 LAB — PROCALCITONIN: Procalcitonin: 1.14 ng/mL

## 2018-03-12 MED ORDER — SIMETHICONE 80 MG PO CHEW
160.0000 mg | CHEWABLE_TABLET | Freq: Once | ORAL | Status: AC
  Administered 2018-03-12: 160 mg via ORAL
  Filled 2018-03-12: qty 2

## 2018-03-12 NOTE — Progress Notes (Signed)
Patient complaining of feeling of bloating in abdomen. Given Tums which helped slightly. Trouble sleeping d/t bloated feeling. Text paged [facility] for additional medication order. Will continue to monitor. Lajoyce Corners, RN

## 2018-03-12 NOTE — Progress Notes (Addendum)
Progress Note  Patient Name: Kelly Benjamin Date of Encounter: 03/12/2018  Primary Cardiologist: Quay Burow, MD   Subjective   No chest pain, not aware of a fib.  Having nebulizer treatment now.  Inpatient Medications    Scheduled Meds: . aspirin EC  81 mg Oral Daily  . atorvastatin  40 mg Oral QHS  . calcium carbonate  1 tablet Oral Daily  . ezetimibe  10 mg Oral Daily  . fluticasone furoate-vilanterol  1 puff Inhalation Daily  . ipratropium-albuterol  3 mL Nebulization TID  . metoprolol succinate  25 mg Oral Daily  . multivitamin  1 tablet Oral BID  . pantoprazole  40 mg Oral Daily  . sodium bicarbonate  650 mg Oral TID  . sodium chloride flush  3 mL Intravenous Q12H   Continuous Infusions: . sodium chloride 10 mL/hr at 03/12/18 0534  . azithromycin Stopped (03/11/18 1653)  . ceFEPime (MAXIPIME) IV Stopped (03/11/18 2029)  . vancomycin Stopped (03/11/18 2105)   PRN Meds: sodium chloride, acetaminophen, calcium carbonate, LORazepam, ondansetron (ZOFRAN) IV, sodium chloride flush   Vital Signs    Vitals:   03/12/18 0803 03/12/18 0809 03/12/18 0810 03/12/18 1246  BP: 94/73   97/60  Pulse: 88   (!) 102  Resp: (!) 27   (!) 27  Temp: 97.6 F (36.4 C)   97.7 F (36.5 C)  TempSrc: Oral   Oral  SpO2: 100% 100% 100% 98%  Weight:      Height:        Intake/Output Summary (Last 24 hours) at 03/12/2018 1250 Last data filed at 03/12/2018 0622 Gross per 24 hour  Intake 773.54 ml  Output 625 ml  Net 148.54 ml   Filed Weights   03/25/2018 1703 03/09/18 0601 03/11/2018 0300  Weight: 67.1 kg 68 kg 69.3 kg    Telemetry    A fib rate at 100 or a little less, improved control - Personally Reviewed  ECG    No new - Personally Reviewed  Physical Exam   GEN: No acute distress.   Neck: No JVD Cardiac: irreg irreg, no murmurs, rubs, or gallops.  Respiratory: + rhonchi and diminished breath sounds to auscultation bilaterally. GI: Soft, nontender, non-distended  MS:  No edema; No deformity. Neuro:  Nonfocal  Psych: Normal affect   Labs    Chemistry Recent Labs  Lab 03/09/18 2335 04/04/2018 0400 03/11/18 0515 03/12/18 0518  NA 134* 135 135  --   K 3.8 3.9 3.9  --   CL 101 105 103  --   CO2 18* 15* 18*  --   GLUCOSE 181* 140* 129*  --   BUN 30* 32* 41*  --   CREATININE 1.45* 1.59* 1.61*  --   CALCIUM 9.0 9.2 8.9  --   PROT  --  6.2*  --  5.3*  ALBUMIN  --  2.4*  --  2.0*  AST  --  214*  --  68*  ALT  --  172*  --  136*  ALKPHOS  --  79  --  435*  BILITOT  --  1.1  --  0.9  GFRNONAA 35* 31* 31*  --   GFRAA 40* 36* 36*  --   ANIONGAP 15 15 14   --      Hematology Recent Labs  Lab 03/09/18 2335 03/08/2018 0400 03/25/2018 2129  WBC 14.0* 16.3* 19.9*  RBC 3.36* 3.41* 3.19*  HGB 8.5* 8.5* 8.0*  HCT 27.4* 27.8* 26.1*  MCV 81.5 81.5 81.8  MCH 25.3* 24.9* 25.1*  MCHC 31.0 30.6 30.7  RDW 17.8* 17.9* 18.5*  PLT 297 320 308    Cardiac Enzymes Recent Labs  Lab 03/15/2018 1641 03/09/18 0341 03/09/18 0945 03/09/18 1557  TROPONINI 0.73* 1.08* 1.35* 1.30*    Recent Labs  Lab 03/22/2018 1008  TROPIPOC 0.36*     BNP Recent Labs  Lab 03/14/2018 0957  BNP 3,356.1*     DDimer No results for input(s): DDIMER in the last 168 hours.   Radiology    Dg Chest Port 1 View  Result Date: 03/11/2018 CLINICAL DATA:  Shortness of breath EXAM: PORTABLE CHEST 1 VIEW COMPARISON:  03/31/2018 FINDINGS: Cardiac shadow remains enlarged. Aortic calcifications are again seen. Rounded opacity is again noted in the left mid lung similar to that seen on prior plain film and CT examination. No significant improvement is noted. Improved aeration in the right lung is noted. No bony abnormality is seen. IMPRESSION: Improved aeration in the right lung base. Stable masslike infiltrate in the left upper lobe Electronically Signed   By: Inez Catalina M.D.   On: 03/11/2018 10:37   Dg Chest Port 1 View  Result Date: 04/02/2018 CLINICAL DATA:  Dyspnea. EXAM: PORTABLE CHEST  1 VIEW COMPARISON:  Radiograph of March 09, 2018. FINDINGS: Stable cardiomegaly. Atherosclerosis of thoracic aorta is noted. No pneumothorax is noted. Stable bilateral lung opacities are noted most consistent with pneumonia. Small bilateral pleural effusions are noted. Bony thorax is unremarkable. IMPRESSION: Stable bilateral lung opacities are noted consistent with pneumonia and bilateral pleural effusions. Aortic Atherosclerosis (ICD10-I70.0). Electronically Signed   By: Marijo Conception, M.D.   On: 03/07/2018 18:23    Cardiac Studies   TTE 03/06/2018 Study Conclusions  - Left ventricle: The cavity size was normal. Wall thickness was   increased in a pattern of mild LVH. Systolic function was   severely reduced. The estimated ejection fraction was in the   range of 15% to 20%. Diffuse hypokinesis. Features are consistent   with a pseudonormal left ventricular filling pattern, with   concomitant abnormal relaxation and increased filling pressure   (grade 2 diastolic dysfunction). Doppler parameters are   consistent with high ventricular filling pressure. - Aortic valve: Trileaflet; mildly thickened, mildly calcified   leaflets. - Mitral valve: There was moderate regurgitation. - Left atrium: The atrium was severely dilated. - Right ventricle: Systolic function was moderately reduced. - Right atrium: The atrium was severely dilated. - Tricuspid valve: There was moderate regurgitation. - Pulmonary arteries: Systolic pressure was mildly increased. PA   peak pressure: 41 mm Hg (S).  Impressions:  - EF has reduced from prior study (40-45%)  Echocardiogram 06/20/2016: Study Conclusions: - Left ventricle: The cavity size was normal. There was mild concentric hypertrophy. Systolic function was mildly to moderately reduced. The estimated ejection fraction was in the range of 40% to 45%. Hypokinesis of the anterior and anterolateral with akinesis of the inferolateral  myocardium. Doppler parameters are consistent with abnormal left ventricular relaxation (grade 1 diastolic dysfunction). Doppler parameters are consistent with indeterminate ventricular filling pressure. - Aortic valve: Transvalvular velocity was within the normal range. There was no stenosis. There was no regurgitation. - Mitral valve: Transvalvular velocity was within the normal range. There was no evidence for stenosis. There was mild regurgitation. - Right ventricle: Systolic function was normal. - Atrial septum: No defect or patent foramen ovale was identified by color flow Doppler. - Tricuspid valve: There was trivial regurgitation. -  Pulmonary arteries: Systolic pressure was within the normal range. PA peak pressure: 21 mm Hg (S). _______________   Patient Profile     78 y.o. female with a hx of CAD with history of NSTEMI, ischemic cardiomyopathy with most recent EF 40-45% now admitted with PNA, acute anemia with Hgb <6 and transfused , elevated troponin pk of 1.35, and now drop in EF to 15-20%.   Assessment & Plan    Acute respiratory failure followed by CCM and IM with PNA, COPD, acute combined systolic and diastolic HF.   She is not + 3080 and no weights --CXR yesterday with improved aeration in the rt lung base and stable mass like infiltrate in the Lt upper lobe.  --prn Bipap  Acute combined systolic and diastolic HF with EF 16-01% and G2DD , new drop in EF.  Diffuse hypokinesis    --holding gentle diuresis   Cardiomyopathy may be ischemic with known CAD.  New EF drop to 15-20%.  CAD with last cath 2017  With 90% RCA stenosis and 85% distal LAD stenosis has been treated medically.  Cannot do cath with recent acute anemia --NSTEMI  ASA 81 mg continues  A fib new with soft BP  Now on BB toprol XL 25 mg daily. , no CCB due to low EF.  Anticoagulation held due to acute anemia.  Acute anemia but unable to do eval due to respiratory status.    Hgb on admit 5.6  rec'd 2 U PRBCs.    HGB today 8.0   HLD on statin, continue.   Elevated LFTs with improvement   AKI Cr was 0.95 on admit and now 1.61 -diuretics and ACE/ARB on hold  Hypotension BP still 94/73 to 97/60   For questions or updates, please contact [place] Please consult [email or web address] for contact info under        Signed, Cecilie Kicks, NP  03/12/2018, 12:50 PM   ---------------------------------------------------------------------------------------------   History and all data above reviewed.  Patient examined.  I agree with the findings as above.  Kelly Benjamin feels better today, after respiratory cares. I am concerned about her drop in ejection fraction with regard to medically managed CAD previously.   Constitutional: No acute distress Cardiovascular: irregular rhythm, normal rate, no murmurs. S1 and S2 normal. Radial pulses normal bilaterally. No jugular venous distention.  Respiratory: scattered rhonchi but improved. GI : normal bowel sounds, soft and nontender. No distention.   MSK: extremities warm, well perfused. No edema.  NEURO: grossly nonfocal exam, moves all extremities. PSYCH: alert and oriented x 3, normal mood and affect.   All available labs, radiology testing, previous records reviewed. Agree with documented assessment and plan of my colleague as stated above with the following additions or changes:  Principal Problem:   PNA (pneumonia) Active Problems:   COPD (chronic obstructive pulmonary disease) (HCC)   SOB (shortness of breath)   Ischemic cardiomyopathy   Normocytic anemia   NSTEMI (non-ST elevated myocardial infarction) (HCC)   Abnormal CXR   Atrial fibrillation ([place])   Acute combined systolic and diastolic heart failure ([place])   Acute respiratory distress    Plan: Continue to hold diuresis at this time. Low dose Toprol XL added for rate control, continue as tolerated with hypotension.   This is a very challenging situation with known  CAD that has been medically managed, chronically low ejection fraction likely ischemic in nature, with an acute worsening which may be multifactorial with acute illness and possible acute ischemia. We  will continue to manage conservatively. When able we will escalate heart failure therapy. Ideally a repeat ischemia eval with cath could be performed, however with acute anemia I am concerned about the initiate of dual antiplatelet therapy.   Will continue to observe closely. Cardiology will follow.  Time Spent Directly with Patient:  I have spent a total of 35 minutes with the patient reviewing hospital notes, telemetry, EKGs, labs and examining the patient as well as establishing an assessment and plan that was discussed personally with the patient.  > 50% of time was spent in direct patient care.  Length of Stay:  LOS: 5 days   Elouise Munroe, MD [facility]

## 2018-03-12 NOTE — Progress Notes (Signed)
PROGRESS NOTE    Kelly Benjamin  OQH:[id number] DOB: November 27, 1940 DOA: 03/22/2018 PCP: Maurice Small, MD   Brief Narrative:  78 year old with history of CAD, CHF with ejection.  50%, COPD, essential hypertension came to the hospital with complaints of dyspnea on exertion and bilateral lower extremity swelling.  Patient was noted to have hemoglobin of 5.6 and elevated troponin.  On the chest x-ray there was suggestion of left upper lobe opacification.  Patient was started on treatment for community-acquired pneumonia and received blood transfusion.  With the help of cardiology patient was getting IV diuresis but given soft blood pressure, this had to be done very carefully.  Due to hypoxia patient was also placed onBiPAP.  CT of the chest initially showed dense left upper lobe airspace disease, echocardiogram showed ejection fraction 50-20% with diffuse hypokinesia.  Initially started Rocephin and azithromycin later switched to cefepime and vancomycin.   Assessment & Plan:   Principal Problem:   PNA (pneumonia) Active Problems:   COPD (chronic obstructive pulmonary disease) (HCC)   SOB (shortness of breath)   Ischemic cardiomyopathy   Normocytic anemia   NSTEMI (non-ST elevated myocardial infarction) (HCC)   Abnormal CXR   Atrial fibrillation (HCC)   Acute combined systolic and diastolic heart failure (HCC)   Acute respiratory distress  Acute hypoxic respiratory failure secondary to community-acquired pneumonia and CHF exacerbation Acute systolic congestive heart failure, class IV - Currently patient on vancomycin and cefepime.  Narrow down antibiotic to next 24-48 hours. -Check procalcitonin level. - Echocardiogram shows ejection fraction 14-20%.  Cardiology following the team.  Eventual plans for left heart catheterization once patient is medically stabilized -Advised parents to wean down oxygen.  Mild exacerbation of acute COPD - Continue bronchodilators as needed.  On p.o.  steroids  Anemia, possible GI loss -Hemoglobin stabilized for now after transfusion.  No obvious signs of bleeding at this time.  Plans for endoscopic evaluation by gastroenterology once medically stable.  Coronary artery disease secondary to ischemic cardiomyopathy -Continue aspirin and Plavix -Patient is currently chest pain-free - Cardiology following  Hyperlipidemia -Continue home meds  Chronic atrial fibrillation - Cardiology following.  Holding off on anticoagulation due to bleeding and anemia  DVT prophylaxis: SCDs Code Status: Full code Family Communication: None at bedside Disposition Plan: Pending clinical improvement  Consultants:   Cardiology  Gastroenterology  PCCM  Procedures:   None  Antimicrobials:   Azithromycin  Cefepime  Vancomycin   Subjective: Patient reports of generalized weakness.  Her heart rate remains still elevated.  Overnight she required oxygen.  This morning she is on high flow nasal cannula saturating 100% therefore I have asked nurse to wean this off.  Review of Systems Otherwise negative except as per HPI, including: General: Denies fever, chills, night sweats or unintended weight loss. Resp: Denies cough, wheezing, shortness of breath. Cardiac: Denies chest pain, palpitations, orthopnea, paroxysmal nocturnal dyspnea. GI: Denies abdominal pain, nausea, vomiting, diarrhea or constipation GU: Denies dysuria, frequency, hesitancy or incontinence MS: Denies muscle aches, joint pain or swelling Neuro: Denies headache, neurologic deficits (focal weakness, numbness, tingling), abnormal gait Psych: Denies anxiety, depression, SI/HI/AVH Skin: Denies new rashes or lesions ID: Denies sick contacts, exotic exposures, travel  Objective: Vitals:   03/12/18 0809 03/12/18 0810 03/12/18 1246 03/12/18 1335  BP:   97/60   Pulse:   (!) 102   Resp:   (!) 27   Temp:   97.7 F (36.5 C)   TempSrc:   Oral   SpO2: 100%  100% 98% 100%  Weight:       Height:        Intake/Output Summary (Last 24 hours) at 03/12/2018 1443 Last data filed at 03/12/2018 0622 Gross per 24 hour  Intake 773.54 ml  Output 625 ml  Net 148.54 ml   Filed Weights   03/21/2018 1703 03/09/18 0601 03/18/2018 0300  Weight: 67.1 kg 68 kg 69.3 kg    Examination:  General exam: Appears calm and comfortable  Respiratory system: Bibasilar crackles Cardiovascular system: Irregularly irregular rhythm, no JVD, murmurs, rubs, gallops or clicks. No pedal edema. Gastrointestinal system: Abdomen is nondistended, soft and nontender. No organomegaly or masses felt. Normal bowel sounds heard. Central nervous system: Alert and oriented. No focal neurological deficits. Extremities: Symmetric 5 x 5 power. Skin: No rashes, lesions or ulcers Psychiatry: Judgement and insight appear normal. Mood & affect appropriate.     Data Reviewed:   CBC: Recent Labs  Lab 04/04/2018 1243 03/09/18 0341 03/09/18 1557 03/09/18 2335 04/01/2018 0400 03/05/2018 2129  WBC 10.1 14.4*  --  14.0* 16.3* 19.9*  NEUTROABS  --  12.9*  --   --   --   --   HGB 5.6* 8.3* 8.8* 8.5* 8.5* 8.0*  HCT 20.0* 27.2* 29.0* 27.4* 27.8* 26.1*  MCV 81.6 82.7  --  81.5 81.5 81.8  PLT 335 359  --  297 320 993   Basic Metabolic Panel: Recent Labs  Lab 04/03/2018 0957 03/09/18 0341 03/09/18 2335 03/28/2018 0400 03/11/18 0515  NA 137 135 134* 135 135  K 3.6 3.8 3.8 3.9 3.9  CL 105 104 101 105 103  CO2 22 21* 18* 15* 18*  GLUCOSE 127* 144* 181* 140* 129*  BUN 17 20 30* 32* 41*  CREATININE 0.95 1.16* 1.45* 1.59* 1.61*  CALCIUM 9.2 8.9 9.0 9.2 8.9   GFR: Estimated Creatinine Clearance: 32.1 mL/min (A) (by C-G formula based on SCr of 1.61 mg/dL (H)). Liver Function Tests: Recent Labs  Lab 03/15/2018 0400 03/12/18 0518  AST 214* 68*  ALT 172* 136*  ALKPHOS 79 435*  BILITOT 1.1 0.9  PROT 6.2* 5.3*  ALBUMIN 2.4* 2.0*   No results for input(s): LIPASE, AMYLASE in the last 168 hours. No results for  input(s): AMMONIA in the last 168 hours. Coagulation Profile: No results for input(s): INR, PROTIME in the last 168 hours. Cardiac Enzymes: Recent Labs  Lab 03/05/2018 1641 03/09/18 0341 03/09/18 0945 03/09/18 1557  TROPONINI 0.73* 1.08* 1.35* 1.30*   BNP (last 3 results) No results for input(s): PROBNP in the last 8760 hours. HbA1C: No results for input(s): HGBA1C in the last 72 hours. CBG: Recent Labs  Lab 03/09/18 1437  GLUCAP 158*   Lipid Profile: No results for input(s): CHOL, HDL, LDLCALC, TRIG, CHOLHDL, LDLDIRECT in the last 72 hours. Thyroid Function Tests: No results for input(s): TSH, T4TOTAL, FREET4, T3FREE, THYROIDAB in the last 72 hours. Anemia Panel: No results for input(s): VITAMINB12, FOLATE, FERRITIN, TIBC, IRON, RETICCTPCT in the last 72 hours. Sepsis Labs: Recent Labs  Lab 03/21/2018 0400 03/22/2018 1906 03/25/2018 2129 03/11/18 0515 03/12/18 0518  PROCALCITON  --   --   --   --  1.14  LATICACIDVEN 2.2* 5.6* 2.7* 1.4  --     Recent Results (from the past 240 hour(s))  Culture, blood (routine x 2) Call MD if unable to obtain prior to antibiotics being given     Status: None (Preliminary result)   Collection Time: 03/24/2018  4:14 PM  Result Value  Ref Range Status   Specimen Description BLOOD RIGHT ARM  Final   Special Requests   Final    BOTTLES DRAWN AEROBIC AND ANAEROBIC Blood Culture results may not be optimal due to an inadequate volume of blood received in culture bottles   Culture   Final    NO GROWTH 4 DAYS Performed at [facility], [place] [address], [place], [place] [address]    Report Status PENDING  Incomplete  Culture, blood (routine x 2) Call MD if unable to obtain prior to antibiotics being given     Status: None (Preliminary result)   Collection Time: 03/28/2018  4:21 PM  Result Value Ref Range Status   Specimen Description BLOOD RIGHT HAND  Final   Special Requests   Final    AEROBIC BOTTLE ONLY Blood Culture results may not be  optimal due to an inadequate volume of blood received in culture bottles   Culture   Final    NO GROWTH 4 DAYS Performed at [facility], [place] [address], [place], [place] [address]    Report Status PENDING  Incomplete         Radiology Studies: Dg Chest Port 1 View  Result Date: 03/11/2018 CLINICAL DATA:  Shortness of breath EXAM: PORTABLE CHEST 1 VIEW COMPARISON:  03/13/2018 FINDINGS: Cardiac shadow remains enlarged. Aortic calcifications are again seen. Rounded opacity is again noted in the left mid lung similar to that seen on prior plain film and CT examination. No significant improvement is noted. Improved aeration in the right lung is noted. No bony abnormality is seen. IMPRESSION: Improved aeration in the right lung base. Stable masslike infiltrate in the left upper lobe Electronically Signed   By: Inez Catalina M.D.   On: 03/11/2018 10:37   Dg Chest Port 1 View  Result Date: 03/15/2018 CLINICAL DATA:  Dyspnea. EXAM: PORTABLE CHEST 1 VIEW COMPARISON:  Radiograph of March 09, 2018. FINDINGS: Stable cardiomegaly. Atherosclerosis of thoracic aorta is noted. No pneumothorax is noted. Stable bilateral lung opacities are noted most consistent with pneumonia. Small bilateral pleural effusions are noted. Bony thorax is unremarkable. IMPRESSION: Stable bilateral lung opacities are noted consistent with pneumonia and bilateral pleural effusions. Aortic Atherosclerosis (ICD10-I70.0). Electronically Signed   By: Marijo Conception, M.D.   On: 03/19/2018 18:23        Scheduled Meds: . aspirin EC  81 mg Oral Daily  . atorvastatin  40 mg Oral QHS  . calcium carbonate  1 tablet Oral Daily  . ezetimibe  10 mg Oral Daily  . fluticasone furoate-vilanterol  1 puff Inhalation Daily  . ipratropium-albuterol  3 mL Nebulization TID  . metoprolol succinate  25 mg Oral Daily  . multivitamin  1 tablet Oral BID  . pantoprazole  40 mg Oral Daily  . sodium bicarbonate  650 mg Oral TID  . sodium  chloride flush  3 mL Intravenous Q12H   Continuous Infusions: . sodium chloride 10 mL/hr at 03/12/18 0534  . azithromycin Stopped (03/11/18 1653)  . ceFEPime (MAXIPIME) IV Stopped (03/11/18 2029)  . vancomycin Stopped (03/11/18 2105)     LOS: 4 days   Time spent= 35 mins    Liela Rylee Arsenio Loader, MD [facility]  If 7PM-7AM, please contact night-coverage [email or web address] 03/12/2018, 2:43 PM

## 2018-03-12 NOTE — Progress Notes (Addendum)
NAME:  Kelly Benjamin, MRN:  [id number], DOB:  June 21, 1940, LOS: 4 ADMISSION DATE:  03/30/2018, CONSULTATION DATE:  1/6 REFERRING MD:  Dr. Karleen Hampshire, CHIEF COMPLAINT:  Dyspnea, hypotension   Brief History   78 year old female admitted for CAP. Course became complicated by worsening dyspnea and hypotension requiring BiPAP and volume resuscitation, but EF if 15%.   History of present illness   78 year old female with PMH as below, which is significant for CAD, CHF, COPD, and HTN. She presented to [facility] ED 1/4 with complaints of dyspnea on exertion for several days accompanied by leg swelling, but she denied chest pain at that time. She was found to have hemoglobin of 5.6, positive troponin, and LUL opacification on CXR. She was admitted to the hospitalists for treatment of CAP and blood transfusions. Cardiology had been consulted and felt as though she was volume overloaded. She was treated with IV diuresis and underwhelming response. Her troponin was elevated and peaked at 1.35 and has trended down. No plans for cardiac intervention at this time. GI following with no plans for endoscopic workup until contidion optimized. Then 1/6 her course became complicated by worsening dyspnea and intermittent hypotension. Her BP somewhat responded to volume, and she was started on BiPAP, which she tolerated pretty well. Lactic acid rose to 5.6 and her antibiotics were broadened for fear of sepsis.   Past Medical History   has a past medical history of Breast cancer ([place]) (09/2017), CAD in native artery (02/28/2016), COPD (chronic obstructive pulmonary disease) ([place]), Dyslipidemia, goal LDL below 70, Hypertension, Ischemic cardiomyopathy, and Personal history of radiation therapy.  Significant Hospital Events   1/4 admit 1/6 hypotension, dyspnea, started on BiPAP.  Resolved on 03/11/2017 03/12/2018 Requires noninvasive mechanical ventilatory support, hemodynamically stable.  Pulmonary critical care will be  available PRN.  Consults:  Cardiology Gastroenerololy Pulmonary 1/6  Procedures:    Significant Diagnostic Tests:  CT chest 1/4 > Dense left upper lobe airspace disease is presumably pneumonia. Recommend plain film follow-up after appropriate therapy to exclude underlying neoplasm. Small left pleural effusion noted. Mild interlobular septal thickening suggestive of interstitial edema. Echo 1/6 > LVEF 15-20%, Diffuse hypokinesis. Grade 2 diastolic dysfunction. Mod MR, RV systolic function mod reduced. Mod TR. PA peak pressure at 41 mmHg.  03/11/2018 chest x-ray shows increased variation right lower lobe, no significant change left lower lobe mass. Micro Data:  Blood 1/4 > 03/12/2018 no growth to date>> Sputum 1/4 not done>  Antimicrobials:  Ceftriaxone 1/4 > 1/6 Azithromycin 1/4 > 1/6 Cefepime 1/6 > Vancomycin 1/6 >  Interim history/subjective:  Reports feeling better.  No acute distress on nasal cannula.  No chest x-ray available today.  Objective   Blood pressure 94/73, pulse 88, temperature 97.6 F (36.4 C), temperature source Oral, resp. rate (!) 27, height 5\' 10"  (1.778 m), weight 69.3 kg, SpO2 100 %.        Intake/Output Summary (Last 24 hours) at 03/12/2018 0912 Last data filed at 03/12/2018 7948 Gross per 24 hour  Intake 913.54 ml  Output 625 ml  Net 288.54 ml   Filed Weights   03/27/2018 1703 03/09/18 0601 03/08/2018 0300  Weight: 67.1 kg 68 kg 69.3 kg    Examination: General: Frail elderly female no acute distress HEENT: JVD or lymphadenopathy is appreciated Neuro: Awake alert follows commands CV: Heart sounds are irregular, A. fib controlled ventricular rate PULM: Creased breath sounds throughout AX:KPVV, non-tender, bsx4 active, complains of bloating Extremities: warm/dry, 1+  edema  Skin: no rashes or lesions    Resolved Hospital Problem list     Assessment & Plan:   Acute hypoxemic respiratory failure, which is multifactorial secondary to community  acquired pneumonia and CHF exacerbation.  -She is not requiring BiPAP, she is currently down to 4 L nasal cannula with O2 saturations listed 98 -100%.  Continue to wean FiO2 for saturations greater than 94%   CAP -03/12/2018 currently on Zithromax, Maxipime, vancomycin without positive culture data.  Consideration to narrow antibiotics in the near future -Culture data is negative at this time.  Unable to get sputum culture.  Acute exacerbation of HFrEF:  EF 15-20% on echo. This is a sharp decline from her last echo (40-45%).  Atrial fibrillation. Cardiology is involved Currently holding gentle diuresis Resuscitation has been completed  COPD +/- acute exacerbation -Continue bronchodilators as needed -Short course of p.o. steroids started on 03/26/2018, will complete.  Hypotension improved s/p 2.5L IVF resuscitation today. Lactic acid increased to 5.6, but has since cleared to 2.7. Suspect lactic acidosis is multifactorial due to hypotension and work of breathing. MAP on my evaluation is 65mmHg.  -Status post fluid resuscitation.  Hemodynamically stable 03/12/2018 -Lactic acid is cleared currently 1.7 -Hypotension resolved on 03/12/2018  Anemia Recent Labs    03/22/2018 0400 03/22/2018 2129  HGB 8.5* 8.0*  Questionable GI source Plan Per GI services.  Global:  03/12/2018 remains hemodynamically stable.  Chest x-ray on 03/11/2018 shows improvement in pneumonia with no significant change in left lower lobe mass.  He is not requiring noninvasive mechanical ventilatory support at this time.  Pulmonary critical care will be available as needed.    Best practice:  Diet: Advance diet Pain/Anxiety/Delirium protocol (if indicated): Na VAP protocol (if indicated): Na DVT prophylaxis: per primary GI prophylaxis: per primary, with GI input Glucose control: per primary Mobility: Out of bed as tolerated Code Status: Full Family Communication: 03/12/2018 patient updated at bedside Disposition:  SDU/PCU  Labs   CBC: Recent Labs  Lab 04/03/2018 1243 03/09/18 0341 03/09/18 1557 03/09/18 2335 03/13/2018 0400 03/27/2018 2129  WBC 10.1 14.4*  --  14.0* 16.3* 19.9*  NEUTROABS  --  12.9*  --   --   --   --   HGB 5.6* 8.3* 8.8* 8.5* 8.5* 8.0*  HCT 20.0* 27.2* 29.0* 27.4* 27.8* 26.1*  MCV 81.6 82.7  --  81.5 81.5 81.8  PLT 335 359  --  297 320 950    Basic Metabolic Panel: Recent Labs  Lab 03/18/2018 0957 03/09/18 0341 03/09/18 2335 03/26/2018 0400 03/11/18 0515  NA 137 135 134* 135 135  K 3.6 3.8 3.8 3.9 3.9  CL 105 104 101 105 103  CO2 22 21* 18* 15* 18*  GLUCOSE 127* 144* 181* 140* 129*  BUN 17 20 30* 32* 41*  CREATININE 0.95 1.16* 1.45* 1.59* 1.61*  CALCIUM 9.2 8.9 9.0 9.2 8.9   GFR: Estimated Creatinine Clearance: 32.1 mL/min (A) (by C-G formula based on SCr of 1.61 mg/dL (H)). Recent Labs  Lab 03/09/18 0341  03/09/18 2335 03/17/2018 0400 03/28/2018 1906 03/16/2018 2129 03/11/18 0515  WBC 14.4*  --  14.0* 16.3*  --  19.9*  --   LATICACIDVEN  --    < > 3.3* 2.2* 5.6* 2.7* 1.4   < > = values in this interval not displayed.    Liver Function Tests: Recent Labs  Lab 03/24/2018 0400 03/12/18 0518  AST 214* 68*  ALT 172* 136*  ALKPHOS 79 435*  BILITOT 1.1 0.9  PROT 6.2* 5.3*  ALBUMIN 2.4* 2.0*   No results for input(s): LIPASE, AMYLASE in the last 168 hours. No results for input(s): AMMONIA in the last 168 hours.  ABG    Component Value Date/Time   PHART 7.487 (H) 03/11/2018 0440   PCO2ART 25.5 (L) 03/11/2018 0440   PO2ART 139 (H) 03/11/2018 0440   HCO3 19.2 (L) 03/11/2018 0440   TCO2 19 (L) 03/09/2018 1631   ACIDBASEDEF 3.7 (H) 03/11/2018 0440   O2SAT 98.8 03/11/2018 0440     Coagulation Profile: No results for input(s): INR, PROTIME in the last 168 hours.  Cardiac Enzymes: Recent Labs  Lab 03/27/2018 1641 03/09/18 0341 03/09/18 0945 03/09/18 1557  TROPONINI 0.73* 1.08* 1.35* 1.30*    HbA1C: Hgb A1c MFr Bld  Date/Time Value Ref Range Status   02/29/2016 05:01 AM 5.4 4.8 - 5.6 % Final    Comment:    (NOTE)         Pre-diabetes: 5.7 - 6.4         Diabetes: >6.4         Glycemic control for adults with diabetes: <7.0     CBG: Recent Labs  Lab 03/09/18 [place] Minor ACNP Maryanna Shape PCCM Pager [phone] till 1 pm If no answer page [phone] 03/12/2018, 9:12 AM  Attending note: I have seen and examined the patient. History, labs and imaging reviewed.  States breathing is better.  Continues to have some cough  Blood pressure 94/73, pulse 88, temperature 97.6 F (36.4 C), temperature source Oral, resp. rate (!) 27, height 5\' 10"  (1.778 m), weight 69.3 kg, SpO2 100 %. Gen:   Frail, elderly HEENT:  EOMI, sclera anicteric Neck:     No masses; no thyromegaly Lungs:    Clear to auscultation bilaterally; normal respiratory effort CV:         Bilateral rhonchi Abd:      + bowel sounds; soft, non-tender; no palpable masses, no distension Ext:    No edema; adequate peripheral perfusion Skin:      Warm and dry; no rash Neuro: alert and oriented x 3 Psych: normal mood and affect   Imaging CT chest 04/02/2018- dense left upper lobe consolidation, mild interlobular septal thickening, coronary artery calcification  Chest x-ray 03/11/2018- proved aeration in the right lung base, stable masslike infiltrate in the left upper lobe. I have reviewed the images personally.   Assessment/plan: 78 year old with sepsis, acute respiratory failure in setting of left upper lobe community-acquired pneumonia Improved hemodynamics and oxygen requirements  Continue antibiotics per primary team She will need follow-up chest imaging after discharge in 6 to 8 weeks to ensure resolution of  Infiltrate. Can be done at primary care Continue to wean down oxygen PCCM will be available as needed.  Please call with any questions.  Marshell Garfinkel MD [facility] 03/12/2018, 12:35 PM

## 2018-03-13 DIAGNOSIS — E871 Hypo-osmolality and hyponatremia: Secondary | ICD-10-CM

## 2018-03-13 LAB — CULTURE, BLOOD (ROUTINE X 2)
Culture: NO GROWTH
Culture: NO GROWTH

## 2018-03-13 LAB — BASIC METABOLIC PANEL
Anion gap: 9 (ref 5–15)
BUN: 53 mg/dL — ABNORMAL HIGH (ref 8–23)
CO2: 20 mmol/L — ABNORMAL LOW (ref 22–32)
Calcium: 8.3 mg/dL — ABNORMAL LOW (ref 8.9–10.3)
Chloride: 99 mmol/L (ref 98–111)
Creatinine, Ser: 1.57 mg/dL — ABNORMAL HIGH (ref 0.44–1.00)
GFR calc Af Amer: 37 mL/min — ABNORMAL LOW (ref >60)
GFR calc non Af Amer: 32 mL/min — ABNORMAL LOW (ref >60)
GLUCOSE: 122 mg/dL — AB (ref 70–99)
Potassium: 3.9 mmol/L (ref 3.5–5.1)
Sodium: 128 mmol/L — ABNORMAL LOW (ref 135–145)

## 2018-03-13 LAB — MAGNESIUM: Magnesium: 2.2 mg/dL (ref 1.7–2.4)

## 2018-03-13 LAB — CBC
HCT: 25.8 % — ABNORMAL LOW (ref 36.0–46.0)
Hemoglobin: 8 g/dL — ABNORMAL LOW (ref 12.0–15.0)
MCH: 25.7 pg — ABNORMAL LOW (ref 26.0–34.0)
MCHC: 31 g/dL (ref 30.0–36.0)
MCV: 83 fL (ref 80.0–100.0)
Platelets: 277 10*3/uL (ref 150–400)
RBC: 3.11 MIL/uL — ABNORMAL LOW (ref 3.87–5.11)
RDW: 20.1 % — AB (ref 11.5–15.5)
WBC: 14.8 10*3/uL — ABNORMAL HIGH (ref 4.0–10.5)
nRBC: 0.7 % — ABNORMAL HIGH (ref 0.0–0.2)

## 2018-03-13 NOTE — Plan of Care (Signed)

## 2018-03-13 NOTE — Progress Notes (Signed)
Pharmacy Antibiotic Note  Kelly Benjamin is a 78 y.o. female admitted on 03/13/2018 with pneumonia.  Pharmacy has been consulted for vancomycin/cefepime dosing (azithromycin d/c on 1/8). Plans noted for possible de-escalation -WBC= 14.8, SCr= 1.57 CrCL ~ 30, cultures negative   Plan: -No vancomycin changes needed -Will consider a vancomycin trough if vancomycin continues -Continue cefepime 2gm IV q24hr -Will follow renal function  clinical progress   Height: 5\' 10"  (177.8 cm) Weight: 152 lb 12.5 oz (69.3 kg) IBW/kg (Calculated) : 68.5  Temp (24hrs), Avg:97.8 F (36.6 C), Min:97.5 F (36.4 C), Max:98.3 F (36.8 C)  Recent Labs  Lab 03/09/18 0341  03/09/18 2335 03/06/2018 0400 03/13/2018 1906 03/22/2018 2129 03/11/18 0515 03/13/18 0243  WBC 14.4*  --  14.0* 16.3*  --  19.9*  --  14.8*  CREATININE 1.16*  --  1.45* 1.59*  --   --  1.61* 1.57*  LATICACIDVEN  --    < > 3.3* 2.2* 5.6* 2.7* 1.4  --    < > = values in this interval not displayed.    Estimated Creatinine Clearance: 33 mL/min (A) (by C-G formula based on SCr of 1.57 mg/dL (H)).    Allergies  Allergen Reactions  . Sulfa Antibiotics Swelling    Antimicrobials this admission: Ceftriaxone/azithromycin 1/4 >> 1/6 Vancomycin 1/6 >> Cefepime 1/6>>   Dose adjustments this admission: N/A  Microbiology results: 1/4 BCx: Neg   Thank you for allowing pharmacy to be a part of this patient's care.  Hildred Laser, PharmD Clinical Pharmacist **Pharmacist phone directory can now be found on [email or web address] ([email or web address]).  Listed under [place].

## 2018-03-13 NOTE — Progress Notes (Signed)
PROGRESS NOTE    Kelly Benjamin  RXV:[id number] DOB: Jun 13, 1940 DOA: 03/27/2018 PCP: Maurice Small, MD   Brief Narrative:  78 year old with history of CAD, CHF with ejection.  50%, COPD, essential hypertension came to the hospital with complaints of dyspnea on exertion and bilateral lower extremity swelling.  Patient was noted to have hemoglobin of 5.6 and elevated troponin.  On the chest x-ray there was suggestion of left upper lobe opacification.  Patient was started on treatment for community-acquired pneumonia and received blood transfusion.  With the help of cardiology patient was getting IV diuresis but given soft blood pressure, this had to be done very carefully.  Due to hypoxia patient was also placed onBiPAP.  CT of the chest initially showed dense left upper lobe airspace disease, echocardiogram showed ejection fraction 50-20% with diffuse hypokinesia.  Initially started Rocephin and azithromycin later switched to cefepime and vancomycin.   Assessment & Plan:   Principal Problem:   PNA (pneumonia) Active Problems:   COPD (chronic obstructive pulmonary disease) (HCC)   SOB (shortness of breath)   Ischemic cardiomyopathy   Normocytic anemia   NSTEMI (non-ST elevated myocardial infarction) (HCC)   Abnormal CXR   Atrial fibrillation (HCC)   Acute combined systolic and diastolic heart failure (HCC)   Acute respiratory distress  Acute hypoxic respiratory failure secondary to community-acquired pneumonia and CHF exacerbation Acute systolic congestive heart failure, class II - Cont Vanc and Cefepime.  -ProCal- slightly elevated.  - Echocardiogram shows ejection fraction 15-20%.  Cardiology following the team.  Eventual plans for left heart catheterization once patient is medically stabilized -Oxygen down to 2L [place] for now. Hold diuretics due to soft BP today.   Mild exacerbation of acute COPD -Cont Bronchodilators and PO Steroids.   Anemia, possible GI loss -Hemoglobin stabilized  for now after transfusion.  No obvious signs of bleeding at this time.  Plans for endoscopic evaluation by gastroenterology once medically stable.  Transaminitis  - LFTs trending down, ALP slightly up.  We will continue to monitor this.  Total bilirubin is normal.  Hyponatremia - Closely monitor sodium levels. Possible due to hypervolemia?  Coronary artery disease secondary to ischemic cardiomyopathy -On ASA and Plavix.  -Patient is currently chest pain-free - Cardiology following- will likely need LHC when stable.   Hyperlipidemia -Continue home meds  Chronic atrial fibrillation - Cardiology following. Hold off on AC.   DVT prophylaxis: SCDs Code Status: Full code Family Communication: None at bedside Disposition Plan: Pending clinical improvement  Consultants:   Cardiology  Gastroenterology  PCCM  Procedures:   None  Antimicrobials:   Azithromycin  Cefepime  Vancomycin   Subjective: Feels a little better in terms of breathing but overall feels very weak.  Review of Systems Otherwise negative except as per HPI, including: General = no fevers, chills, dizziness, malaise, fatigue HEENT/EYES = negative for pain, redness, loss of vision, double vision, blurred vision, loss of hearing, sore throat, hoarseness, dysphagia Cardiovascular= negative for chest pain, palpitation, murmurs, lower extremity swelling Respiratory/lungs= negative for shortness of breath, cough, hemoptysis, wheezing, mucus production Gastrointestinal= negative for nausea, vomiting,, abdominal pain, melena, hematemesis Genitourinary= negative for Dysuria, Hematuria, Change in Urinary Frequency MSK = Negative for arthralgia, myalgias, Back Pain, Joint swelling  Neurology= Negative for headache, seizures, numbness, tingling  Psychiatry= Negative for anxiety, depression, suicidal and homocidal ideation Allergy/Immunology= Medication/Food allergy as listed  Skin= Negative for Rash, lesions,  ulcers, itching   Objective: Vitals:   03/13/18 5093 03/13/18 0742 03/13/18 0750  03/13/18 1322  BP: (!) 97/56     Pulse:      Resp: (!) 27     Temp: (!) 97.5 F (36.4 C)     TempSrc: Oral     SpO2: 97% 98% 100% 97%  Weight:      Height:        Intake/Output Summary (Last 24 hours) at 03/13/2018 1328 Last data filed at 03/13/2018 0032 Gross per 24 hour  Intake 465.46 ml  Output -  Net 465.46 ml   Filed Weights   04/02/2018 1703 03/09/18 0601 03/29/2018 0300  Weight: 67.1 kg 68 kg 69.3 kg    Examination: Constitutional: NAD, calm, comfortable, on 2 L nasal cannula Eyes: PERRL, lids and conjunctivae normal ENMT: Mucous membranes are moist. Posterior pharynx clear of any exudate or lesions.Normal dentition.  Neck: normal, supple, no masses, no thyromegaly Respiratory: Bibasilar crackles Cardiovascular: Regular rate and rhythm, no murmurs / rubs / gallops. No extremity edema. 2+ pedal pulses. No carotid bruits.  Abdomen: no tenderness, no masses palpated. No hepatosplenomegaly. Bowel sounds positive.  Musculoskeletal: no clubbing / cyanosis. No joint deformity upper and lower extremities. Good ROM, no contractures. Normal muscle tone.  Skin: no rashes, lesions, ulcers. No induration Neurologic: CN 2-12 grossly intact. Sensation intact, DTR normal. Strength 4/5 in all 4.  Psychiatric: Normal judgment and insight. Alert and oriented x 3. Normal mood.    Data Reviewed:   CBC: Recent Labs  Lab 03/09/18 0341 03/09/18 1557 03/09/18 2335 03/30/2018 0400 03/16/2018 2129 03/13/18 0243  WBC 14.4*  --  14.0* 16.3* 19.9* 14.8*  NEUTROABS 12.9*  --   --   --   --   --   HGB 8.3* 8.8* 8.5* 8.5* 8.0* 8.0*  HCT 27.2* 29.0* 27.4* 27.8* 26.1* 25.8*  MCV 82.7  --  81.5 81.5 81.8 83.0  PLT 359  --  297 320 308 253   Basic Metabolic Panel: Recent Labs  Lab 03/09/18 0341 03/09/18 2335 03/09/2018 0400 03/11/18 0515 03/13/18 0243  NA 135 134* 135 135 128*  K 3.8 3.8 3.9 3.9 3.9  CL 104  101 105 103 99  CO2 21* 18* 15* 18* 20*  GLUCOSE 144* 181* 140* 129* 122*  BUN 20 30* 32* 41* 53*  CREATININE 1.16* 1.45* 1.59* 1.61* 1.57*  CALCIUM 8.9 9.0 9.2 8.9 8.3*  MG  --   --   --   --  2.2   GFR: Estimated Creatinine Clearance: 33 mL/min (A) (by C-G formula based on SCr of 1.57 mg/dL (H)). Liver Function Tests: Recent Labs  Lab 03/09/2018 0400 03/12/18 0518  AST 214* 68*  ALT 172* 136*  ALKPHOS 79 435*  BILITOT 1.1 0.9  PROT 6.2* 5.3*  ALBUMIN 2.4* 2.0*   No results for input(s): LIPASE, AMYLASE in the last 168 hours. No results for input(s): AMMONIA in the last 168 hours. Coagulation Profile: No results for input(s): INR, PROTIME in the last 168 hours. Cardiac Enzymes: Recent Labs  Lab 03/09/2018 1641 03/09/18 0341 03/09/18 0945 03/09/18 1557  TROPONINI 0.73* 1.08* 1.35* 1.30*   BNP (last 3 results) No results for input(s): PROBNP in the last 8760 hours. HbA1C: No results for input(s): HGBA1C in the last 72 hours. CBG: Recent Labs  Lab 03/09/18 1437  GLUCAP 158*   Lipid Profile: No results for input(s): CHOL, HDL, LDLCALC, TRIG, CHOLHDL, LDLDIRECT in the last 72 hours. Thyroid Function Tests: No results for input(s): TSH, T4TOTAL, FREET4, T3FREE, THYROIDAB in the last 72  hours. Anemia Panel: No results for input(s): VITAMINB12, FOLATE, FERRITIN, TIBC, IRON, RETICCTPCT in the last 72 hours. Sepsis Labs: Recent Labs  Lab 03/17/2018 0400 03/17/2018 1906 03/14/2018 2129 03/11/18 0515 03/12/18 0518  PROCALCITON  --   --   --   --  1.14  LATICACIDVEN 2.2* 5.6* 2.7* 1.4  --     Recent Results (from the past 240 hour(s))  Culture, blood (routine x 2) Call MD if unable to obtain prior to antibiotics being given     Status: None   Collection Time: 03/25/2018  4:14 PM  Result Value Ref Range Status   Specimen Description BLOOD RIGHT ARM  Final   Special Requests   Final    BOTTLES DRAWN AEROBIC AND ANAEROBIC Blood Culture results may not be optimal due to an  inadequate volume of blood received in culture bottles   Culture   Final    NO GROWTH 5 DAYS Performed at [facility], [place] [address] [place], [place] [address]    Report Status 03/13/2018 FINAL  Final  Culture, blood (routine x 2) Call MD if unable to obtain prior to antibiotics being given     Status: None   Collection Time: 03/31/2018  4:21 PM  Result Value Ref Range Status   Specimen Description BLOOD RIGHT HAND  Final   Special Requests   Final    AEROBIC BOTTLE ONLY Blood Culture results may not be optimal due to an inadequate volume of blood received in culture bottles   Culture   Final    NO GROWTH 5 DAYS Performed at [facility], [place] [address] [place],  [address]    Report Status 03/13/2018 FINAL  Final         Radiology Studies: No results found.      Scheduled Meds: . aspirin EC  81 mg Oral Daily  . atorvastatin  40 mg Oral QHS  . calcium carbonate  1 tablet Oral Daily  . ezetimibe  10 mg Oral Daily  . fluticasone furoate-vilanterol  1 puff Inhalation Daily  . ipratropium-albuterol  3 mL Nebulization TID  . metoprolol succinate  25 mg Oral Daily  . multivitamin  1 tablet Oral BID  . pantoprazole  40 mg Oral Daily  . sodium bicarbonate  650 mg Oral TID  . sodium chloride flush  3 mL Intravenous Q12H   Continuous Infusions: . sodium chloride 10 mL/hr at 03/12/18 0534  . ceFEPime (MAXIPIME) IV 2 g (03/12/18 2133)  . vancomycin 1,000 mg (03/12/18 2138)     LOS: 5 days   Time spent= 35 mins    Eward Rutigliano Arsenio Loader, MD [facility]  If 7PM-7AM, please contact night-coverage [email or web address] 03/13/2018, 1:28 PM

## 2018-03-13 NOTE — Progress Notes (Addendum)
Progress Note  Patient Name: Kelly Benjamin Date of Encounter: 03/13/2018  Primary Cardiologist: Quay Burow, MD   Subjective   No significant overnight events. Patient states she feels better than she did earlier this week. Continues to have productive cough and shortness of breath but she feels like breathing is improving. No chest pain.  Inpatient Medications    Scheduled Meds: . aspirin EC  81 mg Oral Daily  . atorvastatin  40 mg Oral QHS  . calcium carbonate  1 tablet Oral Daily  . ezetimibe  10 mg Oral Daily  . fluticasone furoate-vilanterol  1 puff Inhalation Daily  . ipratropium-albuterol  3 mL Nebulization TID  . metoprolol succinate  25 mg Oral Daily  . multivitamin  1 tablet Oral BID  . pantoprazole  40 mg Oral Daily  . sodium bicarbonate  650 mg Oral TID  . sodium chloride flush  3 mL Intravenous Q12H   Continuous Infusions: . sodium chloride 10 mL/hr at 03/12/18 0534  . ceFEPime (MAXIPIME) IV 2 g (03/12/18 2133)  . vancomycin 1,000 mg (03/12/18 2138)   PRN Meds: sodium chloride, acetaminophen, calcium carbonate, LORazepam, ondansetron (ZOFRAN) IV, sodium chloride flush   Vital Signs    Vitals:   03/13/18 0200 03/13/18 0635 03/13/18 0742 03/13/18 0750  BP:  (!) 97/56    Pulse: 97     Resp: (!) 24 (!) 27    Temp:  (!) 97.5 F (36.4 C)    TempSrc:  Oral    SpO2: 96% 97% 98% 100%  Weight:      Height:        Intake/Output Summary (Last 24 hours) at 03/13/2018 0928 Last data filed at 03/13/2018 0032 Gross per 24 hour  Intake 465.46 ml  Output -  Net 465.46 ml   Filed Weights   04/04/2018 1703 03/09/18 0601 03/09/2018 0300  Weight: 67.1 kg 68 kg 69.3 kg    Telemetry    Atrial fibrillation with ventricular rates in the 90's to 110's. - Personally Reviewed  Physical Exam   GEN: Elderly Caucasian female resting comfortably in no acute distress. Currently on supplemental O2 via nasal cannula.  Neck: Supple. Cardiac: Tachycardic with irregularly  irregular rhythm. No murmurs, rubs, or gallops.  Respiratory: Tachypneic with mild increased work of breathing. Course crackles in bilateral bases with scattered wheezes. Improving some. GI: Abdomen soft, non-distended, and non-tender to palpation. Bowel sounds present. MS: No pedal edema. SCDs in place bilaterally.  Skin: Warm and dry. Neuro:  No focal deficits. Psych: Normal affect.  Labs    Chemistry Recent Labs  Lab 03/28/2018 0400 03/11/18 0515 03/12/18 0518 03/13/18 0243  NA 135 135  --  128*  K 3.9 3.9  --  3.9  CL 105 103  --  99  CO2 15* 18*  --  20*  GLUCOSE 140* 129*  --  122*  BUN 32* 41*  --  53*  CREATININE 1.59* 1.61*  --  1.57*  CALCIUM 9.2 8.9  --  8.3*  PROT 6.2*  --  5.3*  --   ALBUMIN 2.4*  --  2.0*  --   AST 214*  --  68*  --   ALT 172*  --  136*  --   ALKPHOS 79  --  435*  --   BILITOT 1.1  --  0.9  --   GFRNONAA 31* 31*  --  32*  GFRAA 36* 36*  --  37*  ANIONGAP 15 14  --  9     Hematology Recent Labs  Lab 03/07/2018 0400 03/05/2018 2129 03/13/18 0243  WBC 16.3* 19.9* 14.8*  RBC 3.41* 3.19* 3.11*  HGB 8.5* 8.0* 8.0*  HCT 27.8* 26.1* 25.8*  MCV 81.5 81.8 83.0  MCH 24.9* 25.1* 25.7*  MCHC 30.6 30.7 31.0  RDW 17.9* 18.5* 20.1*  PLT 320 308 277    Cardiac Enzymes Recent Labs  Lab 03/24/2018 1641 03/09/18 0341 03/09/18 0945 03/09/18 1557  TROPONINI 0.73* 1.08* 1.35* 1.30*    Recent Labs  Lab 03/27/2018 1008  TROPIPOC 0.36*     BNP Recent Labs  Lab 03/06/2018 0957  BNP 3,356.1*     DDimer No results for input(s): DDIMER in the last 168 hours.   Radiology    Dg Chest Port 1 View  Result Date: 03/11/2018 CLINICAL DATA:  Shortness of breath EXAM: PORTABLE CHEST 1 VIEW COMPARISON:  03/26/2018 FINDINGS: Cardiac shadow remains enlarged. Aortic calcifications are again seen. Rounded opacity is again noted in the left mid lung similar to that seen on prior plain film and CT examination. No significant improvement is noted. Improved aeration  in the right lung is noted. No bony abnormality is seen. IMPRESSION: Improved aeration in the right lung base. Stable masslike infiltrate in the left upper lobe Electronically Signed   By: Inez Catalina M.D.   On: 03/11/2018 10:37    Cardiac Studies   Echocardiogram 06/20/2016: Study Conclusions: - Left ventricle: The cavity size was normal. There was mild   concentric hypertrophy. Systolic function was mildly to   moderately reduced. The estimated ejection fraction was in the   range of 40% to 45%. Hypokinesis of the anterior and   anterolateral with akinesis of the inferolateral myocardium.   Doppler parameters are consistent with abnormal left ventricular   relaxation (grade 1 diastolic dysfunction). Doppler parameters   are consistent with indeterminate ventricular filling pressure. - Aortic valve: Transvalvular velocity was within the normal range.   There was no stenosis. There was no regurgitation. - Mitral valve: Transvalvular velocity was within the normal range.   There was no evidence for stenosis. There was mild regurgitation. - Right ventricle: Systolic function was normal. - Atrial septum: No defect or patent foramen ovale was identified   by color flow Doppler. - Tricuspid valve: There was trivial regurgitation. - Pulmonary arteries: Systolic pressure was within the normal   range. PA peak pressure: 21 mm Hg (S). _______________  Left Heart Catheterization 03/02/2016:  Prox RCA to Mid RCA lesion, 90 %stenosed.  Dist LAD lesion, 85 %stenosed.    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 The right coronary is a very large territory and also has left right collaterals from the left coronary system. The likely scenario is the patient's inferolateral infarction occurred due to thrombotic occlusion and she subsequently had on pain is recanalization.  Widely patent circumflex.  Diffuse 85-90% apical LAD  disease.  Elevated left ventricular end-diastolic pressure. Previously documented LVEF in the 30-35% range by noninvasive imaging.  Recommendations:  In absence of angina or ischemia on nuclear scintigraphy, medical therapy would appear to be the treatment of choice.  LV dysfunction is likely secondary to chronic remodeling following inferolateral infarction which occurred remotely.  Guideline mandated heart failure therapy. Discontinuation amlodipine will allow further up titration of heart failure agents.  Patient Profile   Ms. Laver is a 78 y.o. female with a history of of CAD, ischemic cardiomyopathy with EF of 40-45%  on Echo in 06/2016, hyperlipidemia, COPD, and left breast cancer s/p radiation and lumpectomy who presented to the [facility] ED on 03/08/2017 for evaluation of worsening shortness of breath and edema. On arrival, she was found to have profound anemia with a hemoglobin of 5.6. Chest x-ray consistent with left upper lobe pneumonia. Troponin also elevated. Cardiology was consulted for acute on chronic systolic CHF and NSTEMI.  Assessment & Plan    Acute Hypoxemic Respiratory Failure  - Likely multifactorial. Currently being treated for pneumonia and COPD exacerbation. However, exam and labs also consistent with acute on chronic CHF. - Chest x-ray on 03/11/2018 showed improved aeration in the right lung base with stable masslike infiltrate in the left upper lobe. - Per primary team.  Acute Combined Systolic and Diastolic CHF  - Cardiomyopathy may be ischemic with known CAD and newly reduced EF. - BNP elevated at 3,356.1. - Echo showed LVEF of 15-20% (down from 40-45% in 06/2016) with diffuse hypokinesis and grade 2 diastolic dysfunction.   - Net positive 3.5 L this admission. Diuresis has been limited by hypotension. - Will continue to hold Lasix at this time due to hypotension.  - Will hold Toprol this morning given most recent systolic BP in the 54U.  - No ACEi/ARB/ARNI due  to hypotension.  New Onset Atrial Fibrillation  - Telemetry shows atrial fibrillation with ventricular rates in the 90's to 110's. Suspect this is secondary to acute illnesses. - Will hold anticoagulation at this time due to acute anemia of unknown etiology.   Elevated Troponin with Known CAD - Troponin peaked at 1.35 and is trending down. - Continue Aspirin.  - Continue Lipitor and Zetia. - Continue low dose beta-blocker as blood pressure allows. Morning dose being held as stated above. - Will hold Imdur given borderline BP. - Patient will ultimately need a cardiac catheterization but this is on hold due to acute anemia.   Acute Anemia of Unknown Etiology - Hemoglobin 5.6 on admission (11.3 in 02/2016).  - Patient received 2 units of PRBC.  - Hemoglobin stable at 8.0 today. - GI is following patient. Per GI note, "EGD can be performed if her oxygen requirement drop down to 2 liters or less on nasal canula."  Community Acquired Pneumonia - Continue antibiotics per primary team.    For questions or updates, please contact [place] Please consult [email or web address] for contact info under        Signed, Darreld Mclean, PA-C  03/13/2018, 9:28 AM    ---------------------------------------------------------------------------------------------   History and all data above reviewed.  Patient examined.  I agree with the findings as above.  AMINAH ZABAWA feels better today, after breathing treatments usually feels best.   Constitutional: No acute distress, thin Cardiovascular: irregular rhythm, variable rate, no murmurs. S1 and S2 normal. Radial pulses normal bilaterally. No jugular venous distention.  Respiratory: clear to auscultation bilaterally GI : normal bowel sounds, soft and nontender. No distention.   MSK: extremities warm, well perfused. No edema.  NEURO: grossly nonfocal exam, moves all extremities. PSYCH: alert and oriented x 3, normal mood and affect.   All  available labs, radiology testing, previous records reviewed. Agree with documented assessment and plan of my colleague as stated above with the following additions or changes:  Principal Problem:   PNA (pneumonia) Active Problems:   COPD (chronic obstructive pulmonary disease) (HCC)   SOB (shortness of breath)   Ischemic cardiomyopathy   Normocytic anemia   NSTEMI (non-ST elevated myocardial infarction) ([place])  Abnormal CXR   Atrial fibrillation (HCC)   Acute combined systolic and diastolic heart failure (HCC)   Acute respiratory distress    Plan: holding lasix but she is net positive, will likely need to reinitiate gentle diuresis soon so as to mobilize fluids.   Would ideally like to reassess coronaries with angiography but challenging from medical standpoint. Will continue to attempt to titrate HF therapy.   Time Spent Directly with Patient:  I have spent a total of 25 minutes with the patient reviewing hospital notes, telemetry, EKGs, labs and examining the patient as well as establishing an assessment and plan that was discussed personally with the patient.  > 50% of time was spent in direct patient care.  Length of Stay:  LOS: 6 days   Elouise Munroe, MD [facility]

## 2018-03-14 ENCOUNTER — Inpatient Hospital Stay (HOSPITAL_COMMUNITY): Payer: Medicare PPO

## 2018-03-14 ENCOUNTER — Inpatient Hospital Stay (HOSPITAL_COMMUNITY): Payer: Medicare PPO | Admitting: Anesthesiology

## 2018-03-14 ENCOUNTER — Encounter (HOSPITAL_COMMUNITY): Admission: EM | Disposition: E | Payer: Self-pay | Source: Home / Self Care | Attending: Internal Medicine

## 2018-03-14 DIAGNOSIS — I639 Cerebral infarction, unspecified: Secondary | ICD-10-CM

## 2018-03-14 DIAGNOSIS — N179 Acute kidney failure, unspecified: Secondary | ICD-10-CM

## 2018-03-14 DIAGNOSIS — I6601 Occlusion and stenosis of right middle cerebral artery: Secondary | ICD-10-CM | POA: Diagnosis present

## 2018-03-14 HISTORY — PX: IR PERCUTANEOUS ART THROMBECTOMY/INFUSION INTRACRANIAL INC DIAG ANGIO: IMG6087

## 2018-03-14 HISTORY — PX: IR CT HEAD LTD: IMG2386

## 2018-03-14 HISTORY — PX: RADIOLOGY WITH ANESTHESIA: SHX6223

## 2018-03-14 LAB — PROTIME-INR
INR: 1.35
Prothrombin Time: 16.5 seconds — ABNORMAL HIGH (ref 11.4–15.2)

## 2018-03-14 LAB — CBC
HEMATOCRIT: 26.2 % — AB (ref 36.0–46.0)
Hemoglobin: 7.9 g/dL — ABNORMAL LOW (ref 12.0–15.0)
MCH: 25 pg — ABNORMAL LOW (ref 26.0–34.0)
MCHC: 30.2 g/dL (ref 30.0–36.0)
MCV: 82.9 fL (ref 80.0–100.0)
Platelets: 249 10*3/uL (ref 150–400)
RBC: 3.16 MIL/uL — ABNORMAL LOW (ref 3.87–5.11)
RDW: 19.9 % — ABNORMAL HIGH (ref 11.5–15.5)
WBC: 15.4 10*3/uL — ABNORMAL HIGH (ref 4.0–10.5)
nRBC: 0.2 % (ref 0.0–0.2)

## 2018-03-14 LAB — GLUCOSE, CAPILLARY: GLUCOSE-CAPILLARY: 133 mg/dL — AB (ref 70–99)

## 2018-03-14 LAB — BASIC METABOLIC PANEL
Anion gap: 8 (ref 5–15)
BUN: 48 mg/dL — ABNORMAL HIGH (ref 8–23)
CO2: 21 mmol/L — ABNORMAL LOW (ref 22–32)
CREATININE: 1.28 mg/dL — AB (ref 0.44–1.00)
Calcium: 8 mg/dL — ABNORMAL LOW (ref 8.9–10.3)
Chloride: 98 mmol/L (ref 98–111)
GFR calc non Af Amer: 41 mL/min — ABNORMAL LOW (ref >60)
GFR, EST AFRICAN AMERICAN: 47 mL/min — AB (ref >60)
Glucose, Bld: 128 mg/dL — ABNORMAL HIGH (ref 70–99)
Potassium: 3.8 mmol/L (ref 3.5–5.1)
Sodium: 127 mmol/L — ABNORMAL LOW (ref 135–145)

## 2018-03-14 LAB — MAGNESIUM: Magnesium: 2.3 mg/dL (ref 1.7–2.4)

## 2018-03-14 LAB — PREPARE RBC (CROSSMATCH)

## 2018-03-14 SURGERY — RADIOLOGY WITH ANESTHESIA
Anesthesia: General

## 2018-03-14 MED ORDER — ARFORMOTEROL TARTRATE 15 MCG/2ML IN NEBU
15.0000 ug | INHALATION_SOLUTION | Freq: Two times a day (BID) | RESPIRATORY_TRACT | Status: DC
  Administered 2018-03-15 – 2018-03-21 (×13): 15 ug via RESPIRATORY_TRACT
  Filled 2018-03-14 (×22): qty 2

## 2018-03-14 MED ORDER — IOHEXOL 300 MG/ML  SOLN
50.0000 mL | Freq: Once | INTRAMUSCULAR | Status: AC | PRN
  Administered 2018-03-14: 50 mL via INTRA_ARTERIAL

## 2018-03-14 MED ORDER — STROKE: EARLY STAGES OF RECOVERY BOOK
Freq: Once | Status: DC
  Filled 2018-03-14: qty 1

## 2018-03-14 MED ORDER — FENTANYL BOLUS VIA INFUSION
25.0000 ug | INTRAVENOUS | Status: DC | PRN
  Filled 2018-03-14: qty 25

## 2018-03-14 MED ORDER — CLEVIDIPINE BUTYRATE 0.5 MG/ML IV EMUL: 0.0000 mg/h | INTRAVENOUS | Status: AC

## 2018-03-14 MED ORDER — TICAGRELOR 90 MG PO TABS
ORAL_TABLET | ORAL | Status: AC
  Filled 2018-03-14: qty 2

## 2018-03-14 MED ORDER — DOCUSATE SODIUM 50 MG/5ML PO LIQD
100.0000 mg | Freq: Two times a day (BID) | ORAL | Status: DC | PRN
  Administered 2018-03-16: 100 mg
  Filled 2018-03-14: qty 10

## 2018-03-14 MED ORDER — ETOMIDATE 2 MG/ML IV SOLN
INTRAVENOUS | Status: DC | PRN
  Administered 2018-03-14: 12 mg via INTRAVENOUS

## 2018-03-14 MED ORDER — CLOPIDOGREL BISULFATE 300 MG PO TABS
ORAL_TABLET | ORAL | Status: AC
  Filled 2018-03-14: qty 1

## 2018-03-14 MED ORDER — LIDOCAINE HCL 1 % IJ SOLN
INTRAMUSCULAR | Status: AC
  Filled 2018-03-14: qty 20

## 2018-03-14 MED ORDER — IOPAMIDOL (ISOVUE-370) INJECTION 76%
100.0000 mL | Freq: Once | INTRAVENOUS | Status: AC | PRN
  Administered 2018-03-14: 100 mL via INTRAVENOUS

## 2018-03-14 MED ORDER — SUCCINYLCHOLINE CHLORIDE 200 MG/10ML IV SOSY
PREFILLED_SYRINGE | INTRAVENOUS | Status: DC | PRN
  Administered 2018-03-14: 100 mg via INTRAVENOUS

## 2018-03-14 MED ORDER — SODIUM CHLORIDE 0.9 % IV SOLN
INTRAVENOUS | Status: DC
  Administered 2018-03-14: 19:00:00 via INTRAVENOUS

## 2018-03-14 MED ORDER — FENTANYL CITRATE (PF) 100 MCG/2ML IJ SOLN: 50.0000 ug | Freq: Once | INTRAMUSCULAR | Status: DC

## 2018-03-14 MED ORDER — ACETAMINOPHEN 325 MG PO TABS: 650.0000 mg | ORAL_TABLET | ORAL | Status: DC | PRN

## 2018-03-14 MED ORDER — ROCURONIUM BROMIDE 10 MG/ML (PF) SYRINGE
PREFILLED_SYRINGE | INTRAVENOUS | Status: DC | PRN
  Administered 2018-03-14: 20 mg via INTRAVENOUS
  Administered 2018-03-14: 50 mg via INTRAVENOUS

## 2018-03-14 MED ORDER — TIROFIBAN HCL IN NACL 5-0.9 MG/100ML-% IV SOLN
INTRAVENOUS | Status: AC
  Filled 2018-03-14: qty 100

## 2018-03-14 MED ORDER — CEFAZOLIN SODIUM-DEXTROSE 2-3 GM-%(50ML) IV SOLR
INTRAVENOUS | Status: DC | PRN
  Administered 2018-03-14: 2 g via INTRAVENOUS

## 2018-03-14 MED ORDER — PHENYLEPHRINE HCL-NACL 10-0.9 MG/250ML-% IV SOLN
25.0000 ug/min | INTRAVENOUS | Status: DC
  Administered 2018-03-14: 50 ug/min via INTRAVENOUS
  Administered 2018-03-15: 125 ug/min via INTRAVENOUS
  Administered 2018-03-15: 80 ug/min via INTRAVENOUS
  Administered 2018-03-15: 13.333 ug/min via INTRAVENOUS
  Administered 2018-03-15: 140 ug/min via INTRAVENOUS
  Administered 2018-03-15: 135 ug/min via INTRAVENOUS
  Filled 2018-03-14 (×6): qty 250

## 2018-03-14 MED ORDER — EPTIFIBATIDE 20 MG/10ML IV SOLN
INTRAVENOUS | Status: AC
  Filled 2018-03-14: qty 10

## 2018-03-14 MED ORDER — BUDESONIDE 0.25 MG/2ML IN SUSP
0.2500 mg | Freq: Two times a day (BID) | RESPIRATORY_TRACT | Status: DC
  Administered 2018-03-15 – 2018-03-21 (×15): 0.25 mg via RESPIRATORY_TRACT
  Filled 2018-03-14 (×16): qty 2

## 2018-03-14 MED ORDER — BISACODYL 10 MG RE SUPP
10.0000 mg | Freq: Every day | RECTAL | Status: DC | PRN
  Administered 2018-03-16: 10 mg via RECTAL
  Filled 2018-03-14: qty 1

## 2018-03-14 MED ORDER — FENTANYL 2500MCG IN NS 250ML (10MCG/ML) PREMIX INFUSION
25.0000 ug/h | INTRAVENOUS | Status: DC
  Administered 2018-03-15: 75 ug/h via INTRAVENOUS
  Filled 2018-03-14: qty 250

## 2018-03-14 MED ORDER — ACETAMINOPHEN 160 MG/5ML PO SOLN
650.0000 mg | ORAL | Status: DC | PRN
  Administered 2018-03-15: 650 mg
  Filled 2018-03-14: qty 20.3

## 2018-03-14 MED ORDER — CEFAZOLIN SODIUM-DEXTROSE 2-4 GM/100ML-% IV SOLN
INTRAVENOUS | Status: AC
  Filled 2018-03-14: qty 100

## 2018-03-14 MED ORDER — CHLORHEXIDINE GLUCONATE 0.12% ORAL RINSE (MEDLINE KIT)
15.0000 mL | Freq: Two times a day (BID) | OROMUCOSAL | Status: DC
  Administered 2018-03-14 – 2018-03-21 (×13): 15 mL via OROMUCOSAL

## 2018-03-14 MED ORDER — SODIUM CHLORIDE 0.9 % IV SOLN
INTRAVENOUS | Status: DC | PRN
  Administered 2018-03-14: 30 ug/min via INTRAVENOUS

## 2018-03-14 MED ORDER — SODIUM CHLORIDE 0.9% IV SOLUTION: Freq: Once | INTRAVENOUS | Status: DC

## 2018-03-14 MED ORDER — FAMOTIDINE 40 MG/5ML PO SUSR: 20.0000 mg | Freq: Two times a day (BID) | ORAL | Status: DC

## 2018-03-14 MED ORDER — PROPOFOL 500 MG/50ML IV EMUL
INTRAVENOUS | Status: DC | PRN
  Administered 2018-03-14: 25 ug/kg/min via INTRAVENOUS

## 2018-03-14 MED ORDER — SODIUM CHLORIDE 0.9 % IV SOLN
INTRAVENOUS | Status: DC | PRN
  Administered 2018-03-14: 21:00:00 via INTRAVENOUS

## 2018-03-14 MED ORDER — NITROGLYCERIN 1 MG/10 ML FOR IR/CATH LAB
INTRA_ARTERIAL | Status: AC | PRN
  Administered 2018-03-14: 25 ug via INTRA_ARTERIAL

## 2018-03-14 MED ORDER — ACETAMINOPHEN 650 MG RE SUPP: 650.0000 mg | RECTAL | Status: DC | PRN

## 2018-03-14 MED ORDER — ORAL CARE MOUTH RINSE
15.0000 mL | OROMUCOSAL | Status: DC
  Administered 2018-03-15 – 2018-03-16 (×18): 15 mL via OROMUCOSAL

## 2018-03-14 MED ORDER — NITROGLYCERIN 1 MG/10 ML FOR IR/CATH LAB
INTRA_ARTERIAL | Status: AC
  Filled 2018-03-14: qty 10

## 2018-03-14 MED ORDER — SODIUM CHLORIDE 0.9 % IV SOLN
INTRAVENOUS | Status: DC
  Administered 2018-03-14 – 2018-03-18 (×5): via INTRAVENOUS

## 2018-03-14 MED ORDER — ASPIRIN 325 MG PO TABS
ORAL_TABLET | ORAL | Status: AC
  Filled 2018-03-14: qty 1

## 2018-03-14 MED ORDER — SODIUM CHLORIDE 0.9 % IV SOLN: 250.0000 mL | INTRAVENOUS | Status: DC

## 2018-03-14 NOTE — Sedation Documentation (Addendum)
Upon arrival to to 4N25 R fem artery dressing saturated with blood, blood pooled between legs. Manual pressure applied to artery by Melchor Amour, RTR. Groin level 0, Hemostasis achieved with manual pressure. Vpad/gauze tegaderm bandage reapplied. Groin level 0, pulses dopplerable.

## 2018-03-14 NOTE — Progress Notes (Signed)
Patient ID: Kelly Benjamin, female   DOB: 02/15/1941, 78 y.o.   MRN: [id number] INR . 7 y RT H F LSW  700pm Nes onset lt sided weakness and gaze deviation.CT Brain No ICH. ASPECTS 6. CTA probable T occlusion of RT ICA . No robust collaterals. MRRS 0 prior to this admission for pneumonia. TPA not given. Option of endovascular revascularization D/W power of attorney. Procedure ,reasons,risks reviewed,including alternatives. Risks of ICH of 10 % ,worsening neuro function,vent dependency,death ,vascular injury all reviewed.POA expressed understanding and consenttd to treatment under [place].Informed witnessed consent obtained.Marland Kitchen S.Laniece Hornbaker MD

## 2018-03-14 NOTE — Sedation Documentation (Signed)
Spoke with Denice Paradise, RN, 4N Charge, to confirm bed assignment.

## 2018-03-14 NOTE — Consult Note (Addendum)
NAME:  Kelly Benjamin, MRN:  [id number], DOB:  22-Sep-1940, LOS: 3 ADMISSION DATE:  03/24/2018, CONSULTATION DATE:  03/15/2018 REFERRING MD:  [facility], CHIEF COMPLAINT:  Vent management  History of present illness   78 year old woman with history of CAD, ischemic cardiomyopathy, HFrEF (EF 40-45% in 2018 and now 15-20% in 2020), COPD, HTN admitted on 03/08/2018 with dyspnea on exertion and BLE edema found to have left upper lobe pneumonia, acute symptomatic anemia (hgb 5.6), NSTEMI, and new onset atrial fibrillation. Initially required BiPAP. She was given Rocephin and azithromycin but this was transitioned to vanc/cefepime. She was diuresed during the hospital course. Tonight, she developed new onset left sided weakness and gaze deviation. CTA noted total occlusion of right proximal M1 segment without collaterals. Underwent right common carotid arteriogram and found to have occluded right ICA terminus, right MCA and right ACA. She underwent thrombectomy with IR 04/03/2018.   Past Medical History   CAD, ischemic cardiomyopathy, HFrEF (EF 40-45% in 2018), COPD, HTN, breast cancer, HLD  Significant Hospital Events   1/4> admit 1/10> thrombectomy, transfer to ICU  Consults:  IR 1/10 Neuro 1/10 GI 1/5 Cards 1/4  Procedures:  1/10> IR thrombectomy  Significant Diagnostic Tests:  CTA head/neck w/ w/o 1/10> 1. Emergent large vessel occlusion involving the proximal right M1 segment. 2. Very poor collateral flow to the right MCA distribution. 3. 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 4. Opacification of the left vertebral artery above the V1 segment may be retrograde flow. 5. Mild diffuse small vessel disease within the ACA branch vessels, left MCA branch vessels, and posterior circulation.  CT head w/o contrast 1/10> Acute right MCA territory infarct with decreased density in the right lentiform nucleus, right insular cortex, and right M1  and M2 cortex. Hyperdense right MCA suggesting acute thrombus.  CXR 1/7> Rounded opacity is again noted in the left mid lung   CT chest w/o contrast 1/4> Dense left upper lobe airspace disease. Small left pleural effusion noted. Mild interlobular septal thickening. Calcific coronary artery disease. Aortic atherosclerosis and emphysema  TTE 1/6>  - Left ventricle: The cavity size was normal. Wall thickness was increased in a pattern of mild LVH. Systolic function was severely reduced. The estimated ejection fraction was in the range of 15% to 20%. Diffuse hypokinesis. Features are consistent with a pseudonormal left ventricular filling pattern, with concomitant abnormal relaxation and increased filling pressure (grade 2 diastolic dysfunction). Doppler parameters are consistent with high ventricular filling pressure. - Aortic valve: Trileaflet; mildly thickened, mildly calcified   leaflets. - Mitral valve: There was moderate regurgitation. - Left atrium: The atrium was severely dilated. - Right ventricle: Systolic function was moderately reduced. - Right atrium: The atrium was severely dilated. - Tricuspid valve: There was moderate regurgitation. - Pulmonary arteries: Systolic pressure was mildly increased. PA   peak pressure: 41 mm Hg (S).  Micro Data:  Blood Cx x 2 1/4 > NGTD  Antimicrobials:  Azithromycin 500 mg IV 1/4 > 1/8 Cefepime 1/7 > 1/12 Ceftriaxone 1/4 > 1/6 Vancomycin 1/6 > 1/9  Interim history/subjective:  Intubated, sedated  Objective   Blood pressure (!) 92/50, pulse 98, temperature 97.7 F (36.5 C), temperature source Oral, resp. rate (!) 22, height 5\' 10"  (1.778 m), weight 69.3 kg, SpO2 97 %.        Intake/Output Summary (Last 24 hours) at 03/05/2018 2257 Last data filed at 03/09/2018 2249 Gross per 24 hour  Intake 1458 ml  Output 2550  ml  Net -1092 ml   Filed Weights   03/29/2018 1703 03/09/18 0601 03/20/2018 0300  Weight: 67.1 kg 68 kg 69.3 kg     Examination: General: NAD HENT: [place]/AT, no scleral icterus Lungs: mechanical breath sounds, few crackles, no wheezes Cardiovascular: RRR Abdomen: Soft, nontender, nondistended Extremities: No LE edema Neuro: moving LE spontaneously, sedated GU: foley in place  Assessment & Plan:  78 year old woman with history of CAD, ischemic cardiomyopathy, HFrEF (EF 40-45% in 2018 and now 15-20% in 2020), COPD, HTN admitted on 03/18/2018 with left upper lobe pneumonia, acute symptomatic anemia (hgb 5.6), NSTEMI, and new onset atrial fibrillation. Hospital course complicated by development of new onset left sided weakness and gaze deviation. Underwent right common carotid arteriogram and found to have occluded right ICA terminus, right MCA and right ACA. She underwent thrombectomy with IR 03/25/2018.   Acute Right MCA CVA, Acute Right ICA Occlusion s/p IR Embolectomy 1/10:  -BP goal systolic between 124-580 per IR (via Cleviprex gtt prn hypertension or Neo gtt prn hypotension) -Neuro and IR following -Continue ASA, statin, Zetia -Check lipid panel and hgb A1c  Acute hypoxic respiratory failure, CAP with risk factors for pseudomonas: -Continue vent for now. SAT/SBT in AM. -When able from a blood pressure standpoint, would try to restart diuresis (and d/c continuous IV fluids) -Cefepime to complete tomorrow -Repeat CT chest as outpatient  Hyponatremia, metabolic acidosis without anion gap, AKI: Na 127. Cr 1.3 and baseline is 1.1.  -Continue to monitor  Transaminitis: Improving. Check hepatitis panel.  COPD: -Brovana and Pulmicort nebs in place of Breo while intubated -Duoneb TID  Acute symptomatic anemia: Patient's baseline hemoglobin is around 11. -Endoscopy when able per GI -Follow CBC  NSTEMI: Patient had troponin peaked to 1.35 on 1/5. Cardiology following and plan cardiac cath at some point. -Continue ASA, statin, Zetia  Acute combined systolic and diastolic CHF:  -home metoprolol  held while trying to maintain blood pressure per IR goals  New onset atrial fibrillation: Currently rate controlled  Best practice:  Diet: NPO Pain/Anxiety/Delirium protocol (if indicated): fent gtt and prn for goal RASS 0 VAP protocol (if indicated): Ordered DVT prophylaxis: SCDs GI prophylaxis: PPI Glucose control: Monitor Mobility: PT/OT Code Status: Full Family Communication: None at bedside Disposition: ICU  Labs   CBC: Recent Labs  Lab 03/09/18 0341  03/09/18 2335 03/15/2018 0400 03/25/2018 2129 03/13/18 0243 03/16/2018 0354  WBC 14.4*  --  14.0* 16.3* 19.9* 14.8* 15.4*  NEUTROABS 12.9*  --   --   --   --   --   --   HGB 8.3*   < > 8.5* 8.5* 8.0* 8.0* 7.9*  HCT 27.2*   < > 27.4* 27.8* 26.1* 25.8* 26.2*  MCV 82.7  --  81.5 81.5 81.8 83.0 82.9  PLT 359  --  297 320 308 277 249   < > = values in this interval not displayed.    Basic Metabolic Panel: Recent Labs  Lab 03/09/18 2335 03/25/2018 0400 03/11/18 0515 03/13/18 0243 03/27/2018 0354  NA 134* 135 135 128* 127*  K 3.8 3.9 3.9 3.9 3.8  CL 101 105 103 99 98  CO2 18* 15* 18* 20* 21*  GLUCOSE 181* 140* 129* 122* 128*  BUN 30* 32* 41* 53* 48*  CREATININE 1.45* 1.59* 1.61* 1.57* 1.28*  CALCIUM 9.0 9.2 8.9 8.3* 8.0*  MG  --   --   --  2.2 2.3   GFR: Estimated Creatinine Clearance: 40.4 mL/min (A) (by C-G  formula based on SCr of 1.28 mg/dL (H)). Recent Labs  Lab 03/07/2018 0400 04/03/2018 1906 03/22/2018 2129 03/11/18 0515 03/12/18 0518 03/13/18 0243 03/05/2018 0354  PROCALCITON  --   --   --   --  1.14  --   --   WBC 16.3*  --  19.9*  --   --  14.8* 15.4*  LATICACIDVEN 2.2* 5.6* 2.7* 1.4  --   --   --     Liver Function Tests: Recent Labs  Lab 03/18/2018 0400 03/12/18 0518  AST 214* 68*  ALT 172* 136*  ALKPHOS 79 435*  BILITOT 1.1 0.9  PROT 6.2* 5.3*  ALBUMIN 2.4* 2.0*   No results for input(s): LIPASE, AMYLASE in the last 168 hours. No results for input(s): AMMONIA in the last 168 hours.  ABG     Component Value Date/Time   PHART 7.487 (H) 03/11/2018 0440   PCO2ART 25.5 (L) 03/11/2018 0440   PO2ART 139 (H) 03/11/2018 0440   HCO3 19.2 (L) 03/11/2018 0440   TCO2 19 (L) 03/09/2018 1631   ACIDBASEDEF 3.7 (H) 03/11/2018 0440   O2SAT 98.8 03/11/2018 0440     Coagulation Profile: Recent Labs  Lab 03/25/2018 2201  INR 1.35    Cardiac Enzymes: Recent Labs  Lab 03/30/2018 1641 03/09/18 0341 03/09/18 0945 03/09/18 1557  TROPONINI 0.73* 1.08* 1.35* 1.30*    HbA1C: Hgb A1c MFr Bld  Date/Time Value Ref Range Status  02/29/2016 05:01 AM 5.4 4.8 - 5.6 % Final    Comment:    (NOTE)         Pre-diabetes: 5.7 - 6.4         Diabetes: >6.4         Glycemic control for adults with diabetes: <7.0     CBG: Recent Labs  Lab 03/09/18 1437 03/10/2018 2024  GLUCAP 158* 133*    Review of Systems:   Unable to obtain due to intubation/sedation  Past Medical History  She,  has a past medical history of Breast cancer ([place]) (09/2017), CAD in native artery (02/28/2016), COPD (chronic obstructive pulmonary disease) ([place]), Dyslipidemia, goal LDL below 70, Hypertension, Ischemic cardiomyopathy, and Personal history of radiation therapy.   Surgical History    Past Surgical History:  Procedure Laterality Date  . APPENDECTOMY    . BREAST LUMPECTOMY  09/2017  . BREAST SURGERY  2000  . CARDIAC CATHETERIZATION N/A 03/02/2016   Procedure: Left Heart Cath and Coronary Angiography;  Surgeon: Belva Crome, MD;  Location: [facility];  Service: Cardiovascular;  Laterality: N/A;  . CATARACT EXTRACTION    . KNEE SURGERY       Social History   reports that she has quit smoking. She has quit using smokeless tobacco. She reports current alcohol use. She reports that she does not use drugs.   Family History   Her family history includes Heart attack in her father.   Allergies Allergies  Allergen Reactions  . Sulfa Antibiotics Swelling     Home Medications  Prior to Admission  medications   Medication Sig Start Date End Date Taking? Authorizing Provider  aspirin EC 81 MG tablet Take 1 tablet (81 mg total) by mouth daily. 03/05/16  Yes Bonnielee Haff, MD  atorvastatin (LIPITOR) 40 MG tablet TAKE 1 TABLET BY MOUTH EVERY DAY Patient taking differently: Take 40 mg by mouth at bedtime.  10/11/17  Yes Lorretta Harp, MD  clopidogrel (PLAVIX) 75 MG tablet TAKE 1 TABLET BY MOUTH EVERY DAY Patient  taking differently: Take 75 mg by mouth daily.  01/21/17  Yes Lorretta Harp, MD  ENTRESTO 97-103 MG TAKE 1 TABLET BY MOUTH 2 (TWO) TIMES DAILY. Patient taking differently: Take 1 tablet by mouth 2 (two) times daily.  01/13/18  Yes Lorretta Harp, MD  ezetimibe (ZETIA) 10 MG tablet TAKE 1 TABLET EVERY DAY Patient taking differently: Take 10 mg by mouth daily.  12/18/17  Yes Lorretta Harp, MD  fluticasone furoate-vilanterol (BREO ELLIPTA) 200-25 MCG/INH AEPB Inhale 1 puff into the lungs daily. 03/06/16  Yes Bonnielee Haff, MD  furosemide (LASIX) 40 MG tablet TAKE 1 TABLET BY MOUTH EVERY DAY Patient taking differently: Take 40 mg by mouth daily.  01/21/17  Yes Lorretta Harp, MD  isosorbide mononitrate (IMDUR) 30 MG 24 hr tablet TAKE 1 TABLET BY MOUTH EVERY DAY Patient taking differently: Take 30 mg by mouth daily.  01/21/17  Yes Lorretta Harp, MD  metoprolol succinate (TOPROL-XL) 25 MG 24 hr tablet TAKE 1 TABLET BY MOUTH EVERY DAY Patient taking differently: Take 25 mg by mouth daily.  01/21/17  Yes Lorretta Harp, MD  Multiple Vitamins-Minerals (ICAPS AREDS 2) CAPS Take 1 capsule by mouth 2 (two) times daily.   Yes [provider]  SPIRIVA HANDIHALER 18 MCG inhalation capsule INHALE CONTENTS OF 1 CAPSULE EVERY DAY Patient taking differently: Place 18 mcg into inhaler and inhale.  02/01/17  Yes Lorretta Harp, MD     Critical care time: The patient is critically ill with multiple organ systems failure and requires high complexity decision making for  assessment and support, frequent evaluation and titration of therapies, application of advanced monitoring technologies and extensive interpretation of multiple databases.   Critical Care Time devoted to patient care services described in this note is  45 Minutes. This time reflects time of care of this signee. This critical care time does not reflect procedure time, or teaching time or supervisory time of PA/NP/Med student/Med Resident etc but could involve care discussion time.  Jacques Earthly, M.D. [facility] Pulmonary/Critical Care Medicine After hours pager: [phone].

## 2018-03-14 NOTE — Anesthesia Procedure Notes (Signed)
Arterial Line Insertion Start/End01/27/2020 9:25 PM, 03/14/2018 9:30 PM Performed by: Myna Bright, CRNA, CRNA  Patient location: OOR procedure area. Preanesthetic checklist: patient identified, IV checked, monitors and equipment checked, pre-op evaluation and timeout performed Emergency situation Patient sedated Left, radial was placed Catheter size: 20 G Hand hygiene performed  and maximum sterile barriers used  Allen's test indicative of satisfactory collateral circulation Attempts: 1 Procedure performed without using ultrasound guided technique. Following insertion, dressing applied and Biopatch. Post procedure assessment: normal  Patient tolerated the procedure well with no immediate complications.

## 2018-03-14 NOTE — Sedation Documentation (Signed)
Chip, RN from PACU brought ICU bed for pt.

## 2018-03-14 NOTE — Sedation Documentation (Signed)
Spoke with Stanton Kidney in pt placement to confirm bed request. Pt assigned to [id number].

## 2018-03-14 NOTE — Progress Notes (Signed)
I responded to a Code Stroke page from the nurse. I visited the patient's room, however the patient was taken to CT. There was no family present. I spoke with the Unit Secretary and shared that the Chaplain is available for additional support as needed or requested.    03/22/2018 2000  Clinical Encounter Type  Visited With Patient not available  Visit Type Spiritual support  Referral From Nurse  Consult/Referral To Chaplain  Spiritual Encounters  Spiritual Needs Prayer    Chaplain Dr Redgie Grayer

## 2018-03-14 NOTE — Sedation Documentation (Signed)
SBAR called to Wells Guiles, Therapist, sports.

## 2018-03-14 NOTE — Plan of Care (Signed)
  Problem: Education: Goal: Knowledge of General Education information will improve Description Including pain rating scale, medication(s)/side effects and non-pharmacologic comfort measures Outcome: Progressing   Problem: Clinical Measurements: Goal: Will remain free from infection Outcome: Progressing Goal: Respiratory complications will improve Outcome: Progressing Goal: Cardiovascular complication will be avoided Outcome: Progressing   

## 2018-03-14 NOTE — Progress Notes (Addendum)
PROGRESS NOTE    Kelly Benjamin  SAY:[id number]  DOB: 03/30/1940  DOA: 03/26/2018 PCP: Maurice Small, MD  Brief Narrative:  78 year old with history of CAD, CHF with prior EF 45%, COPD, essential hypertension came to the hospital with complaints of dyspnea on exertion and bilateral lower extremity swelling.  Patient was hypoxic and noted to have hemoglobin of 5.6 as well as elevated troponin.  On the chest x-ray there was suggestion of left upper lobe opacification.  CT chest without contrast showed dense left upper lobe airspace disease, presumably pneumonia.Radiologist recommend plain film follow-up after appropriate therapy to exclude underlying neoplasm.  CT also reported small left pleural effusion and mild interlobular septal thickening suggestive of interstitial edema.  Patient underwent echocardiogram in this admission showing stage II diastolic dysfunction and systolic cardiomyopathy with EF of 15 to 20%.  Patient seen by cardiology and underwent IV diuresis during the hospital course while watching low blood pressure.  She also required BiPAP during the initial hospital course.  Antibiotic course initiated with Rocephin and azithromycin but later switched to cefepime and vancomycin.  She has also been on steroids for possible COPD component.   Subjective:  Patient currently saturating well on 2 L nasal cannula and denies any chest pain.  Family member bedside.  Objective: Vitals:   03/11/2018 0300 03/24/2018 0400 03/21/2018 0435 04/03/2018 0852  BP:   (!) 92/50   Pulse: 93 (!) 104 94   Resp: (!) 29 (!) 25 20   Temp:   97.7 F (36.5 C)   TempSrc:   Oral   SpO2: 97% 97% 96% 96%  Weight:      Height:        Intake/Output Summary (Last 24 hours) at 04/02/2018 1147 Last data filed at 03/12/2018 1041 Gross per 24 hour  Intake 303 ml  Output 750 ml  Net -447 ml   Filed Weights   03/15/2018 1703 03/09/18 0601 03/05/2018 0300  Weight: 67.1 kg 68 kg 69.3 kg    Physical  Examination:  General exam: Appears calm and comfortable  Respiratory system: Clear to auscultation. Respiratory effort normal. Cardiovascular system: S1 & S2 heard, RRR. No JVD, murmurs, rubs, gallops or clicks. No pedal edema. Gastrointestinal system: Abdomen is nondistended, soft and nontender. No organomegaly or masses felt. Normal bowel sounds heard. Central nervous system: Alert and oriented. No focal neurological deficits. Extremities: Symmetric 4 x 5 power. Skin: No rashes, lesions or ulcers Psychiatry: Judgement and insight appear normal. Mood & affect anxious    Data Reviewed: I have personally reviewed following labs and imaging studies  CBC: Recent Labs  Lab 03/09/18 0341  03/09/18 2335 03/31/2018 0400 03/20/2018 2129 03/13/18 0243 03/31/2018 0354  WBC 14.4*  --  14.0* 16.3* 19.9* 14.8* 15.4*  NEUTROABS 12.9*  --   --   --   --   --   --   HGB 8.3*   < > 8.5* 8.5* 8.0* 8.0* 7.9*  HCT 27.2*   < > 27.4* 27.8* 26.1* 25.8* 26.2*  MCV 82.7  --  81.5 81.5 81.8 83.0 82.9  PLT 359  --  297 320 308 277 249   < > = values in this interval not displayed.   Basic Metabolic Panel: Recent Labs  Lab 03/09/18 2335 03/07/2018 0400 03/11/18 0515 03/13/18 0243 03/12/2018 0354  NA 134* 135 135 128* 127*  K 3.8 3.9 3.9 3.9 3.8  CL 101 105 103 99 98  CO2 18* 15* 18* 20* 21*  GLUCOSE  181* 140* 129* 122* 128*  BUN 30* 32* 41* 53* 48*  CREATININE 1.45* 1.59* 1.61* 1.57* 1.28*  CALCIUM 9.0 9.2 8.9 8.3* 8.0*  MG  --   --   --  2.2 2.3   GFR: Estimated Creatinine Clearance: 40.4 mL/min (A) (by C-G formula based on SCr of 1.28 mg/dL (H)). Liver Function Tests: Recent Labs  Lab 04/03/2018 0400 03/12/18 0518  AST 214* 68*  ALT 172* 136*  ALKPHOS 79 435*  BILITOT 1.1 0.9  PROT 6.2* 5.3*  ALBUMIN 2.4* 2.0*   No results for input(s): LIPASE, AMYLASE in the last 168 hours. No results for input(s): AMMONIA in the last 168 hours. Coagulation Profile: No results for input(s): INR,  PROTIME in the last 168 hours. Cardiac Enzymes: Recent Labs  Lab 03/17/2018 1641 03/09/18 0341 03/09/18 0945 03/09/18 1557  TROPONINI 0.73* 1.08* 1.35* 1.30*   BNP (last 3 results) No results for input(s): PROBNP in the last 8760 hours. HbA1C: No results for input(s): HGBA1C in the last 72 hours. CBG: Recent Labs  Lab 03/09/18 1437  GLUCAP 158*   Lipid Profile: No results for input(s): CHOL, HDL, LDLCALC, TRIG, CHOLHDL, LDLDIRECT in the last 72 hours. Thyroid Function Tests: No results for input(s): TSH, T4TOTAL, FREET4, T3FREE, THYROIDAB in the last 72 hours. Anemia Panel: No results for input(s): VITAMINB12, FOLATE, FERRITIN, TIBC, IRON, RETICCTPCT in the last 72 hours. Sepsis Labs: Recent Labs  Lab 03/30/2018 0400 03/31/2018 1906 03/07/2018 2129 03/11/18 0515 03/12/18 0518  PROCALCITON  --   --   --   --  1.14  LATICACIDVEN 2.2* 5.6* 2.7* 1.4  --     Recent Results (from the past 240 hour(s))  Culture, blood (routine x 2) Call MD if unable to obtain prior to antibiotics being given     Status: None   Collection Time: 03/27/2018  4:14 PM  Result Value Ref Range Status   Specimen Description BLOOD RIGHT ARM  Final   Special Requests   Final    BOTTLES DRAWN AEROBIC AND ANAEROBIC Blood Culture results may not be optimal due to an inadequate volume of blood received in culture bottles   Culture   Final    NO GROWTH 5 DAYS Performed at [facility], [place] [address] [place], [place] [address]    Report Status 03/13/2018 FINAL  Final  Culture, blood (routine x 2) Call MD if unable to obtain prior to antibiotics being given     Status: None   Collection Time: 03/14/2018  4:21 PM  Result Value Ref Range Status   Specimen Description BLOOD RIGHT HAND  Final   Special Requests   Final    AEROBIC BOTTLE ONLY Blood Culture results may not be optimal due to an inadequate volume of blood received in culture bottles   Culture   Final    NO GROWTH 5 DAYS Performed at [facility], [place] [address], [place], [place] [address]    Report Status 03/13/2018 FINAL  Final      Radiology Studies: No results found.      Scheduled Meds: . aspirin EC  81 mg Oral Daily  . atorvastatin  40 mg Oral QHS  . calcium carbonate  1 tablet Oral Daily  . ezetimibe  10 mg Oral Daily  . fluticasone furoate-vilanterol  1 puff Inhalation Daily  . ipratropium-albuterol  3 mL Nebulization TID  . metoprolol succinate  25 mg Oral Daily  . multivitamin  1 tablet Oral  BID  . pantoprazole  40 mg Oral Daily  . sodium bicarbonate  650 mg Oral TID  . sodium chloride flush  3 mL Intravenous Q12H   Continuous Infusions: . sodium chloride 10 mL/hr at 03/12/18 0534  . ceFEPime (MAXIPIME) IV 2 g (03/13/18 2131)  . vancomycin 1,000 mg (03/13/18 2133)    Assessment & Plan:    1.  Acute hypoxic respiratory failure present on admission.  Likely multifactorial in the setting of combined systolic/diastolic CHF, left upper lobe infiltrate and underlying COPD.  Currently diuretics on hold and she continues to be on antibiotics/steroids.  Will need repeat chest x-ray in few weeks to ensure resolution of pneumonia.  Blood cultures showed no growth. Will d/c vancomycin as received 5 days rx. No h/o MRSA and screen not performed in this admission. Can continue Cefepime    2. Severe symptomatic anemia: No obvious signs of bleeding.  Patient's baseline hemoglobin is around 11. Patient was on dual antiplatelet agents  at home.  Currently only on aspirin. Status post 2 units of PRBC transfusion in this hospitalization.  GI plans on endoscopy (timing in terms of before versus after cardiac cath per GI/cardiology) on oral PPI at this point.   3.  Non-STEMI: Patient had troponin peaked to 1.35 during the hospital course which could be secondary to problem #1 and problem #2.  Cardiology following and plan cardiac cath at some point. ?  Next week. She is currently euvolemic and satting well on 2 L nasal  cannula.  Continue aspirin//statin/Zetia/beta-blockers (as tolerated by blood pressure)  4.  Acute combined systolic and diastolic CHF: Patient has new drop in EF to 15-20%.   BNP was elevated at 3300 and CT findings suggested some pulmonary edema.She received IV diuretics initially but now on hold due to hypotension.  Clinically appears euvolemic.  Patient was on Entresto at home but currently hypotension limits its usage.  5.  New onset atrial fibrillation: Unable to tolerate beta-blockers due to hypotension and anticoagulation due to anemia of unclear source.  Currently heart rate in the 90s.  6. COPD: Stable, no wheezing.?  Off steroids now.  Leukocytosis could be secondary to recent use of steroids.On abx for PNA  7.  AKI: Patient's creatinine was normal on presentation but peaked to 1.6 in the setting of diuresis.  Now improved off diuretics to 1.28.  Also noted to be hyponatremic, could be secondary to hypovolemia/dehydration and overdiuresis.  Will give mild hydration and repeat labs  DVT prophylaxis: SCD Code Status: Full code Family / Patient Communication: Discussed with patient and family bedside Disposition Plan: Home versus short-term rehab     LOS: 6 days    Time spent: 40 minutes    Guilford Shi, MD [facility] Pager [phone]  If 7PM-7AM, please contact night-coverage [email or web address] Password [facility] 04/01/2018, 11:47 AM

## 2018-03-14 NOTE — Anesthesia Procedure Notes (Signed)
Procedure Name: Intubation Date/Time: 03/27/2018 9:24 PM Performed by: Myna Bright, CRNA Pre-anesthesia Checklist: Patient identified, Emergency Drugs available, Suction available and Patient being monitored Patient Re-evaluated:Patient Re-evaluated prior to induction Oxygen Delivery Method: Circle system utilized Preoxygenation: Pre-oxygenation with 100% oxygen Induction Type: IV induction, Rapid sequence and Cricoid Pressure applied Laryngoscope Size: Mac and 3 Grade View: Grade II Tube type: Oral Tube size: 7.0 mm Number of attempts: 1 Airway Equipment and Method: Stylet Placement Confirmation: ETT inserted through vocal cords under direct vision,  positive ETCO2 and breath sounds checked- equal and bilateral Secured at: 22 cm Tube secured with: Tape Dental Injury: Teeth and Oropharynx as per pre-operative assessment

## 2018-03-14 NOTE — Progress Notes (Signed)
Upon arrival in ICU, groin site dressing was saturated with blood.  V-pad and dressing removed.  No hematoma noted.  Pressure applied with new v-pad for additional 20 minutes.  After evaluation of site with bedside RN, new Tegaderm, gauze and pressure dressing applied to site.

## 2018-03-14 NOTE — Progress Notes (Addendum)
Progress Note  Patient Name: Kelly Benjamin Date of Encounter: 04/01/2018  Primary Cardiologist: Quay Burow, MD   Subjective   No significant overnight events. Patient looks better today. She was sitting in a chair eating lunch when I entered the room. Breathing continues to slowly improve. No chest pain.  Inpatient Medications    Scheduled Meds: . aspirin EC  81 mg Oral Daily  . atorvastatin  40 mg Oral QHS  . calcium carbonate  1 tablet Oral Daily  . ezetimibe  10 mg Oral Daily  . fluticasone furoate-vilanterol  1 puff Inhalation Daily  . ipratropium-albuterol  3 mL Nebulization TID  . metoprolol succinate  25 mg Oral Daily  . multivitamin  1 tablet Oral BID  . pantoprazole  40 mg Oral Daily  . sodium bicarbonate  650 mg Oral TID  . sodium chloride flush  3 mL Intravenous Q12H   Continuous Infusions: . sodium chloride 10 mL/hr at 03/12/18 0534  . ceFEPime (MAXIPIME) IV 2 g (03/13/18 2131)  . vancomycin 1,000 mg (03/13/18 2133)   PRN Meds: sodium chloride, acetaminophen, calcium carbonate, LORazepam, ondansetron (ZOFRAN) IV, sodium chloride flush   Vital Signs    Vitals:   03/22/2018 0300 03/12/2018 0400 03/15/2018 0435 04/03/2018 0852  BP:   (!) 92/50   Pulse: 93 (!) 104 94   Resp: (!) 29 (!) 25 20   Temp:   97.7 F (36.5 C)   TempSrc:   Oral   SpO2: 97% 97% 96% 96%  Weight:      Height:        Intake/Output Summary (Last 24 hours) at 04/02/2018 1152 Last data filed at 03/19/2018 1041 Gross per 24 hour  Intake 303 ml  Output 750 ml  Net -447 ml   Filed Weights   03/20/2018 1703 03/09/18 0601 04/03/2018 0300  Weight: 67.1 kg 68 kg 69.3 kg    Telemetry    Atrial fibrillation with ventricular rates in the 90's to 110's with multiple PVCs. - Personally Reviewed  Physical Exam   GEN: Elderly Caucasian female resting comfortably in no acute distress.  Neck: Supple. Cardiac: Mildly tachycardic with irregularly irregular rhythm. No murmurs, rubs, or gallops.    Respiratory: Mild increased work of breathing. Course crackles in bilateral bases. Slowly improving. GI: Abdomen soft, non-distended, and non-tender to palpation. Bowel sounds present. MS: Trace edema of bilateral lower extremities.  Skin: Warm and dry. Neuro:  No focal deficits. Psych: Normal affect.  Labs    Chemistry Recent Labs  Lab 03/11/2018 0400 03/11/18 0515 03/12/18 0518 03/13/18 0243 03/13/2018 0354  NA 135 135  --  128* 127*  K 3.9 3.9  --  3.9 3.8  CL 105 103  --  99 98  CO2 15* 18*  --  20* 21*  GLUCOSE 140* 129*  --  122* 128*  BUN 32* 41*  --  53* 48*  CREATININE 1.59* 1.61*  --  1.57* 1.28*  CALCIUM 9.2 8.9  --  8.3* 8.0*  PROT 6.2*  --  5.3*  --   --   ALBUMIN 2.4*  --  2.0*  --   --   AST 214*  --  68*  --   --   ALT 172*  --  136*  --   --   ALKPHOS 79  --  435*  --   --   BILITOT 1.1  --  0.9  --   --   GFRNONAA 31* 31*  --  32* 41*  GFRAA 36* 36*  --  37* 47*  ANIONGAP 15 14  --  9 8     Hematology Recent Labs  Lab 03/19/2018 2129 03/13/18 0243 04/04/2018 0354  WBC 19.9* 14.8* 15.4*  RBC 3.19* 3.11* 3.16*  HGB 8.0* 8.0* 7.9*  HCT 26.1* 25.8* 26.2*  MCV 81.8 83.0 82.9  MCH 25.1* 25.7* 25.0*  MCHC 30.7 31.0 30.2  RDW 18.5* 20.1* 19.9*  PLT 308 277 249    Cardiac Enzymes Recent Labs  Lab 03/31/2018 1641 03/09/18 0341 03/09/18 0945 03/09/18 1557  TROPONINI 0.73* 1.08* 1.35* 1.30*    Recent Labs  Lab 03/22/2018 1008  TROPIPOC 0.36*     BNP Recent Labs  Lab 03/07/2018 0957  BNP 3,356.1*     DDimer No results for input(s): DDIMER in the last 168 hours.   Radiology    No results found.  Cardiac Studies   Echocardiogram 06/20/2016: Study Conclusions: - Left ventricle: The cavity size was normal. There was mild   concentric hypertrophy. Systolic function was mildly to   moderately reduced. The estimated ejection fraction was in the   range of 40% to 45%. Hypokinesis of the anterior and   anterolateral with akinesis of the  inferolateral myocardium.   Doppler parameters are consistent with abnormal left ventricular   relaxation (grade 1 diastolic dysfunction). Doppler parameters   are consistent with indeterminate ventricular filling pressure. - Aortic valve: Transvalvular velocity was within the normal range.   There was no stenosis. There was no regurgitation. - Mitral valve: Transvalvular velocity was within the normal range.   There was no evidence for stenosis. There was mild regurgitation. - Right ventricle: Systolic function was normal. - Atrial septum: No defect or patent foramen ovale was identified   by color flow Doppler. - Tricuspid valve: There was trivial regurgitation. - Pulmonary arteries: Systolic pressure was within the normal   range. PA peak pressure: 21 mm Hg (S). _______________  Left Heart Catheterization 03/02/2016:  Prox RCA to Mid RCA lesion, 90 %stenosed.  Dist LAD lesion, 85 %stenosed.    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 The right coronary is a very large territory and also has left right collaterals from the left coronary system. The likely scenario is the patient's inferolateral infarction occurred due to thrombotic occlusion and she subsequently had on pain is recanalization.  Widely patent circumflex.  Diffuse 85-90% apical LAD disease.  Elevated left ventricular end-diastolic pressure. Previously documented LVEF in the 30-35% range by noninvasive imaging.  Recommendations:  In absence of angina or ischemia on nuclear scintigraphy, medical therapy would appear to be the treatment of choice.  LV dysfunction is likely secondary to chronic remodeling following inferolateral infarction which occurred remotely.  Guideline mandated heart failure therapy. Discontinuation amlodipine will allow further up titration of heart failure agents.  Patient Profile   Ms. Declercq is a 78 y.o. female with a history  of of CAD, ischemic cardiomyopathy with EF of 40-45% on Echo in 06/2016, hyperlipidemia, COPD, and left breast cancer s/p radiation and lumpectomy who presented to the [facility] ED on 03/08/2017 for evaluation of worsening shortness of breath and edema. On arrival, she was found to have profound anemia with a hemoglobin of 5.6. Chest x-ray consistent with left upper lobe pneumonia. Troponin also elevated. Cardiology was consulted for acute on chronic systolic CHF and NSTEMI.  Assessment & Plan    Acute Hypoxemic Respiratory Failure  - Likely  multifactorial. Currently being treated for pneumonia and COPD exacerbation. However, exam and labs also consistent with acute on chronic CHF. - Chest x-ray on 03/11/2018 showed improved aeration in the right lung base with stable masslike infiltrate in the left upper lobe. - Per primary team.  Acute Combined Systolic and Diastolic CHF  - Cardiomyopathy may be ischemic with known CAD and newly reduced EF. - BNP elevated at 3,356.1. - Echo showed LVEF of 15-20% (down from 40-45% in 06/2016) with diffuse hypokinesis and grade 2 diastolic dysfunction.   - Net positive 3 L this admission; however, unsure if this is accurate. Diuresis has been limited by hypotension. - Would like to try to give PO Lasix today but with systolic BP currently in the 80's, will hold at this time. - Continue beta-blocker as BP allows.  - No ACEi/ARB/ARNI due to hypotension.  New Onset Atrial Fibrillation  - Telemetry shows atrial fibrillation with ventricular rates in the 90's to 110's. Suspect this is secondary to acute illnesses. - Will hold anticoagulation at this time due to acute anemia of unknown etiology.   Elevated Troponin with Known CAD - Troponin peaked at 1.35. - Continue Aspirin.  - Continue Lipitor and Zetia. - Continue low dose beta-blocker as BP allows. - Will hold Imdur given borderline BP. - Discussed with MD - may not proceed with inpatient ischemic evaluation  this admission given time from onset. May defer to primary Cardiologist as an outpatient.  Acute Anemia of Unknown Etiology - Hemoglobin 5.6 on admission (11.3 in 02/2016).  - Patient received 2 units of PRBC.  - Hemoglobin stable at 7.9 today. - GI is following patient. Per GI note, "EGD can be performed if her oxygen requirement drop down to 2 liters or less on nasal canula."  Community Acquired Pneumonia - Continue antibiotics per primary team.    For questions or updates, please contact [place] Please consult [email or web address] for contact info under        Signed, Darreld Mclean, PA-C  03/31/2018, 11:52 AM   ---------------------------------------------------------------------------------------------   History and all data above reviewed.  Patient examined.  I agree with the findings as above.  CARIA TRANSUE looks and feels better today, however remains hypotensive.  She remains in atrial fibrillation with rates around 100 bpm.  Constitutional: No acute distress Cardiovascular: Irregular rhythm, tachycardiac, no murmurs. S1 and S2 normal. Radial pulses normal bilaterally. No jugular venous distention.  Respiratory: clear to auscultation bilaterally GI : normal bowel sounds, soft and nontender. No distention.   MSK: extremities warm, well perfused. No edema.  NEURO: grossly nonfocal exam, moves all extremities. PSYCH: alert and oriented x 3, normal mood and affect.   All available labs, radiology testing, previous records reviewed. Agree with documented assessment and plan of my colleague as stated above with the following additions or changes:  Principal Problem:   PNA (pneumonia) Active Problems:   COPD (chronic obstructive pulmonary disease) (HCC)   SOB (shortness of breath)   Ischemic cardiomyopathy   Normocytic anemia   NSTEMI (non-ST elevated myocardial infarction) (HCC)   Abnormal CXR   Atrial fibrillation (HCC)   Acute combined systolic and diastolic  heart failure ([place])   Acute respiratory distress    Plan: She remains hypotensive, and her primary team would like to give her gentle hydration today.  We can certainly attempt this, however she has a very narrow fluid window, and we should watch carefully for volume overload, particularly because she is net positive during  this admission.  We have had significant difficulty maintaining diuresis due to hypotension and an acute decompensation several days ago.  Perhaps her net positive status is related to ins and outs that were not strictly recorded.  She is an appropriate amount of urine output today, and we will monitor how that is impacted by hydration.  She has not been weighed in several days and this may help [place] better understand her overall volume status.  She did receive fluids with her IV antibiotics, and and I am not sure that she is intravascularly deplete.  Length of Stay:  LOS: 6 days   Elouise Munroe, MD [facility] 1:53 PM  03/28/2018

## 2018-03-14 NOTE — Care Management Important Message (Signed)
Important Message  Patient Details  Name: Kelly Benjamin MRN: [id number] Date of Birth: 05/25/1940   Medicare Important Message Given:  Yes    Oni Dietzman P [place] 03/22/2018, 11:49 AM

## 2018-03-14 NOTE — Anesthesia Preprocedure Evaluation (Addendum)
Anesthesia Evaluation  Patient identified by MRN, date of birth, ID band Patient unresponsive    Reviewed: Allergy & Precautions, Patient's Chart, lab work & pertinent test results  Airway Mallampati: II  TM Distance: >3 FB Neck ROM: Full    Dental  (+) Missing, Poor Dentition   Pulmonary COPD, former smoker,    + rhonchi        Cardiovascular hypertension, Pt. on medications and Pt. on home beta blockers + CAD, + Past MI and +CHF  + dysrhythmias Atrial Fibrillation  Rhythm:Irregular Rate:Tachycardia  Study Conclusions  - Left ventricle: The cavity size was normal. Wall thickness was increased in a pattern of mild LVH. Systolic function was severely reduced. The estimated ejection fraction was in the range of 15% to 20%. Diffuse hypokinesis. Features are consistent with a pseudonormal left ventricular filling pattern, with concomitant abnormal relaxation and increased filling pressure (grade 2 diastolic dysfunction). Doppler parameters are consistent with high ventricular filling pressure. - Aortic valve: Trileaflet; mildly thickened, mildly calcified leaflets. - Mitral valve: There was moderate regurgitation. - Left atrium: The atrium was severely dilated. - Right ventricle: Systolic function was moderately reduced. - Right atrium: The atrium was severely dilated. - Tricuspid valve: There was moderate regurgitation. - Pulmonary arteries: Systolic pressure was mildly increased. PA peak pressure: 41 mm Hg (S).  Impressions:  - EF has reduced from prior study (40-45%)     Neuro/Psych negative neurological ROS     GI/Hepatic negative GI ROS, Neg liver ROS,   Endo/Other  negative endocrine ROS  Renal/GU negative Renal ROS     Musculoskeletal negative musculoskeletal ROS (+)   Abdominal   Peds  Hematology negative hematology ROS (+) anemia ,   Anesthesia Other Findings Day of surgery medications reviewed  with the patient.  Reproductive/Obstetrics                            Anesthesia Physical Anesthesia Plan  ASA: IV and emergent  Anesthesia Plan: General   Post-op Pain Management:    Induction: Intravenous  PONV Risk Score and Plan:   Airway Management Planned: Oral ETT  Additional Equipment: Arterial line  Intra-op Plan:   Post-operative Plan: Post-operative intubation/ventilation  Informed Consent: I have reviewed the patients History and Physical, chart, labs and discussed the procedure including the risks, benefits and alternatives for the proposed anesthesia with the patient or authorized representative who has indicated his/her understanding and acceptance.     Plan Discussed with: CRNA, Anesthesiologist and Surgeon  Anesthesia Plan Comments:        Anesthesia Quick Evaluation

## 2018-03-14 NOTE — Transfer of Care (Signed)
Immediate Anesthesia Transfer of Care Note  Patient: Kelly Benjamin  Procedure(s) Performed: RADIOLOGY WITH ANESTHESIA (N/A )  Patient Location: ICU  Anesthesia Type:General  Level of Consciousness: Patient remains intubated per anesthesia plan  Airway & Oxygen Therapy: Patient remains intubated per anesthesia plan and Patient placed on Ventilator (see vital sign flow sheet for setting)  Post-op Assessment: Report given to RN and Post -op Vital signs reviewed and stable  Post vital signs: Reviewed and stable  Last Vitals:  Vitals Value Taken Time  BP 107/72 03/31/2018 11:30 PM  Temp    Pulse 104 04/02/2018 11:31 PM  Resp 14 03/28/2018 11:31 PM  SpO2 100 % 03/05/2018 11:31 PM  Vitals shown include unvalidated device data.  Last Pain:  Vitals:   03/30/2018 2307  TempSrc: Oral  PainSc:       Patients Stated Pain Goal: 0 (00/93/81 8299)  Complications: No apparent anesthesia complications

## 2018-03-14 NOTE — Progress Notes (Signed)
Patient ID: Kelly Benjamin, female   DOB: 07-25-1940, 78 y.o.   MRN: [id number] Post procedure CT brain  No gross ICH,mass effect or shift noted. Patient left intubated because of bilateral pleural effusions and  Cardiac EF of 10 to 15 %. RT groin soft. Distal pulses all doppl;erable unchanged. S.Hilman Kissling MD

## 2018-03-14 NOTE — Consult Note (Addendum)
Neurology Consultation Reason for Consult: Left-sided weakness Referring Physician: Code stroke  CC: Left-sided weakness  History is obtained from: Patient  HPI: Kelly Benjamin is a 78 y.o. female with a history of ischemic cardiomyopathy who was admitted on 1/4 after presenting with shortness of breath and being found to have pneumonia, severe symptomatic anemia with hemoglobin of 5.6, NSTEMI with mild elevation in troponin, new onset atrial fibrillation.  She was started on cefepime and seen by gastroenterology who is planning on doing endoscopy, but has not done that yet.   She was in her normal state of mental status until 6:45 PM.  Following this, the nurse came back and checked on her and found her not moving her left side, a code stroke was activated approximately 5 minutes later.  I discussed with her power of attorney, and she is independent at baseline, taking care of herself.  LKW: 6:45 PM tpa given?: no, likely recent GI bleeding Premorbid modified rankin scale: 0 NIHSS prior to procedure: 18  ROS: Unable to obtain due to altered mental status.  Past Medical History:  Diagnosis Date  . Breast cancer ([place]) 09/2017   left  . CAD in native artery 02/28/2016   2V CAD -medical rx  . COPD (chronic obstructive pulmonary disease) ([place])   . Dyslipidemia, goal LDL below 70   . Hypertension   . Ischemic cardiomyopathy    EF improved to 40-45% on Entresto  . Personal history of radiation therapy      Family History  Problem Relation Age of Onset  . Heart attack Father      Social History:  reports that she has quit smoking. She has quit using smokeless tobacco. She reports current alcohol use. She reports that she does not use drugs.   Exam: Current vital signs: BP (!) 92/50 (BP Location: Right Arm)   Pulse 98   Temp 97.7 F (36.5 C) (Oral)   Resp (!) 22   Ht 5\' 10"  (1.778 m)   Wt 69.3 kg   SpO2 97%   BMI 21.92 kg/m  Vital signs in last 24 hours: Temp:   [97.4 F (36.3 C)-97.7 F (36.5 C)] 97.7 F (36.5 C) (01/10 0435) Pulse Rate:  [87-106] 98 (01/10 1700) Resp:  [20-35] 22 (01/10 1700) BP: (92-93)/(50-53) 92/50 (01/10 0435) SpO2:  [94 %-99 %] 97 % (01/10 1958)   Physical Exam  Constitutional: Appears well-developed and well-nourished.  Psych: Affect appropriate to situation Eyes: No scleral injection HENT: No OP obstrucion Head: Normocephalic.  Cardiovascular: Normal rate and regular rhythm.  Respiratory: Effort normal, non-labored breathing GI: Soft.  No distension. There is no tenderness.  Skin: WDI  Neuro: Mental Status: Patient is awake, alert, she is severely dysarthric.  She is able to give me the month of January, but gives her age is 41 instead of 70 Cranial Nerves: II: She has a dense left hemianopia.  Pupils are equal, round, and reactive to light.   III,IV, VI: She does not cross midline to the left, but does come to midline V: Facial sensation is diminished on the left VII: Facial movement is notable for left facial weakness XII: tongue deviates to the left  motor: She has severe left sided weakness, unable to lift against gravity in either the leg or the arm.  She has extensor to pain in the left arm, 1/5 left leg Sensory: She does not sense being pinched on the left side Cerebellar: No clear ataxia   I have reviewed  labs in epic and the results pertinent to this consultation are: Hyponatremia with sodium 127 AKI with creatinine of 1.28  I have reviewed the images obtained: CTA- right T occlusion  Impression: 78 year old female with acute LVO likely due to atrial fibrillation without anticoagulation.  Given that she was anemic, and there was a potential for blood loss with the procedure, I did transfuse 2 units.  Unfortunately, she does not have great collateralization, but given how soon into the symptoms were she was taken for IR thrombectomy.  Recommendations: 1) mechanical thrombectomy 2) LDL, A1c 3)  transfuse 2 units PRBC 4) appreciate CCM consultation   This patient is critically ill and at significant risk of neurological worsening, death and care requires constant monitoring of vital signs, hemodynamics,respiratory and cardiac monitoring, neurological assessment, discussion with family, other specialists and medical decision making of high complexity. I spent 50 minutes of neurocritical care time  in the care of  this patient. This was time spent independent of any time provided by nurse practitioner or PA.  Roland Rack, MD [facility] [phone]  If 7pm- 7am, please page neurology on call as listed in [place]. 03/15/2018  2:10 AM

## 2018-03-14 NOTE — Procedures (Signed)
S/P Rt common carotid arteriogram RT CFA approach. Findings. 1.Occluded RT ICA terminus,Rt MCA and RT ACA . S/P complete revascularization of RT ICA  T occlusion  With x 2 passes with 74mm x 68mm embotrap retriever device acieving a TICI 3 revascularization.

## 2018-03-15 ENCOUNTER — Inpatient Hospital Stay (HOSPITAL_COMMUNITY): Payer: Medicare PPO

## 2018-03-15 DIAGNOSIS — I63411 Cerebral infarction due to embolism of right middle cerebral artery: Secondary | ICD-10-CM

## 2018-03-15 LAB — CBC WITH DIFFERENTIAL/PLATELET
Abs Immature Granulocytes: 0.36 10*3/uL — ABNORMAL HIGH (ref 0.00–0.07)
Basophils Absolute: 0 10*3/uL (ref 0.0–0.1)
Basophils Relative: 0 %
Eosinophils Absolute: 0.5 10*3/uL (ref 0.0–0.5)
Eosinophils Relative: 3 %
HCT: 32 % — ABNORMAL LOW (ref 36.0–46.0)
Hemoglobin: 9.8 g/dL — ABNORMAL LOW (ref 12.0–15.0)
Immature Granulocytes: 2 %
Lymphocytes Relative: 5 %
Lymphs Abs: 0.9 10*3/uL (ref 0.7–4.0)
MCH: 25.7 pg — ABNORMAL LOW (ref 26.0–34.0)
MCHC: 30.6 g/dL (ref 30.0–36.0)
MCV: 84 fL (ref 80.0–100.0)
Monocytes Absolute: 1 10*3/uL (ref 0.1–1.0)
Monocytes Relative: 6 %
Neutro Abs: 14.2 10*3/uL — ABNORMAL HIGH (ref 1.7–7.7)
Neutrophils Relative %: 84 %
Platelets: 301 10*3/uL (ref 150–400)
RBC: 3.81 MIL/uL — ABNORMAL LOW (ref 3.87–5.11)
RDW: 18.7 % — ABNORMAL HIGH (ref 11.5–15.5)
WBC: 17 10*3/uL — ABNORMAL HIGH (ref 4.0–10.5)
nRBC: 0.8 % — ABNORMAL HIGH (ref 0.0–0.2)

## 2018-03-15 LAB — BASIC METABOLIC PANEL
Anion gap: 10 (ref 5–15)
BUN: 35 mg/dL — ABNORMAL HIGH (ref 8–23)
CO2: 18 mmol/L — ABNORMAL LOW (ref 22–32)
Calcium: 7.9 mg/dL — ABNORMAL LOW (ref 8.9–10.3)
Chloride: 105 mmol/L (ref 98–111)
Creatinine, Ser: 0.88 mg/dL (ref 0.44–1.00)
GFR calc Af Amer: >60 mL/min (ref >60)
GFR calc non Af Amer: >60 mL/min (ref >60)
Glucose, Bld: 101 mg/dL — ABNORMAL HIGH (ref 70–99)
Potassium: 4 mmol/L (ref 3.5–5.1)
Sodium: 133 mmol/L — ABNORMAL LOW (ref 135–145)

## 2018-03-15 LAB — BLOOD GAS, ARTERIAL
Acid-base deficit: 4.9 mmol/L — ABNORMAL HIGH (ref 0.0–2.0)
Bicarbonate: 19.5 mmol/L — ABNORMAL LOW (ref 20.0–28.0)
Drawn by: 414221
FIO2: 40
MECHVT: 550 mL
O2 Saturation: 97.4 %
PEEP: 5 cmH2O
Patient temperature: 97.6
RATE: 14 resp/min
pCO2 arterial: 33.7 mmHg (ref 32.0–48.0)
pH, Arterial: 7.377 (ref 7.350–7.450)
pO2, Arterial: 104 mmHg (ref 83.0–108.0)

## 2018-03-15 LAB — HEMOGLOBIN A1C
Hgb A1c MFr Bld: 6.1 % — ABNORMAL HIGH (ref 4.8–5.6)
Mean Plasma Glucose: 128.37 mg/dL

## 2018-03-15 LAB — LIPID PANEL
Cholesterol: 71 mg/dL (ref 0–200)
HDL: 26 mg/dL — ABNORMAL LOW (ref >40)
LDL CALC: 32 mg/dL (ref 0–99)
Total CHOL/HDL Ratio: 2.7 RATIO
Triglycerides: 64 mg/dL (ref <150)
VLDL: 13 mg/dL (ref 0–40)

## 2018-03-15 LAB — MRSA PCR SCREENING: MRSA by PCR: NEGATIVE

## 2018-03-15 LAB — PHOSPHORUS: Phosphorus: 3.1 mg/dL (ref 2.5–4.6)

## 2018-03-15 MED ORDER — ATORVASTATIN CALCIUM 10 MG PO TABS
20.0000 mg | ORAL_TABLET | Freq: Every day | ORAL | Status: DC
  Administered 2018-03-15 – 2018-03-19 (×4): 20 mg via ORAL
  Filled 2018-03-15 (×4): qty 2

## 2018-03-15 MED ORDER — NOREPINEPHRINE-SODIUM CHLORIDE 4-0.9 MG/250ML-% IV SOLN
0.0000 ug/min | INTRAVENOUS | Status: DC
  Administered 2018-03-15: 2 ug/min via INTRAVENOUS
  Administered 2018-03-15: 9 ug/min via INTRAVENOUS
  Administered 2018-03-16: 5 ug/min via INTRAVENOUS
  Filled 2018-03-15 (×3): qty 250

## 2018-03-15 MED ORDER — ASPIRIN EC 325 MG PO TBEC: 325.0000 mg | DELAYED_RELEASE_TABLET | Freq: Every day | ORAL | Status: DC

## 2018-03-15 MED ORDER — SODIUM CHLORIDE 0.9 % IV SOLN
2.0000 g | Freq: Two times a day (BID) | INTRAVENOUS | Status: DC
  Administered 2018-03-15 – 2018-03-16 (×2): 2 g via INTRAVENOUS
  Filled 2018-03-15 (×3): qty 2

## 2018-03-15 NOTE — Progress Notes (Signed)
Pharmacy Antibiotic Note  Kelly Benjamin is a 78 y.o. female admitted on 03/13/2018 with pneumonia.  Pharmacy has been consulted for cefepime dosing. Pt is afebrile but WBC remains elevated at 17. SCr has improved to 0.88.   Plan: Change cefepime to 2gm IV Q12H to complete course F/u renal fxn, C&S, clinical status   Height: 5\' 10"  (177.8 cm) Weight: 152 lb 12.5 oz (69.3 kg) IBW/kg (Calculated) : 68.5  Temp (24hrs), Avg:97.7 F (36.5 C), Min:97 F (36.1 C), Max:99.1 F (37.3 C)  Recent Labs  Lab 03/09/18 2335 03/12/2018 0400 03/18/2018 1906 03/22/2018 2129 03/11/18 0515 03/13/18 0243 03/14/18 0354 03/15/18 0602 03/15/18 0920  WBC 14.0* 16.3*  --  19.9*  --  14.8* 15.4* 17.0*  --   CREATININE 1.45* 1.59*  --   --  1.61* 1.57* 1.28*  --  0.88  LATICACIDVEN 3.3* 2.2* 5.6* 2.7* 1.4  --   --   --   --     Estimated Creatinine Clearance: 58.8 mL/min (by C-G formula based on SCr of 0.88 mg/dL).    Allergies  Allergen Reactions  . Sulfa Antibiotics Swelling    azithromycin 1/4>>1/8 Vancomycin 1/6>> 1/10 Cefepime 1/6>>(1/12)  1/4BCx: Neg 1/11 TA - pending  Thank you for allowing pharmacy to be a part of this patient's care.  Salome Arnt, PharmD, BCPS Please see AMION for all pharmacy numbers 03/15/2018 2:26 PM

## 2018-03-15 NOTE — Progress Notes (Signed)
eLink Physician-Brief Progress Note Patient Name: SHANNIE KONTOS DOB: 1940-08-28 MRN: [id number]   Date of Service  03/15/2018  HPI/Events of Note  77/F with ischemic cardiomyopathy, treated for pneumonia, symptomatic anemia with hgb 5.6, NSTEMI, new onset atrial fibrillation, who was transferred from the floors, after she was noted to have acute symptoms of CVA.  She is s/p right common carotid arteriogram and found to have occluded right ICA terminus, right MGA and right ACA.  She is now s/p mechanical thrombectomy.  She was kept intubated and transferred to the ICU. She is sedated.  eICU Interventions  Continue current mech vent settings. SBT in the morning. Complete antibiotics. Goal SBP 120-140. SCDS.  PPI for GI prophylaxis.     Intervention Category Evaluation Type: New Patient Evaluation  Elsie Lincoln 03/15/2018, 12:42 AM

## 2018-03-15 NOTE — Progress Notes (Signed)
PROGRESS NOTE    Kelly Benjamin  ULA:[id number] DOB: 09/07/1940 DOA: 03/06/2018 PCP: Maurice Small, MD  Brief Narrative:  78 year old woman with history of CAD, ischemic cardiomyopathy, HFrEF (EF 40-45% in 2018 and now 15-20% in 2020), COPD, HTN admitted on 03/19/2018 with dyspnea on exertion and BLE edema found to have left upper lobe pneumonia, acute symptomatic anemia (hgb 5.6), NSTEMI, and new onset atrial fibrillation. Initially required BiPAP. She was given Rocephin and azithromycin but this was transitioned to vanc/cefepime. She was diuresed during the hospital course. She developed new onset left sided weakness and gaze deviation. CTA noted total occlusion of right proximal M1 segment without collaterals. Underwent right common carotid arteriogram and found to have occluded right ICA terminus, right MCA and right ACA. She underwent thrombectomy with IR 03/29/2018.   Significant Hospital Events   1/4> admit, working diagnosis of CAP, complicated by new onset atrial fibrillation, non-ST elevation MI and new heart failure 1/10> thrombectomy, transfer to ICU 1/11: Remains hemi-paretic on the left side, weaning.  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  MRI ordered by neurology. Aspirin, statins per neurology. Follow-up MRI Serial neuro checks Rehab focus  Acute hypoxic respiratory failure, CAP with risk factors for pseudomonas: 03/15/2018: CXR showing  endotracheal tube in satisfactory position, bibasilar atelectasis, slight improvement in left upper lobe consolidation. -Patient remains intubated.  On cefepime for suspected pneumonia.  Acute kidney injury 03/15/2018: Improving with IV hydration.  Hold diuresis due to soft blood pressure.   Non-anion gap metabolic acidosis Continue to monitor.  COPD: Continue Brovana and Pulmicort, DuoNeb scheduled  Hypotension 03/15/2018: Exact etiology unclear. Holding ACE inhibitor and ARB  NSTEMI:Patient had troponin peaked to 1.35 on 1/5. 03/15/2018: Per cardiology absence of angina or ischemia on nuclear scintigraphy, recommend medical therapy. Continue aspirin statin.  Acute combined systolic and diastolic CHF:  Holding diuresis and Lopressor at this time given hypotension.  Cardiology following.  New onset atrial fibrillation Patient on pressors therefore no rate lowering treatment. Holding anticoagulation this time did have symptomatic anemia  Acute symptomatic anemia: Patient's baseline hemoglobin is around 11. She is status post 2 units PRBCs. Seen by GI in past. Considering EGD in the future On pantoprazole Continue to monitor, transfuse for hemoglobin less than 7  Transaminitis Monitor LFTs   DVT prophylaxis: SCD Code Status: Full Family Communication: No family at bedside Disposition Plan: Unknown at this time   Consultants:   Cardiology  Procedures:  Acute Right ICA Occlusion s/p IR Embolectomy 1/10  Antimicrobials:   Cefepime    Subjective: Patient intubated, on pressors.  Objective: Vitals:   03/15/18 1142 03/15/18 1145 03/15/18 1200 03/15/18 1430  BP: (!) 117/49 (!) 112/53 (!) 110/53   Pulse: 95 83 90   Resp: _0 Temp:   99.1 F (37.3 C)   TempSrc:   Axillary   SpO2: 100% 100% 100% 100%  Weight:      Height:  Intake/Output Summary (Last 24 hours) at 03/15/2018 1441 Last data filed at 03/15/2018 1200 Gross per 24 hour  Intake  4931.64 ml  Output 2000 ml  Net 2931.64 ml   Filed Weights   03/09/18 0601 03/06/2018 0300 03/15/18 0500  Weight: 68 kg 69.3 kg 69.3 kg    Examination:  General exam: Intubated, on pressors Respiratory system: Bilateral rhonchi Cardiovascular system: Irregularly irregular  Gastrointestinal system: Abdomen is nondistended, soft. No organomegaly or masses felt. Normal bowel sounds heard. Central nervous system: Intubated Extremities: Hemiparesis on right Skin: No rashes, lesions or ulcers   Data Reviewed: I have personally reviewed following labs and imaging studies  CBC: Recent Labs  Lab 03/09/18 0341  03/12/2018 0400 03/28/2018 2129 03/13/18 0243 03/06/2018 0354 03/15/18 0602  WBC 14.4*   < > 16.3* 19.9* 14.8* 15.4* 17.0*  NEUTROABS 12.9*  --   --   --   --   --  14.2*  HGB 8.3*   < > 8.5* 8.0* 8.0* 7.9* 9.8*  HCT 27.2*   < > 27.8* 26.1* 25.8* 26.2* 32.0*  MCV 82.7   < > 81.5 81.8 83.0 82.9 84.0  PLT 359   < > 320 308 277 249 301   < > = values in this interval not displayed.   Basic Metabolic Panel: Recent Labs  Lab 04/01/2018 0400 03/11/18 0515 03/13/18 0243 03/17/2018 0354 03/15/18 0602 03/15/18 0920  NA 135 135 128* 127*  --  133*  K 3.9 3.9 3.9 3.8  --  4.0  CL 105 103 99 98  --  105  CO2 15* 18* 20* 21*  --  18*  GLUCOSE 140* 129* 122* 128*  --  101*  BUN 32* 41* 53* 48*  --  35*  CREATININE 1.59* 1.61* 1.57* 1.28*  --  0.88  CALCIUM 9.2 8.9 8.3* 8.0*  --  7.9*  MG  --   --  2.2 2.3  --   --   PHOS  --   --   --   --  3.1  --    GFR: Estimated Creatinine Clearance: 58.8 mL/min (by C-G formula based on SCr of 0.88 mg/dL). Liver Function Tests: Recent Labs  Lab 03/15/2018 0400 03/12/18 0518  AST 214* 68*  ALT 172* 136*  ALKPHOS 79 435*  BILITOT 1.1 0.9  PROT 6.2* 5.3*  ALBUMIN 2.4* 2.0*   No results for input(s): LIPASE, AMYLASE in the last 168 hours. No results for input(s): AMMONIA in the last 168 hours. Coagulation Profile: Recent Labs  Lab  03/27/2018 2201  INR 1.35   Cardiac Enzymes: Recent Labs  Lab 03/19/2018 1641 03/09/18 0341 03/09/18 0945 03/09/18 1557  TROPONINI 0.73* 1.08* 1.35* 1.30*   BNP (last 3 results) No results for input(s): PROBNP in the last 8760 hours. HbA1C: Recent Labs    03/15/18 0602  HGBA1C 6.1*   CBG: Recent Labs  Lab 03/09/18 1437 03/18/2018 2024  GLUCAP 158* 133*   Lipid Profile: Recent Labs    03/15/18 0602  CHOL 71  HDL 26*  LDLCALC 32  TRIG 64  CHOLHDL 2.7   Thyroid Function Tests: No results for input(s): TSH, T4TOTAL, FREET4, T3FREE, THYROIDAB in the last 72 hours. Anemia Panel: No results for input(s): VITAMINB12, FOLATE, FERRITIN, TIBC, IRON, RETICCTPCT in the last 72 hours. Sepsis Labs: Recent Labs  Lab 03/13/2018 0400 03/12/2018 1906 03/13/2018 2129 03/11/18 0515 03/12/18 0518  PROCALCITON  --   --   --   --  1.14  LATICACIDVEN 2.2* 5.6* 2.7* 1.4  --     Recent Results (from the past 240 hour(s))  Culture, blood (routine x 2) Call MD if unable to obtain prior to antibiotics being given     Status: None   Collection Time: 04/03/2018  4:14 PM  Result Value Ref Range Status   Specimen Description BLOOD RIGHT ARM  Final   Special Requests   Final    BOTTLES DRAWN AEROBIC AND ANAEROBIC Blood Culture results may not be optimal due to an inadequate volume of blood received in culture bottles   Culture   Final    NO GROWTH 5 DAYS Performed at [facility], [place] [address], [place], [place] [address]    Report Status 03/13/2018 FINAL  Final  Culture, blood (routine x 2) Call MD if unable to obtain prior to antibiotics being given     Status: None   Collection Time: 03/09/2018  4:21 PM  Result Value Ref Range Status   Specimen Description BLOOD RIGHT HAND  Final   Special Requests   Final    AEROBIC BOTTLE ONLY Blood Culture results may not be optimal due to an inadequate volume of blood received in culture bottles   Culture   Final    NO GROWTH 5 DAYS Performed at  [facility], [place] [address] [place], [place] [address]    Report Status 03/13/2018 FINAL  Final  MRSA PCR Screening     Status: None   Collection Time: 03/30/2018 11:45 PM  Result Value Ref Range Status   MRSA by PCR NEGATIVE NEGATIVE Final    Comment:        The GeneXpert MRSA Assay (FDA approved for NASAL specimens only), is one component of a comprehensive MRSA colonization surveillance program. It is not intended to diagnose MRSA infection nor to guide or monitor treatment for MRSA infections. Performed at [facility], [place] [address] [place], [place] [address]   Culture, respiratory (non-expectorated)     Status: None (Preliminary result)   Collection Time: 03/15/18 11:48 AM  Result Value Ref Range Status   Specimen Description TRACHEAL ASPIRATE  Final   Special Requests NONE  Final   Gram Stain   Final    NO WBC SEEN NO ORGANISMS SEEN Performed at [facility], [place] [address], [place], [place] [address]    Culture PENDING  Incomplete   Report Status PENDING  Incomplete         Radiology Studies: Ct Angio Head W Or Wo Contrast  Result Date: 03/29/2018 CLINICAL DATA:  Acute onset of left-sided weakness 2 hours ago. EXAM: CT ANGIOGRAPHY HEAD AND NECK TECHNIQUE: Multidetector CT imaging of the head and neck was performed using the standard protocol during bolus administration of intravenous contrast. Multiplanar CT image reconstructions and MIPs were obtained to evaluate the vascular anatomy. Carotid stenosis measurements (when applicable) are obtained utilizing NASCET criteria, using the distal internal carotid diameter as the denominator. CONTRAST:  161m ISOVUE-370 IOPAMIDOL (ISOVUE-370) INJECTION 76% COMPARISON:  CT head without contrast 03/21/2018 FINDINGS: CTA NECK FINDINGS Aortic arch: Atherosclerotic calcifications are present at the aortic arch. There is no aneurysm. No significant stenosis is present at the great vessel origins. Right carotid  system: The right common carotid artery is tortuous with atherosclerotic changes throughout the course of the right common carotid artery. Calcifications extend through the bifurcation to the proximal right ICA without a significant stenosis relative to the more distal vessel. The cervical  right ICA is within normal limits. Left carotid system: Minimal atherosclerotic calcifications are present within the left common carotid artery. Dense calcifications are present at the left carotid bifurcation. There is no significant stenosis relative to the more distal vessel. Atherosclerotic changes extend over 3 cm from the bifurcation. The cervical left ICA is otherwise normal. Vertebral arteries: The proximal left vertebral artery demonstrates a high-grade stenosis with extensive calcification. Opacification of the more distal left vertebral artery may reflect retrograde flow. No definite lumen is seen proximally. The non dominant right vertebral artery origin is within normal limits. There is no significant stenosis to the right vertebral artery in the neck. Atherosclerotic changes are present at C1-2 on the left without a significant stenosis. Skeleton: Multilevel degenerative changes are present in the cervical spine, particularly facet disease. This results in slight anterolisthesis at C4-5. No focal lytic or blastic lesions are present. Other neck: The soft tissues the neck demonstrate no focal mucosal or submucosal lesions. Thyroid is within normal limits. No significant adenopathy is present. Salivary glands are normal. Upper chest: Increasing bilateral pleural effusions are present. Right upper lobe consolidated pneumonia is again seen. Mild edema is present. Review of the MIP images confirms the above findings CTA HEAD FINDINGS Anterior circulation: Atherosclerotic changes are present throughout the cavernous right internal carotid artery without a significant stenosis through the ICA terminus. Calcifications are  present in the left cavernous internal carotid artery without a significant stenosis. A proximal right M1 segment occlusion is present. Right A1 segment is patent. Left A1 and M1 segments are within normal limits. The left MCA bifurcation is intact. Left MCA branch vessels are within normal limits. There is mild distal small vessel disease of the ACA vessels. No significant right MCA collateralization is present. Posterior circulation: The left vertebral artery is the dominant vessel. The hypoplastic right vertebral artery essentially terminates at the PICA sending only very small branch to the vertebrobasilar junction. There is mild narrowing of the distal left V4 segment. Basilar artery is small. Both posterior cerebral arteries originate from the basilar tip. A small left posterior communicating artery is present. PCA branch vessels demonstrate mild irregularity without a significant proximal stenosis or occlusion. Venous sinuses: The dural sinuses are patent. Straight sinus and deep cerebral veins are intact. Cortical veins are unremarkable. Anatomic variants: None Review of the MIP images confirms the above findings IMPRESSION: 1. Emergent large vessel occlusion involving the proximal right M1 segment. 2. Very poor collateral flow to the right MCA distribution. 3. 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 4. Opacification of the left vertebral artery above the V1 segment may be retrograde flow. 5. Mild diffuse small vessel disease within the ACA branch vessels, left MCA branch vessels, and posterior circulation. 6. Extensive atherosclerotic calcification and vessel wall irregularity at the carotid bifurcations bilaterally without significant stenosis. 7. Extensive atherosclerotic changes within the cavernous internal carotid arteries bilaterally without significant stenosis. 8. Atherosclerotic changes at the aortic arch and particularly the left subclavian artery without  significant stenosis. The above was relayed via text pager to Dr. Roland Rack on 03/07/2018 at 20:55. Electronically Signed   By: San Morelle M.D.   On: 03/25/2018 21:17   Ct Angio Neck W Or Wo Contrast  Result Date: 03/05/2018 CLINICAL DATA:  Acute onset of left-sided weakness 2 hours ago. EXAM: CT ANGIOGRAPHY HEAD AND NECK TECHNIQUE: Multidetector CT imaging of the head and neck was performed using the standard protocol during bolus administration of intravenous contrast.  Multiplanar CT image reconstructions and MIPs were obtained to evaluate the vascular anatomy. Carotid stenosis measurements (when applicable) are obtained utilizing NASCET criteria, using the distal internal carotid diameter as the denominator. CONTRAST:  142m ISOVUE-370 IOPAMIDOL (ISOVUE-370) INJECTION 76% COMPARISON:  CT head without contrast 03/06/2018 FINDINGS: CTA NECK FINDINGS Aortic arch: Atherosclerotic calcifications are present at the aortic arch. There is no aneurysm. No significant stenosis is present at the great vessel origins. Right carotid system: The right common carotid artery is tortuous with atherosclerotic changes throughout the course of the right common carotid artery. Calcifications extend through the bifurcation to the proximal right ICA without a significant stenosis relative to the more distal vessel. The cervical right ICA is within normal limits. Left carotid system: Minimal atherosclerotic calcifications are present within the left common carotid artery. Dense calcifications are present at the left carotid bifurcation. There is no significant stenosis relative to the more distal vessel. Atherosclerotic changes extend over 3 cm from the bifurcation. The cervical left ICA is otherwise normal. Vertebral arteries: The proximal left vertebral artery demonstrates a high-grade stenosis with extensive calcification. Opacification of the more distal left vertebral artery may reflect retrograde flow. No  definite lumen is seen proximally. The non dominant right vertebral artery origin is within normal limits. There is no significant stenosis to the right vertebral artery in the neck. Atherosclerotic changes are present at C1-2 on the left without a significant stenosis. Skeleton: Multilevel degenerative changes are present in the cervical spine, particularly facet disease. This results in slight anterolisthesis at C4-5. No focal lytic or blastic lesions are present. Other neck: The soft tissues the neck demonstrate no focal mucosal or submucosal lesions. Thyroid is within normal limits. No significant adenopathy is present. Salivary glands are normal. Upper chest: Increasing bilateral pleural effusions are present. Right upper lobe consolidated pneumonia is again seen. Mild edema is present. Review of the MIP images confirms the above findings CTA HEAD FINDINGS Anterior circulation: Atherosclerotic changes are present throughout the cavernous right internal carotid artery without a significant stenosis through the ICA terminus. Calcifications are present in the left cavernous internal carotid artery without a significant stenosis. A proximal right M1 segment occlusion is present. Right A1 segment is patent. Left A1 and M1 segments are within normal limits. The left MCA bifurcation is intact. Left MCA branch vessels are within normal limits. There is mild distal small vessel disease of the ACA vessels. No significant right MCA collateralization is present. Posterior circulation: The left vertebral artery is the dominant vessel. The hypoplastic right vertebral artery essentially terminates at the PICA sending only very small branch to the vertebrobasilar junction. There is mild narrowing of the distal left V4 segment. Basilar artery is small. Both posterior cerebral arteries originate from the basilar tip. A small left posterior communicating artery is present. PCA branch vessels demonstrate mild irregularity without  a significant proximal stenosis or occlusion. Venous sinuses: The dural sinuses are patent. Straight sinus and deep cerebral veins are intact. Cortical veins are unremarkable. Anatomic variants: None Review of the MIP images confirms the above findings IMPRESSION: 1. Emergent large vessel occlusion involving the proximal right M1 segment. 2. Very poor collateral flow to the right MCA distribution. 3. 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 4. Opacification of the left vertebral artery above the V1 segment may be retrograde flow. 5. Mild diffuse small vessel disease within the ACA branch vessels, left MCA branch vessels, and posterior circulation. 6. Extensive atherosclerotic calcification and vessel wall  irregularity at the carotid bifurcations bilaterally without significant stenosis. 7. Extensive atherosclerotic changes within the cavernous internal carotid arteries bilaterally without significant stenosis. 8. Atherosclerotic changes at the aortic arch and particularly the left subclavian artery without significant stenosis. The above was relayed via text pager to Dr. Roland Rack on 03/20/2018 at 20:55. Electronically Signed   By: San Morelle M.D.   On: 03/17/2018 21:17   Dg Chest Port 1 View  Result Date: 03/15/2018 CLINICAL DATA:  Intubation EXAM: PORTABLE CHEST 1 VIEW COMPARISON:  03/11/2018 FINDINGS: Endotracheal tube placed with tip measuring 3.3 cm above the carina. Enteric tube placed. Tip is off the field of view but below the left hemidiaphragm. Cardiac enlargement. No vascular congestion. Left perihilar mass or consolidation with consolidation or atelectasis in the left lower lung as well. Similar appearance to previous study, lying for differences in technique. No pneumothorax. Calcification of the aorta. IMPRESSION: Appliances appear in satisfactory location. Cardiac enlargement. Left perihilar mass or consolidation with consolidation or  atelectasis in the left lower lung, unchanged. Electronically Signed   By: Lucienne Capers M.D.   On: 03/15/2018 00:16   Ct Head Code Stroke Wo Contrast  Result Date: 03/06/2018 CLINICAL DATA:  Code stroke. Left-sided weakness and slurred speech. EXAM: CT HEAD WITHOUT CONTRAST TECHNIQUE: Contiguous axial images were obtained from the base of the skull through the vertex without intravenous contrast. COMPARISON:  None. FINDINGS: Brain: Mild atrophy and white matter changes are within normal limits for age. No acute hemorrhage or mass lesion is present. There is decreased density of the right lentiform nucleus and insular ribbon. M1 and M2 gray-white differentiation is decreased. Super ganglionic cortex is within normal limits. Ventricles are of normal size. No significant extraaxial fluid collection is present. Remote lacunar infarcts are present in the right cerebellum. Brainstem is within normal limits. Vascular: A hyperdense right MCA is present. Atherosclerotic calcifications are present bilaterally. Skull: Calvarium is intact. No focal lytic or blastic lesions are present. Sinuses/Orbits: The paranasal sinuses and mastoid air cells are clear. Bilateral lens replacements are noted. Globes and orbits are otherwise unremarkable. ASPECTS [facility] Stroke Program Early CT Score) - Ganglionic level infarction (caudate, lentiform nuclei, internal capsule, insula, M1-M3 cortex): 3/7 - Supraganglionic infarction (M4-M6 cortex): 3/3 Total score (0-10 with 10 being normal): 6/10 IMPRESSION: 1. Acute right MCA territory infarct with decreased density in the right lentiform nucleus, right insular cortex, and right M1 and M2 cortex. 2. Hyperdense right MCA suggesting acute thrombus. 3. ASPECTS is 6/10 The above was relayed via text pager to Dr. Roland Rack on 03/12/2018 at 20:55 . Electronically Signed   By: San Morelle M.D.   On: 04/04/2018 20:57        Scheduled Meds: .  stroke: mapping our early  stages of recovery book   Does not apply Once  . arformoterol  15 mcg Nebulization BID  . aspirin EC  325 mg Oral Daily  . atorvastatin  20 mg Oral QHS  . budesonide (PULMICORT) nebulizer solution  0.25 mg Nebulization BID  . chlorhexidine gluconate (MEDLINE KIT)  15 mL Mouth Rinse BID  . ezetimibe  10 mg Oral Daily  . ipratropium-albuterol  3 mL Nebulization TID  . mouth rinse  15 mL Mouth Rinse 10 times per day  . multivitamin  1 tablet Oral BID  . pantoprazole  40 mg Oral Daily   Continuous Infusions: . sodium chloride 75 mL/hr at 03/15/18 1200  . ceFEPime (MAXIPIME) IV    . clevidipine    .  fentaNYL infusion INTRAVENOUS Stopped (03/15/18 0837)  . norepinephrine (LEVOPHED) Adult infusion 6 mcg/min (03/15/18 1200)     LOS: 7 days    Time spent: 35 min    Andrea Colglazier, MD [facility] Pager on [place]  If 7PM-7AM, please contact night-coverage [email or web address] Password [facility] 03/15/2018, 2:41 PM

## 2018-03-15 NOTE — Progress Notes (Signed)
Spoke with Dr Estanislado Pandy regarding pt's SBP goal and being currently almost to the max range of Neo. Verbally stated SBP  100-110 goal.

## 2018-03-15 NOTE — Progress Notes (Signed)
PT Cancellation Note  Patient Details Name: Kelly Benjamin MRN: [id number] DOB: 03-Sep-1940   Cancelled Treatment:    Reason Eval/Treat Not Completed: Other (comment).  Pt is not sedated (per RN), but is still intubated.  She is technically also off of bedrest (as it is the sheath bedrest order).  RN advised checking back this PM as she may be extubated today.  PT will check back later.  Thanks,  Barbarann Ehlers. Rilyn Scroggs, PT, DPT  Acute Rehabilitation [phone] pager [phone]) [phone] office     Marisol B Sabin Gibeault 03/15/2018, 11:14 AM

## 2018-03-15 NOTE — Progress Notes (Signed)
Initial Nutrition Assessment  DOCUMENTATION CODES:   Not applicable  INTERVENTION:   If unable to extubate within the next 24 hours, recommend start enteral nutrition:   Vital AF 1.2 at 55 ml/h (1320 ml per day)  Provides 1584 kcal, 99 gm protein, 1071 ml free water daily  NUTRITION DIAGNOSIS:   Inadequate oral intake related to inability to eat as evidenced by NPO status.  GOAL:   Patient will meet greater than or equal to 90% of their needs  MONITOR:   Vent status, Labs, Skin, I & O's  REASON FOR ASSESSMENT:   Ventilator    ASSESSMENT:   78 yo female with PMH of COPD, HTN, breast cancer s/p XRT, CAD, HLD, ischemic cardiomyopathy who was admitted on 1/4 with symptomatic anemia, PNA, NSTEMI, and new onset A fib. She developed L sided weakness and gaze deviation; found to have acute R MCA infarct. S/P thrombectomy 1/10, then transferred to the ICU.  Patient did not tolerate vent weaning this morning. No plans to begin nutrition support at this time. Hopeful for extubation sometime tomorrow.  Prior to transfer to the ICU, patient was on a heart healthy CHO modified diet, consuming 0-50% of meals.   Patient is currently intubated on ventilator support MV: 11 L/min Temp (24hrs), Avg:97.7 F (36.5 C), Min:97 F (36.1 C), Max:99.1 F (37.3 C)   Labs reviewed. Sodium 133 (L) Medications reviewed and include prosight MVI, levophed. Cleviprex has been discontinued.     NUTRITION - FOCUSED PHYSICAL EXAM:    Most Recent Value  Orbital Region  No depletion  Upper Arm Region  No depletion  Thoracic and Lumbar Region  No depletion  Buccal Region  No depletion  Temple Region  Mild depletion  Clavicle Bone Region  Mild depletion  Clavicle and Acromion Bone Region  No depletion  Scapular Bone Region  Unable to assess  Dorsal Hand  No depletion  Patellar Region  No depletion  Anterior Thigh Region  No depletion  Posterior Calf Region  No depletion  Edema (RD  Assessment)  Mild  Hair  Reviewed  Eyes  Unable to assess  Mouth  Unable to assess  Skin  Reviewed  Nails  Reviewed       Diet Order:   Diet Order            Diet NPO time specified  Diet effective now              EDUCATION NEEDS:   No education needs have been identified at this time  Skin:  Skin Assessment: Reviewed RN Assessment  Last BM:  1/5 (type 4)  Height:   Ht Readings from Last 1 Encounters:  03/11/2018 5\' 10"  (1.778 m)    Weight:   Wt Readings from Last 1 Encounters:  03/15/18 69.3 kg    Ideal Body Weight:  68.2 kg  BMI:  Body mass index is 21.92 kg/m.  Estimated Nutritional Needs:   Kcal:  1575  Protein:  85-100 gm  Fluid:  1.6-1.8 L    Molli Barrows, RD, LDN, [place] Pager [phone] After Hours Pager [phone]

## 2018-03-15 NOTE — Progress Notes (Signed)
SLP Cancellation Note  Patient Details Name: RUTH KOVICH MRN: [id number] DOB: 1940-12-25   Cancelled treatment:       Reason Eval/Treat Not Completed: Medical issues which prohibited therapy  The patient is currently intubated.  ST will continue efforts to complete evaluation.  Shelly Flatten, MA, CCC-SLP Acute Rehab SLP [phone]  Lamar Sprinkles 03/15/2018, 7:14 AM

## 2018-03-15 NOTE — Progress Notes (Addendum)
Progress Note  Patient Name: Kelly Benjamin Date of Encounter: 03/15/2018  Primary Cardiologist: Quay Burow, MD   Subjective   Intubated, sedation being weaned, possible extubation today  Inpatient Medications    Scheduled Meds: .  stroke: mapping our early stages of recovery book   Does not apply Once  . arformoterol  15 mcg Nebulization BID  . aspirin EC  325 mg Oral Daily  . atorvastatin  20 mg Oral QHS  . budesonide (PULMICORT) nebulizer solution  0.25 mg Nebulization BID  . chlorhexidine gluconate (MEDLINE KIT)  15 mL Mouth Rinse BID  . ezetimibe  10 mg Oral Daily  . ipratropium-albuterol  3 mL Nebulization TID  . mouth rinse  15 mL Mouth Rinse 10 times per day  . multivitamin  1 tablet Oral BID  . pantoprazole  40 mg Oral Daily   Continuous Infusions: . sodium chloride 75 mL/hr at 03/15/18 0800  . ceFEPime (MAXIPIME) IV 2 g (03/13/18 2131)  . clevidipine    . fentaNYL infusion INTRAVENOUS 50 mcg/hr (03/15/18 0800)  . norepinephrine (LEVOPHED) Adult infusion 2 mcg/min (03/15/18 1048)   PRN Meds: acetaminophen **OR** acetaminophen (TYLENOL) oral liquid 160 mg/5 mL **OR** acetaminophen, bisacodyl, docusate, fentaNYL, ondansetron (ZOFRAN) IV   Vital Signs    Vitals:   03/15/18 1000 03/15/18 1015 03/15/18 1030 03/15/18 1045  BP: 122/74 106/64 122/64 124/85  Pulse: 86 96 80 82  Resp: (!) '21 16 17 19  '$ Temp:      TempSrc:      SpO2: 100% 100% 100% 100%  Weight:      Height:        Intake/Output Summary (Last 24 hours) at 03/15/2018 1101 Last data filed at 03/15/2018 0800 Gross per 24 hour  Intake 4370.43 ml  Output 2850 ml  Net 1520.43 ml   Filed Weights   03/09/18 0601 03/07/2018 0300 03/15/18 0500  Weight: 68 kg 69.3 kg 69.3 kg    Telemetry    Afib, rate generally controlled, occ PVCs - Personally Reviewed  Physical Exam   General: Well developed, well nourished, female on the vent Head: Eyes PERRLA, No xanthomas.   Normocephalic and  atraumatic Lungs: Clear anteriorly to auscultation, some rhonchi posteriorly. Heart: Irreg R&R S1 S2, without MRG.  Pulses are 2+ & equal upper extrem, decreased bilat LE. No JVD seen, difficult to assess 2nd body habitus. Abdomen: Bowel sounds are present, abdomen soft and non-tender without masses or  hernias noted. Msk: not able to assess due to pt condition Extremities: No clubbing, cyanosis, trace edema.    Skin:  No rashes or lesions noted. Neuro: still some sedation on board, no verbal response   Labs    Chemistry Recent Labs  Lab 03/28/2018 0400  03/12/18 0518 03/13/18 0243 03/17/2018 0354 03/15/18 0920  NA 135   < >  --  128* 127* 133*  K 3.9   < >  --  3.9 3.8 4.0  CL 105   < >  --  99 98 105  CO2 15*   < >  --  20* 21* 18*  GLUCOSE 140*   < >  --  122* 128* 101*  BUN 32*   < >  --  53* 48* 35*  CREATININE 1.59*   < >  --  1.57* 1.28* 0.88  CALCIUM 9.2   < >  --  8.3* 8.0* 7.9*  PROT 6.2*  --  5.3*  --   --   --  ALBUMIN 2.4*  --  2.0*  --   --   --   AST 214*  --  68*  --   --   --   ALT 172*  --  136*  --   --   --   ALKPHOS 79  --  435*  --   --   --   BILITOT 1.1  --  0.9  --   --   --   GFRNONAA 31*   < >  --  32* 41* >60  GFRAA 36*   < >  --  37* 47* >60  ANIONGAP 15   < >  --  '9 8 10   '$ < > = values in this interval not displayed.     Hematology Recent Labs  Lab 03/13/18 0243 03/09/2018 0354 03/15/18 0602  WBC 14.8* 15.4* 17.0*  RBC 3.11* 3.16* 3.81*  HGB 8.0* 7.9* 9.8*  HCT 25.8* 26.2* 32.0*  MCV 83.0 82.9 84.0  MCH 25.7* 25.0* 25.7*  MCHC 31.0 30.2 30.6  RDW 20.1* 19.9* 18.7*  PLT 277 249 301    Cardiac Enzymes Recent Labs  Lab 03/15/2018 1641 03/09/18 0341 03/09/18 0945 03/09/18 1557  TROPONINI 0.73* 1.08* 1.35* 1.30*     Radiology    Ct Angio Head W Or Wo Contrast  Result Date: 03/22/2018 CLINICAL DATA:  Acute onset of left-sided weakness 2 hours ago. EXAM: CT ANGIOGRAPHY HEAD AND NECK TECHNIQUE: Multidetector CT imaging of the head  and neck was performed using the standard protocol during bolus administration of intravenous contrast. Multiplanar CT image reconstructions and MIPs were obtained to evaluate the vascular anatomy. Carotid stenosis measurements (when applicable) are obtained utilizing NASCET criteria, using the distal internal carotid diameter as the denominator. CONTRAST:  142m ISOVUE-370 IOPAMIDOL (ISOVUE-370) INJECTION 76% COMPARISON:  CT head without contrast 04/04/2018 FINDINGS: CTA NECK FINDINGS Aortic arch: Atherosclerotic calcifications are present at the aortic arch. There is no aneurysm. No significant stenosis is present at the great vessel origins. Right carotid system: The right common carotid artery is tortuous with atherosclerotic changes throughout the course of the right common carotid artery. Calcifications extend through the bifurcation to the proximal right ICA without a significant stenosis relative to the more distal vessel. The cervical right ICA is within normal limits. Left carotid system: Minimal atherosclerotic calcifications are present within the left common carotid artery. Dense calcifications are present at the left carotid bifurcation. There is no significant stenosis relative to the more distal vessel. Atherosclerotic changes extend over 3 cm from the bifurcation. The cervical left ICA is otherwise normal. Vertebral arteries: The proximal left vertebral artery demonstrates a high-grade stenosis with extensive calcification. Opacification of the more distal left vertebral artery may reflect retrograde flow. No definite lumen is seen proximally. The non dominant right vertebral artery origin is within normal limits. There is no significant stenosis to the right vertebral artery in the neck. Atherosclerotic changes are present at C1-2 on the left without a significant stenosis. Skeleton: Multilevel degenerative changes are present in the cervical spine, particularly facet disease. This results in  slight anterolisthesis at C4-5. No focal lytic or blastic lesions are present. Other neck: The soft tissues the neck demonstrate no focal mucosal or submucosal lesions. Thyroid is within normal limits. No significant adenopathy is present. Salivary glands are normal. Upper chest: Increasing bilateral pleural effusions are present. Right upper lobe consolidated pneumonia is again seen. Mild edema is present. Review of the MIP images confirms the above findings  CTA HEAD FINDINGS Anterior circulation: Atherosclerotic changes are present throughout the cavernous right internal carotid artery without a significant stenosis through the ICA terminus. Calcifications are present in the left cavernous internal carotid artery without a significant stenosis. A proximal right M1 segment occlusion is present. Right A1 segment is patent. Left A1 and M1 segments are within normal limits. The left MCA bifurcation is intact. Left MCA branch vessels are within normal limits. There is mild distal small vessel disease of the ACA vessels. No significant right MCA collateralization is present. Posterior circulation: The left vertebral artery is the dominant vessel. The hypoplastic right vertebral artery essentially terminates at the PICA sending only very small branch to the vertebrobasilar junction. There is mild narrowing of the distal left V4 segment. Basilar artery is small. Both posterior cerebral arteries originate from the basilar tip. A small left posterior communicating artery is present. PCA branch vessels demonstrate mild irregularity without a significant proximal stenosis or occlusion. Venous sinuses: The dural sinuses are patent. Straight sinus and deep cerebral veins are intact. Cortical veins are unremarkable. Anatomic variants: None Review of the MIP images confirms the above findings IMPRESSION: 1. Emergent large vessel occlusion involving the proximal right M1 segment. 2. Very poor collateral flow to the right MCA  distribution. 3. 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 4. Opacification of the left vertebral artery above the V1 segment may be retrograde flow. 5. Mild diffuse small vessel disease within the ACA branch vessels, left MCA branch vessels, and posterior circulation. 6. Extensive atherosclerotic calcification and vessel wall irregularity at the carotid bifurcations bilaterally without significant stenosis. 7. Extensive atherosclerotic changes within the cavernous internal carotid arteries bilaterally without significant stenosis. 8. Atherosclerotic changes at the aortic arch and particularly the left subclavian artery without significant stenosis. The above was relayed via text pager to Dr. Roland Rack on 03/27/2018 at 20:55. Electronically Signed   By: San Morelle M.D.   On: 03/29/2018 21:17   Ct Angio Neck W Or Wo Contrast  Result Date: 03/17/2018 CLINICAL DATA:  Acute onset of left-sided weakness 2 hours ago. EXAM: CT ANGIOGRAPHY HEAD AND NECK TECHNIQUE: Multidetector CT imaging of the head and neck was performed using the standard protocol during bolus administration of intravenous contrast. Multiplanar CT image reconstructions and MIPs were obtained to evaluate the vascular anatomy. Carotid stenosis measurements (when applicable) are obtained utilizing NASCET criteria, using the distal internal carotid diameter as the denominator. CONTRAST:  175m ISOVUE-370 IOPAMIDOL (ISOVUE-370) INJECTION 76% COMPARISON:  CT head without contrast 03/21/2018 FINDINGS: CTA NECK FINDINGS Aortic arch: Atherosclerotic calcifications are present at the aortic arch. There is no aneurysm. No significant stenosis is present at the great vessel origins. Right carotid system: The right common carotid artery is tortuous with atherosclerotic changes throughout the course of the right common carotid artery. Calcifications extend through the bifurcation to the proximal right ICA  without a significant stenosis relative to the more distal vessel. The cervical right ICA is within normal limits. Left carotid system: Minimal atherosclerotic calcifications are present within the left common carotid artery. Dense calcifications are present at the left carotid bifurcation. There is no significant stenosis relative to the more distal vessel. Atherosclerotic changes extend over 3 cm from the bifurcation. The cervical left ICA is otherwise normal. Vertebral arteries: The proximal left vertebral artery demonstrates a high-grade stenosis with extensive calcification. Opacification of the more distal left vertebral artery may reflect retrograde flow. No definite lumen is seen proximally. The non dominant  right vertebral artery origin is within normal limits. There is no significant stenosis to the right vertebral artery in the neck. Atherosclerotic changes are present at C1-2 on the left without a significant stenosis. Skeleton: Multilevel degenerative changes are present in the cervical spine, particularly facet disease. This results in slight anterolisthesis at C4-5. No focal lytic or blastic lesions are present. Other neck: The soft tissues the neck demonstrate no focal mucosal or submucosal lesions. Thyroid is within normal limits. No significant adenopathy is present. Salivary glands are normal. Upper chest: Increasing bilateral pleural effusions are present. Right upper lobe consolidated pneumonia is again seen. Mild edema is present. Review of the MIP images confirms the above findings CTA HEAD FINDINGS Anterior circulation: Atherosclerotic changes are present throughout the cavernous right internal carotid artery without a significant stenosis through the ICA terminus. Calcifications are present in the left cavernous internal carotid artery without a significant stenosis. A proximal right M1 segment occlusion is present. Right A1 segment is patent. Left A1 and M1 segments are within normal  limits. The left MCA bifurcation is intact. Left MCA branch vessels are within normal limits. There is mild distal small vessel disease of the ACA vessels. No significant right MCA collateralization is present. Posterior circulation: The left vertebral artery is the dominant vessel. The hypoplastic right vertebral artery essentially terminates at the PICA sending only very small branch to the vertebrobasilar junction. There is mild narrowing of the distal left V4 segment. Basilar artery is small. Both posterior cerebral arteries originate from the basilar tip. A small left posterior communicating artery is present. PCA branch vessels demonstrate mild irregularity without a significant proximal stenosis or occlusion. Venous sinuses: The dural sinuses are patent. Straight sinus and deep cerebral veins are intact. Cortical veins are unremarkable. Anatomic variants: None Review of the MIP images confirms the above findings IMPRESSION: 1. Emergent large vessel occlusion involving the proximal right M1 segment. 2. Very poor collateral flow to the right MCA distribution. 3. 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 4. Opacification of the left vertebral artery above the V1 segment may be retrograde flow. 5. Mild diffuse small vessel disease within the ACA branch vessels, left MCA branch vessels, and posterior circulation. 6. Extensive atherosclerotic calcification and vessel wall irregularity at the carotid bifurcations bilaterally without significant stenosis. 7. Extensive atherosclerotic changes within the cavernous internal carotid arteries bilaterally without significant stenosis. 8. Atherosclerotic changes at the aortic arch and particularly the left subclavian artery without significant stenosis. The above was relayed via text pager to Dr. Roland Rack on 04/03/2018 at 20:55. Electronically Signed   By: San Morelle M.D.   On: 04/04/2018 21:17   Dg Chest Port 1  View  Result Date: 03/15/2018 CLINICAL DATA:  Intubation EXAM: PORTABLE CHEST 1 VIEW COMPARISON:  03/11/2018 FINDINGS: Endotracheal tube placed with tip measuring 3.3 cm above the carina. Enteric tube placed. Tip is off the field of view but below the left hemidiaphragm. Cardiac enlargement. No vascular congestion. Left perihilar mass or consolidation with consolidation or atelectasis in the left lower lung as well. Similar appearance to previous study, lying for differences in technique. No pneumothorax. Calcification of the aorta. IMPRESSION: Appliances appear in satisfactory location. Cardiac enlargement. Left perihilar mass or consolidation with consolidation or atelectasis in the left lower lung, unchanged. Electronically Signed   By: Lucienne Capers M.D.   On: 03/15/2018 00:16   Ct Head Code Stroke Wo Contrast  Result Date: 03/09/2018 CLINICAL DATA:  Code stroke. Left-sided  weakness and slurred speech. EXAM: CT HEAD WITHOUT CONTRAST TECHNIQUE: Contiguous axial images were obtained from the base of the skull through the vertex without intravenous contrast. COMPARISON:  None. FINDINGS: Brain: Mild atrophy and white matter changes are within normal limits for age. No acute hemorrhage or mass lesion is present. There is decreased density of the right lentiform nucleus and insular ribbon. M1 and M2 gray-white differentiation is decreased. Super ganglionic cortex is within normal limits. Ventricles are of normal size. No significant extraaxial fluid collection is present. Remote lacunar infarcts are present in the right cerebellum. Brainstem is within normal limits. Vascular: A hyperdense right MCA is present. Atherosclerotic calcifications are present bilaterally. Skull: Calvarium is intact. No focal lytic or blastic lesions are present. Sinuses/Orbits: The paranasal sinuses and mastoid air cells are clear. Bilateral lens replacements are noted. Globes and orbits are otherwise unremarkable. ASPECTS [facility]  Stroke Program Early CT Score) - Ganglionic level infarction (caudate, lentiform nuclei, internal capsule, insula, M1-M3 cortex): 3/7 - Supraganglionic infarction (M4-M6 cortex): 3/3 Total score (0-10 with 10 being normal): 6/10 IMPRESSION: 1. Acute right MCA territory infarct with decreased density in the right lentiform nucleus, right insular cortex, and right M1 and M2 cortex. 2. Hyperdense right MCA suggesting acute thrombus. 3. ASPECTS is 6/10 The above was relayed via text pager to Dr. Roland Rack on 03/12/2018 at 20:55 . Electronically Signed   By: San Morelle M.D.   On: 03/17/2018 20:57    Cardiac Studies   Echocardiogram 06/20/2016: Study Conclusions: - Left ventricle: The cavity size was normal. There was mild   concentric hypertrophy. Systolic function was mildly to   moderately reduced. The estimated ejection fraction was in the   range of 40% to 45%. Hypokinesis of the anterior and   anterolateral with akinesis of the inferolateral myocardium.   Doppler parameters are consistent with abnormal left ventricular   relaxation (grade 1 diastolic dysfunction). Doppler parameters   are consistent with indeterminate ventricular filling pressure. - Aortic valve: Transvalvular velocity was within the normal range.   There was no stenosis. There was no regurgitation. - Mitral valve: Transvalvular velocity was within the normal range.   There was no evidence for stenosis. There was mild regurgitation. - Right ventricle: Systolic function was normal. - Atrial septum: No defect or patent foramen ovale was identified   by color flow Doppler. - Tricuspid valve: There was trivial regurgitation. - Pulmonary arteries: Systolic pressure was within the normal   range. PA peak pressure: 21 mm Hg (S). _______________  Left Heart Catheterization 03/02/2016:  Prox RCA to Mid RCA lesion, 90 %stenosed.  Dist LAD lesion, 85 %stenosed.    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 The right coronary is a very large territory and also has left right collaterals from the left coronary system. The likely scenario is the patient's inferolateral infarction occurred due to thrombotic occlusion and she subsequently had on pain is recanalization.  Widely patent circumflex.  Diffuse 85-90% apical LAD disease.  Elevated left ventricular end-diastolic pressure. Previously documented LVEF in the 30-35% range by noninvasive imaging.  Recommendations:  In absence of angina or ischemia on nuclear scintigraphy, medical therapy would appear to be the treatment of choice.  LV dysfunction is likely secondary to chronic remodeling following inferolateral infarction which occurred remotely.  Guideline mandated heart failure therapy. Discontinuation amlodipine will allow further up titration of heart failure agents.  Patient Profile   Kelly Benjamin is a  78 y.o. female with a history of of CAD, ischemic cardiomyopathy with EF of 40-45% on Echo in 06/2016, hyperlipidemia, COPD, and left breast cancer s/p radiation and lumpectomy who presented to the [facility] ED on 03/08/2017 for evaluation of worsening shortness of breath and edema. On arrival, she was found to have profound anemia with a hemoglobin of 5.6. Chest x-ray consistent with left upper lobe pneumonia. Troponin also elevated. Cardiology was consulted for acute on chronic systolic CHF and NSTEMI.  Assessment & Plan    Acute Hypoxemic Respiratory Failure  - per primary MDs  Acute Combined Systolic and Diastolic CHF  - EF newly 29-79%  (40-45%) - likely ischemic, no eval at this time due to other med issues - I/O +4.8 L since admit, no Lasix right now due to hypotension - no ACE/ARB or BB due to low BP  New Onset Atrial Fibrillation  - new dx - HR is generally controlled, some low rates but not sustained - no rate-lowering rx as she is on pressors - no anticoag w/  anemia/bleeding issues   Elevated Troponin with Known CAD - peak Trop 1.35 - was on ASA (d/c'd), on statin, BB ordered but never given due to low BP/HR - f/u w/ Dr Gwenlyn Found as oupt and he will decide further eval  Acute Anemia of Unknown Etiology - Hgb (prev nl) 5.6 on admit - s/p 2 U PRBCs initially, additional 1 U PRBCs last 24 hr - GI following, EGD when O2 needs are lower  Community Acquired Pneumonia - ABX per primary MDs   For questions or updates, please contact [facility] Please consult [email or web address] for contact info under        Signed, Rosaria Ferries, PA-C  03/15/2018, 11:01 AM    ---------------------------------------------------------------------------------------------   History and all data above reviewed.  Patient examined.  I agree with the findings as above.  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   Constitutional: On mechanical ventilator Cardiovascular: irregular rhythm, normal rate, no murmurs. S1 and S2 normal. Radial pulses normal bilaterally. No jugular venous distention.  Respiratory: intubated  GI : normal bowel sounds, soft and nontender. No distention.   MSK: extremities warm, well perfused. No edema.   All available labs, radiology testing, previous records reviewed. Agree with documented assessment and plan of my colleague as stated above with the following additions or changes:  Principal Problem:   PNA (pneumonia) Active Problems:   COPD (chronic obstructive pulmonary disease) (HCC)   SOB (shortness of breath)   Ischemic cardiomyopathy   Normocytic anemia   NSTEMI (non-ST elevated myocardial infarction) (HCC)   Abnormal CXR   Atrial fibrillation (HCC)   Acute combined systolic and diastolic heart failure (HCC)   Acute respiratory distress   Middle cerebral artery embolism, right    Plan: Challenging situation is now complicated by new CVA felt to be embolic, with  thrombectomy.   In atrial fibrillation but unable to anticoagulate due to incomplete bleeding workup due to respiratory status.   Likely ischemic cardiomyopathy, again unable to administer antiplatelets and heparin due to concern for acute anemia.   Cardiology will continue to follow for support, no interventions able to be performed at the moment due to critical illness. Could consider TEE if still intubated on Monday.   I spoke to the patient's friends [name] and Jeannene Patella today Jeannene Patella is a PA in oncology) who were present in the room, and discussed next steps  and current CV plan. I answered their questions to the best of my ability.  Time Spent Directly with Patient:  I have spent a total of 30 minutes with the patient reviewing hospital notes, telemetry, EKGs, labs and examining the patient as well as establishing an assessment and plan that was discussed personally with the patient.  > 50% of time was spent in direct patient care.  Length of Stay:  LOS: 7 days   Elouise Munroe, MD [facility] 12:19 PM  03/15/2018

## 2018-03-15 NOTE — Evaluation (Signed)
Physical Therapy Evaluation Patient Details Name: Kelly Benjamin MRN: [id number] DOB: 12/19/1940 Today's Date: 03/15/2018   History of Present Illness  78 y.o. female admitted on 04/03/2018 for SOB.  Pt initally dx with PNA, NSTEMI vs demand ischemia vs ACS.  Pt with symptomatic anemia (GI consulted ro r/o bleed).  Pt converted to A-fib with RVR on and off of HFNC and BiPAP.  Pt's course complicated when code stroke was called on 03/13/2018 due to sudden onset L sided weakness.  Pt underwent revascularization of R ICA. tPA was not given due to suspected GIB.  Pt intubated for procedure and not yet intubated (as of 03/15/18).  Pt with other significant PMH of HTN, COPD, CAD, L Breast CA, knee surgery.    Clinical Impression  Pt was able to sit EOB for a few minutes with me and followed most basic commands to move the right side of her body.  She has R gaze and very little movement of her left side leg or arm.  She tolerated EOB well (VSS on PRVC vent 30% PEEP 5), however, she was pushing so hard to the left and due to all of the lines I could not get next to her well with only one person assist, so we returned to supine after about 3 mins.  She would be an excellent post acute rehab candidate, however, she is unable to currently tell me her home situation or anything about her family support.   PT to follow acutely for deficits listed below.      Follow Up Recommendations CIR    Equipment Recommendations  Wheelchair (measurements PT);Wheelchair cushion (measurements PT);3in1 (PT);Hospital bed    Recommendations for Other Services Rehab consult     Precautions / Restrictions Precautions Precautions: Fall Precaution Comments: left sided weakness, R gaze      Mobility  Bed Mobility Overal bed mobility: Needs Assistance Bed Mobility: Supine to Sit;Sit to Supine     Supine to sit: Max assist;HOB elevated Sit to supine: Total assist;HOB elevated   General bed mobility comments: Max assist to  come to sitting EOB, pt attempting to help weakly.  Total assist to return to supine and scoot back to [facility].    Transfers                 General transfer comment: NT as pt was pushing hard to the left in sitting.       Modified Rankin (Stroke Patients Only) Modified Rankin (Stroke Patients Only) Pre-Morbid Rankin Score: No symptoms Modified Rankin: Severe disability     Balance Overall balance assessment: Needs assistance Sitting-balance support: Feet supported;Single extremity supported Sitting balance-Leahy Scale: Zero Sitting balance - Comments: Max assist EOB due to pushing with R UE to the left.  PT attempted to remove her right hand from surface and put it in her lap, but she just puts it back.  Total support needed at trunk for balance.  VSS, I just could no sustain sitting EOB long with just one person assist due to persistent pushing.   Postural control: Left lateral lean                                   Pertinent Vitals/Pain Pain Assessment: No/denies pain    Home Living Family/patient expects to be discharged to:: Private residence                 Additional Comments:  Pt unable to report due to ETT    Prior Function Level of Independence: Independent         Comments: per chart     Hand Dominance   Dominant Hand: Right    Extremity/Trunk Assessment   Upper Extremity Assessment Upper Extremity Assessment: Defer to OT evaluation    Lower Extremity Assessment Lower Extremity Assessment: LLE deficits/detail LLE Deficits / Details: left leg with 0/5 ankle, trace knee and hip    Cervical / Trunk Assessment Cervical / Trunk Assessment: Normal  Communication   Communication: Other (comment)(ETT)  Cognition Arousal/Alertness: Awake/alert Behavior During Therapy: WFL for tasks assessed/performed Overall Cognitive Status: Impaired/Different from baseline Area of Impairment: Following commands;Awareness                        Following Commands: Follows one step commands consistently(on her right side)   Awareness: Intellectual   General Comments: Was not able to nod and indicate that her left leg was weak.  Able to move right arm and leg briskly to command.              Assessment/Plan    PT Assessment Patient needs continued PT services  PT Problem List Decreased strength;Decreased activity tolerance;Decreased balance;Decreased mobility;Decreased cognition;Decreased knowledge of use of DME;Decreased safety awareness;Decreased knowledge of precautions;Cardiopulmonary status limiting activity;Impaired sensation       PT Treatment Interventions DME instruction;Gait training;Stair training;Therapeutic activities;Functional mobility training;Therapeutic exercise;Balance training;Neuromuscular re-education;Cognitive remediation;Patient/family education;Wheelchair mobility training    PT Goals (Current goals can be found in the Care Plan section)  Acute Rehab PT Goals Patient Stated Goal: unable to state, intubated PT Goal Formulation: Patient unable to participate in goal setting Time For Goal Achievement: 03/29/18 Potential to Achieve Goals: Good    Frequency Min 4X/week           AM-PAC PT "6 Clicks" Mobility  Outcome Measure Help needed turning from your back to your side while in a flat bed without using bedrails?: Total Help needed moving from lying on your back to sitting on the side of a flat bed without using bedrails?: Total Help needed moving to and from a bed to a chair (including a wheelchair)?: Total Help needed standing up from a chair using your arms (e.g., wheelchair or bedside chair)?: Total Help needed to walk in hospital room?: Total Help needed climbing 3-5 steps with a railing? : Total 6 Click Score: 6    End of Session Equipment Utilized During Treatment: Oxygen;Other (comment)(vent on PRVC 30% PEEP 5) Activity Tolerance: Other (comment)(limited by pushing EOB.  ) Patient left: in bed;with call bell/phone within reach;with bed alarm set Nurse Communication: Mobility status PT Visit Diagnosis: Muscle weakness (generalized) (M62.81);Difficulty in walking, not elsewhere classified (R26.2);Other symptoms and signs involving the nervous system (R29.898);Hemiplegia and hemiparesis Hemiplegia - Right/Left: Left Hemiplegia - dominant/non-dominant: Non-dominant Hemiplegia - caused by: Cerebral infarction    Time: 3500-9381 PT Time Calculation (min) (ACUTE ONLY): 17 min   Charges:       Wells Guiles B. Ople Girgis, PT, DPT  Acute Rehabilitation #[phone] pager #[phone] office   PT Evaluation $PT Eval Moderate Complexity: 1 Mod         03/15/2018, 5:07 PM

## 2018-03-15 NOTE — Progress Notes (Addendum)
NAME:  Kelly Benjamin, MRN:  [id number], DOB:  07-31-1940, LOS: 37 ADMISSION DATE:  03/10/2018, CONSULTATION DATE:  03/25/2018 REFERRING MD:  [facility], CHIEF COMPLAINT:  Vent management  History of present illness   78 year old woman with history of CAD, ischemic cardiomyopathy, HFrEF (EF 40-45% in 2018 and now 15-20% in 2020), COPD, HTN admitted on 03/07/2018 with dyspnea on exertion and BLE edema found to have left upper lobe pneumonia, acute symptomatic anemia (hgb 5.6), NSTEMI, and new onset atrial fibrillation. Initially required BiPAP. She was given Rocephin and azithromycin but this was transitioned to vanc/cefepime. She was diuresed during the hospital course. Tonight, she developed new onset left sided weakness and gaze deviation. CTA noted total occlusion of right proximal M1 segment without collaterals. Underwent right common carotid arteriogram and found to have occluded right ICA terminus, right MCA and right ACA. She underwent thrombectomy with IR 03/26/2018.   Past Medical History   CAD, ischemic cardiomyopathy, HFrEF (EF 40-45% in 2018), COPD, HTN, breast cancer, HLD  Significant Hospital Events   1/4> admit, working diagnosis of CAP, complicated by new onset atrial fibrillation, non-ST elevation MI and new heart failure 1/10> thrombectomy, transfer to ICU 1/11: Remains hemi-paretic on the left side, weaning.  MRI ordered and pending Consults:  IR 1/10 Neuro 1/10 GI 1/5 Cards 1/4  Procedures:  1/10> IR thrombectomy  Significant Diagnostic Tests:  CTA head/neck w/ w/o 1/10> 1. Emergent large vessel occlusion involving the proximal right M1 segment. 2. Very poor collateral flow to the right MCA distribution. 3. 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 4. Opacification of the left vertebral artery above the V1 segment may be retrograde flow. 5. Mild diffuse small vessel disease within the ACA branch vessels, left MCA branch  vessels, and posterior circulation.  CT head w/o contrast 1/10> Acute right MCA territory infarct with decreased density in the right lentiform nucleus, right insular cortex, and right M1 and M2 cortex. Hyperdense right MCA suggesting acute thrombus.  CXR 1/7> Rounded opacity is again noted in the left mid lung   CT chest w/o contrast 1/4> Dense left upper lobe airspace disease. Small left pleural effusion noted. Mild interlobular septal thickening. Calcific coronary artery disease. Aortic atherosclerosis and emphysema  TTE 1/6>  - Left ventricle: The cavity size was normal. Wall thickness was increased in a pattern of mild LVH. Systolic function was severely reduced. The estimated ejection fraction was in the range of 15% to 20%. Diffuse hypokinesis. Features are consistent with a pseudonormal left ventricular filling pattern, with concomitant abnormal relaxation and increased filling pressure (grade 2 diastolic dysfunction). Doppler parameters are consistent with high ventricular filling pressure. - Aortic valve: Trileaflet; mildly thickened, mildly calcified   leaflets. - Mitral valve: There was moderate regurgitation. - Left atrium: The atrium was severely dilated. - Right ventricle: Systolic function was moderately reduced. - Right atrium: The atrium was severely dilated. - Tricuspid valve: There was moderate regurgitation. - Pulmonary arteries: Systolic pressure was mildly increased. PA   peak pressure: 41 mm Hg (S).  Micro Data:  Blood Cx x 2 1/4 > NGTD  Antimicrobials:  Azithromycin 500 mg IV 1/4 > 1/8 Cefepime 1/7 > 1/12 Ceftriaxone 1/4 > 1/6 Vancomycin 1/6 > 1/9  Interim history/subjective:  Weaning now.  Objective   Blood pressure (Abnormal) 125/52, pulse 83, temperature 98.1 F (36.7 C), temperature source Axillary, resp. rate 14, height 5\' 10"  (1.778 m), weight 69.3 kg, SpO2 100 %.    Vent  Mode: PRVC FiO2 (%):  [40 %] 40 % Set Rate:  [14 bmp] 14 bmp Vt Set:  [550  mL] 550 mL PEEP:  [5 cmH20] 5 cmH20 Plateau Pressure:  [18 cmH20-22 cmH20] 20 cmH20   Intake/Output Summary (Last 24 hours) at 03/15/2018 1003 Last data filed at 03/15/2018 0800 Gross per 24 hour  Intake 4373.43 ml  Output 2850 ml  Net 1523.43 ml   Filed Weights   03/09/18 0601 03/10/2018 0300 03/15/18 0500  Weight: 68 kg 69.3 kg 69.3 kg    Examination: General: This is a 78 year old white female currently remains on mechanical ventilation but is in no acute distress HEENT normocephalic atraumatic no jugular venous distention orally intubated Pulmonary: Scattered rhonchi equal chest rise bilaterally.  Tidal volume in the 600 range on pressure support 5 cmH2O no accessory use with this Cardiac: Regular irregular with atrial fibrillation on telemetry Abdomen: Soft nontender no organomegaly Extremities: Warm, dry, does have trace lower extremity edema.  Positive pulses.  Right groin dressing is intact Neuro: Awakens with verbal request.  Right-side will follow commands.  She is hemiparetic on the left. Assessment & Plan:  78 year old woman with history of CAD, ischemic cardiomyopathy, HFrEF (EF 40-45% in 2018 and now 15-20% in 2020), COPD, HTN admitted on 03/05/2018 with left upper lobe pneumonia, acute symptomatic anemia (hgb 5.6), NSTEMI, and new onset atrial fibrillation. Hospital course complicated by development of new onset left sided weakness and gaze deviation. Underwent right common carotid arteriogram and found to have occluded right ICA terminus, right MCA and right ACA. She underwent thrombectomy with IR 03/14/2018.   Acute Right MCA CVA, Acute Right ICA Occlusion s/p IR Embolectomy 1/10:  Plan Blood pressure goal 1 20-1 40 Aspirin statin Zetia per stroke team Follow-up MRI Serial neuro checks Rehab focus  Acute hypoxic respiratory failure, CAP with risk factors for pseudomonas: Portable chest x-ray personally reviewed: Endotracheal tube in satisfactory position, bibasilar  atelectasis, slight improvement in left upper lobe consolidation. Excellent mechanics on pressure support ventilation Plan Continue spontaneous breathing trial Scheduled bronchodilators She is to complete her last day of cefepime today PAD protocol rascal 0 VAP bundle Discontinue vancomycin Sputum culture prior to extubation Pulmonary hygiene N.p.o. post extubation if extubated successfully later today  Acute kidney injury with progressive hyponatremia, this actually improved with IV hydration Plan Hold diuresis Continue current saline infusion rate  Non-anion gap metabolic acidosis Plan Trend chemistries, at risk for hyperchloremic acidosis  COPD: Plan Continue Brovana and Pulmicort in lieu of Breo DuoNeb scheduled  Hypotension, suspect this is drug-induced from sedation Plan Wean Neo-Synephrine Holding ACE inhibitor and ARB  NSTEMI: Patient had troponin peaked to 1.35 on 1/5. Plan Continuing aspirin statin and Zetia Eventually considering cardiac catheterization, this may need to be reconsidered given current events Further recommendations per cardiology  Acute combined systolic and diastolic CHF:  Plan Holding diuresis and Lopressor at this point given hypotension  New onset atrial fibrillation Plan Continue telemetry monitoring Rate control as indicated Holding anticoagulation this time did have symptomatic anemia  Acute symptomatic anemia: Patient's baseline hemoglobin is around 11. Plan Seen by GI medicine last on the seventh. Considering EGD in the future Continuing pantoprazole Transfuse for hemoglobin less than 7  Transaminitis, this is improving. Plan Trend Best practice:  Diet: NPO Pain/Anxiety/Delirium protocol (if indicated): fent gtt and prn for goal RASS 0 VAP protocol (if indicated): Ordered DVT prophylaxis: SCDs GI prophylaxis: PPI Glucose control: Monitor Mobility: PT/OT Code Status: Full Family Communication: None at  bedside Disposition: ICU remains critically ill.  Requires intensive care therapy for weaning of mechanical ventilation, titration of vasoactive drips, and close  observation, interpretation and management of metabolic derangements   Critical care time: 35 minutes   Erick Colace ACNP-BC [place] Pager # [phone] OR # [phone] if no answer

## 2018-03-15 NOTE — Progress Notes (Signed)
STROKE TEAM PROGRESS NOTE   SUBJECTIVE (INTERVAL HISTORY) Her RN is at the bedside.  Pt still intubated and drowsy on fentanyl, following simple commands on the right. Still has left hemiplegia. BP low still on neo, will switch to levophed. HR at 90s, in afib. MRI pending   OBJECTIVE Vitals:   03/15/18 0700 03/15/18 0800 03/15/18 0815 03/15/18 0830  BP: 118/72 118/75 106/73 109/64  Pulse: (!) 46 85 (!) 47 (!) 41  Resp: 14 15 15 15   Temp:  98.1 F (36.7 C)    TempSrc:  Axillary    SpO2: 100% 100% 100% 100%  Weight:      Height:        CBC:  Recent Labs  Lab 03/09/18 0341  03/12/2018 0354 03/15/18 0602  WBC 14.4*   < > 15.4* 17.0*  NEUTROABS 12.9*  --   --  14.2*  HGB 8.3*   < > 7.9* 9.8*  HCT 27.2*   < > 26.2* 32.0*  MCV 82.7   < > 82.9 84.0  PLT 359   < > 249 301   < > = values in this interval not displayed.    Basic Metabolic Panel:  Recent Labs  Lab 03/13/18 0243 03/07/2018 0354 03/15/18 0602  NA 128* 127*  --   K 3.9 3.8  --   CL 99 98  --   CO2 20* 21*  --   GLUCOSE 122* 128*  --   BUN 53* 48*  --   CREATININE 1.57* 1.28*  --   CALCIUM 8.3* 8.0*  --   MG 2.2 2.3  --   PHOS  --   --  3.1    Lipid Panel:     Component Value Date/Time   CHOL 71 03/15/2018 0602   CHOL 112 12/07/2016 0812   TRIG 64 03/15/2018 0602   HDL 26 (L) 03/15/2018 0602   HDL 32 (L) 12/07/2016 0812   CHOLHDL 2.7 03/15/2018 0602   VLDL 13 03/15/2018 0602   LDLCALC 32 03/15/2018 0602   LDLCALC 59 12/07/2016 0812   HgbA1c:  Lab Results  Component Value Date   HGBA1C 6.1 (H) 03/15/2018   Urine Drug Screen: No results found for: LABOPIA, COCAINSCRNUR, LABBENZ, AMPHETMU, THCU, LABBARB  Alcohol Level No results found for: ETH  IMAGING   Ct Angio Head W Or Wo Contrast Ct Angio Neck W Or Wo Contrast 03/17/2018 IMPRESSION:  1. Emergent large vessel occlusion involving the proximal right M1 segment.  2. Very poor collateral flow to the right MCA distribution.  3. High-grade  stenosis, possibly occlusion, of the dominant left vertebral artery.  4. Opacification of the left vertebral artery above the V1 segment may be retrograde flow.  5. Mild diffuse small vessel disease within the ACA branch vessels, left MCA branch vessels, and posterior circulation.  6. Extensive atherosclerotic calcification and vessel wall irregularity at the carotid bifurcations bilaterally without significant stenosis.  7. Extensive atherosclerotic changes within the cavernous internal carotid arteries bilaterally without significant stenosis.  8. Atherosclerotic changes at the aortic arch and particularly the left subclavian artery without significant stenosis.     Dg Chest Port 1 View 03/15/2018 IMPRESSION:  Appliances appear in satisfactory location. Cardiac enlargement. Left perihilar mass or consolidation with consolidation or atelectasis in the left lower lung, unchanged.     Ct Head Code Stroke Wo Contrast 04/01/2018 IMPRESSION:  1. Acute right MCA territory infarct with decreased density in the right lentiform nucleus, right insular cortex, and  right M1 and M2 cortex.  2. Hyperdense right MCA suggesting acute thrombus.  3. ASPECTS is 6/10      Transthoracic Echocardiogram  03/10/2017 Study Conclusions - Left ventricle: The cavity size was normal. Wall thickness was   increased in a pattern of mild LVH. Systolic function was   severely reduced. The estimated ejection fraction was in the   range of 15% to 20%. Diffuse hypokinesis. Features are consistent   with a pseudonormal left ventricular filling pattern, with   concomitant abnormal relaxation and increased filling pressure   (grade 2 diastolic dysfunction). Doppler parameters are   consistent with high ventricular filling pressure. - Aortic valve: Trileaflet; mildly thickened, mildly calcified   leaflets. - Mitral valve: There was moderate regurgitation. - Left atrium: The atrium was severely dilated. - Right  ventricle: Systolic function was moderately reduced. - Right atrium: The atrium was severely dilated. - Tricuspid valve: There was moderate regurgitation. - Pulmonary arteries: Systolic pressure was mildly increased. PA   peak pressure: 41 mm Hg (S). Impressions: - EF has reduced from prior study (40-45%)   Interventional Radiology - mechanical thrombectomy - Report Pending    PHYSICAL EXAM  Temp:  [97 F (36.1 C)-98.1 F (36.7 C)] 98.1 F (36.7 C) (01/11 0800) Pulse Rate:  [36-120] 83 (01/11 0834) Resp:  [14-35] 14 (01/11 0834) BP: (96-125)/(52-87) 125/52 (01/11 0834) SpO2:  [94 %-100 %] 100 % (01/11 0834) Arterial Line BP: (111-131)/(47-60) 121/50 (01/11 0830) FiO2 (%):  [40 %] 40 % (01/11 0834) Weight:  [69.3 kg] 69.3 kg (01/11 0500)  General - Well nourished, well developed, intubated on sedation.  Ophthalmologic - fundi not visualized due to noncooperation.  Cardiovascular - irregularly irregular heart rate and rhythm.  Neuro - intubated on sedation, able to briefly open eyes on voice, able to following all simple commands on the right. Eyes attending to both sides, PERRL, spontaneously blinking but not blinking to visual threat consistently. Facial symmetry not able to test due to ET tube. Tongue midline in mouth. On pain stimulation, RUE and RLE 3/5, but hemiplegia on the left. DTR 1+ and left babinski positive. Sensation, coordination and gait not tested.   ASSESSMENT/PLAN Ms. MCKAYLA MULCAHEY is a 78 y.o. female with history of coronary artery disease, ischemic cardiomyopathy with EF in the past to 40%, iron deficiency anemia as a child  presenting with CHF, elevated troponin, dysarthria and Lt sided weakness.  She did not receive IV t-PA due to recent GI bleeding and severe anemia and late presentation.  Stroke:  R MCA infarct due to right M1 occlusion s/p IR with TICI3 reperfusion - embolic, due to new onset A. fib and cardiomyopathy with low EF  CT head - Acute  right MCA territory infarct with decreased density in the right lentiform nucleus, right insular cortex, and right M1 and M2 cortex. Hyperdense right MCA suggesting acute thrombus.   MRI head pending  MRA head pending  CTA H&N - proximal right M1 LVO. High-grade stenosis, possibly occlusion of the dominant left vertebral artery.   2D Echo  - EF 15-20% decrease from prior 40 to 45%. No cardiac source of emboli identified.   LDL - 32  HgbA1c - 6.1  VTE prophylaxis - SCDs  Diet - NPO  aspirin 81 mg daily and clopidogrel 75 mg daily prior to admission, now on aspirin 81 mg daily.  Will consider DOAC if no large infarct on MRI  Patient counseled to be compliant with her antithrombotic medications  Ongoing aggressive stroke risk factor management  Therapy recommendations:  pending  Disposition:  Pending  New onset Afib  In the setting of pneumonia, anemia and non-STEMI  Currently NSR  Rate controlled  We will consider DOAC if no large infarct on MRI  Cardiomyopathy with low EF  2D echo showed EF 15 to 20% down from prior 40 to 45%  CCM on board  Avoid fluid overload  CXR in a.m.  We will consider DOAC if no large infarct on MRI  Non-STEMI  Cardiology on board  On aspirin  We will consider anticoagulation if no large infarct on MRI  Respiratory failure  Intubated for procedure  CCM on board  Extubate as able  Hypotension History of hypertension  BP runs low  On Levophed . BP goal 110-140 post procedure . On IV fluid  Hyperlipidemia  Lipid lowering medication PTA:  Lipitor 40 mg daily and Zetia 10 mg daily  LDL 32, goal < 70  Current lipid lowering medication: Lipitor 20 mg daily and Zetia 10 mg daily  Continue statin at discharge  Recent pneumonia  CXR showed left perihilar mass or consolidation or atelectasis in the left lower lung  Leukocytosis 17.0  On cefepime  CCM on board  Hyponatremia  Sodium 127-133  Continue  monitoring  Continue IV fluid with normal saline  Severe anemia  Hemoglobin 5.6-PRBC-9.8  GI on board plan for endoscopy once patient stable  CBC monitoring  Other Stroke Risk Factors  Advanced age  Former cigarette smoker - quit  ETOH use, advised to drink no more than 1 alcoholic beverage per day.  Coronary artery disease  Other Active Problems  Elevated creatinine 1.57-1.28-0.88   Hospital day # 7  This patient is critically ill due to right MCA stroke, A. fib RVR, cardiomyopathy with low EF, pneumonia, non-STEMI, anemia need blood transfusion, new onset A. fib and at significant risk of neurological worsening, death form recurrent stroke, hemorrhagic conversion, seizure, heart failure, cardiogenic shock. This patient's care requires constant monitoring of vital signs, hemodynamics, respiratory and cardiac monitoring, review of multiple databases, neurological assessment, discussion with family, other specialists and medical decision making of high complexity. I spent 45 minutes of neurocritical care time in the care of this patient.  I discussed with CCM PA.  Rosalin Hawking, MD PhD Stroke Neurology 03/15/2018 2:47 PM     To contact Stroke Continuity provider, please refer to [email or web address] After hours, contact General Neurology

## 2018-03-15 NOTE — Anesthesia Postprocedure Evaluation (Signed)
Anesthesia Post Note  Patient: Kelly Benjamin  Procedure(s) Performed: RADIOLOGY WITH ANESTHESIA (N/A )     Patient location during evaluation: SICU Anesthesia Type: General Level of consciousness: sedated Pain management: pain level controlled Vital Signs Assessment: post-procedure vital signs reviewed and stable Respiratory status: patient remains intubated per anesthesia plan Cardiovascular status: stable Postop Assessment: no apparent nausea or vomiting Anesthetic complications: no    Last Vitals:  Vitals:   03/15/18 0100 03/15/18 0130  BP: 104/62 111/66  Pulse: 74 68  Resp: 14 14  Temp:    SpO2: 100% 100%    Last Pain:  Vitals:   03/15/18 0015  TempSrc: Axillary  PainSc:                  Kelly Benjamin

## 2018-03-16 DIAGNOSIS — G936 Cerebral edema: Secondary | ICD-10-CM

## 2018-03-16 LAB — TYPE AND SCREEN
ABO/RH(D): A POS
Antibody Screen: NEGATIVE
Unit division: 0
Unit division: 0

## 2018-03-16 LAB — BPAM RBC
BLOOD PRODUCT EXPIRATION DATE: 202001172359
Blood Product Expiration Date: 202001172359
ISSUE DATE / TIME: 202001102230
ISSUE DATE / TIME: 202001110004
Unit Type and Rh: 6200
Unit Type and Rh: 6200

## 2018-03-16 LAB — CBC
HEMATOCRIT: 30.3 % — AB (ref 36.0–46.0)
Hemoglobin: 9.2 g/dL — ABNORMAL LOW (ref 12.0–15.0)
MCH: 25.6 pg — ABNORMAL LOW (ref 26.0–34.0)
MCHC: 30.4 g/dL (ref 30.0–36.0)
MCV: 84.4 fL (ref 80.0–100.0)
Platelets: 266 10*3/uL (ref 150–400)
RBC: 3.59 MIL/uL — ABNORMAL LOW (ref 3.87–5.11)
RDW: 19 % — ABNORMAL HIGH (ref 11.5–15.5)
WBC: 17.7 10*3/uL — ABNORMAL HIGH (ref 4.0–10.5)
nRBC: 0 % (ref 0.0–0.2)

## 2018-03-16 LAB — BASIC METABOLIC PANEL
ANION GAP: 10 (ref 5–15)
BUN: 30 mg/dL — ABNORMAL HIGH (ref 8–23)
CO2: 15 mmol/L — ABNORMAL LOW (ref 22–32)
Calcium: 8.3 mg/dL — ABNORMAL LOW (ref 8.9–10.3)
Chloride: 110 mmol/L (ref 98–111)
Creatinine, Ser: 0.97 mg/dL (ref 0.44–1.00)
GFR calc Af Amer: >60 mL/min (ref >60)
GFR calc non Af Amer: 57 mL/min — ABNORMAL LOW (ref >60)
GLUCOSE: 124 mg/dL — AB (ref 70–99)
Potassium: 4 mmol/L (ref 3.5–5.1)
Sodium: 135 mmol/L (ref 135–145)

## 2018-03-16 LAB — MAGNESIUM: Magnesium: 2.2 mg/dL (ref 1.7–2.4)

## 2018-03-16 MED ORDER — PANTOPRAZOLE SODIUM 40 MG IV SOLR
40.0000 mg | INTRAVENOUS | Status: DC
  Administered 2018-03-16: 40 mg via INTRAVENOUS
  Filled 2018-03-16 (×2): qty 40

## 2018-03-16 MED ORDER — ASPIRIN 325 MG PO TABS
325.0000 mg | ORAL_TABLET | Freq: Every day | ORAL | Status: DC
  Administered 2018-03-16 – 2018-03-18 (×2): 325 mg via ORAL
  Filled 2018-03-16 (×2): qty 1

## 2018-03-16 MED ORDER — ORAL CARE MOUTH RINSE
15.0000 mL | OROMUCOSAL | Status: DC
  Administered 2018-03-17 – 2018-03-22 (×23): 15 mL via OROMUCOSAL

## 2018-03-16 NOTE — Progress Notes (Signed)
Rehab Admissions Coordinator Note:  Patient was screened by Cleatrice Burke for appropriateness for an Inpatient Acute Rehab Consult per PT recs.   At this time, we are recommending Inpatient Rehab consult.  Cleatrice Burke RN MSN 03/16/2018, 11:27 AM  I can be reached at [phone].

## 2018-03-16 NOTE — Procedures (Signed)
Extubation Procedure Note  Patient Details:   Name: Kelly Benjamin DOB: 1940/05/26 MRN: [id number]   Airway Documentation:    Vent end date: 03/16/18 Vent end time: 0940   Evaluation  O2 sats: stable throughout Complications: No apparent complications Patient did tolerate procedure well. Bilateral Breath Sounds: Diminished   Yes. Extubated per Dr's orders.  Placed on 3L [place]  [name] V 03/16/2018, 9:44 AM

## 2018-03-16 NOTE — Progress Notes (Signed)
STROKE TEAM PROGRESS NOTE   SUBJECTIVE (INTERVAL HISTORY) [name] are at the bedside.  Pt is just extubated and tolerating so far. Still has right gaze preference and left hemiparesis. But awake alert and intact naming and repetition. MRI showed right large MCA infarct but right MCA is patent with luxury perfusion.   OBJECTIVE Vitals:   03/16/18 0700 03/16/18 0800 03/16/18 0804 03/16/18 0900  BP: (!) 114/55 106/63  (!) 105/58  Pulse: 90 92  (!) 120  Resp:      Temp:  98.1 F (36.7 C)    TempSrc:  Axillary    SpO2: 100% 100% 100% 100%  Weight:      Height:        CBC:  Recent Labs  Lab 03/15/18 0602 03/16/18 0529  WBC 17.0* 17.7*  NEUTROABS 14.2*  --   HGB 9.8* 9.2*  HCT 32.0* 30.3*  MCV 84.0 84.4  PLT 301 947    Basic Metabolic Panel:  Recent Labs  Lab 03/28/2018 0354 03/15/18 0602 03/15/18 0920 03/16/18 0529  NA 127*  --  133* 135  K 3.8  --  4.0 4.0  CL 98  --  105 110  CO2 21*  --  18* 15*  GLUCOSE 128*  --  101* 124*  BUN 48*  --  35* 30*  CREATININE 1.28*  --  0.88 0.97  CALCIUM 8.0*  --  7.9* 8.3*  MG 2.3  --   --  2.2  PHOS  --  3.1  --   --     Lipid Panel:     Component Value Date/Time   CHOL 71 03/15/2018 0602   CHOL 112 12/07/2016 0812   TRIG 64 03/15/2018 0602   HDL 26 (L) 03/15/2018 0602   HDL 32 (L) 12/07/2016 0812   CHOLHDL 2.7 03/15/2018 0602   VLDL 13 03/15/2018 0602   LDLCALC 32 03/15/2018 0602   LDLCALC 59 12/07/2016 0812   HgbA1c:  Lab Results  Component Value Date   HGBA1C 6.1 (H) 03/15/2018   Urine Drug Screen: No results found for: LABOPIA, COCAINSCRNUR, LABBENZ, AMPHETMU, THCU, LABBARB  Alcohol Level No results found for: ETH  IMAGING   Ct Angio Head W Or Wo Contrast Ct Angio Neck W Or Wo Contrast 03/07/2018 IMPRESSION:  1. Emergent large vessel occlusion involving the proximal right M1 segment.  2. Very poor collateral flow to the right MCA distribution.  3. High-grade stenosis, possibly occlusion, of the  dominant left vertebral artery.  4. Opacification of the left vertebral artery above the V1 segment may be retrograde flow.  5. Mild diffuse small vessel disease within the ACA branch vessels, left MCA branch vessels, and posterior circulation.  6. Extensive atherosclerotic calcification and vessel wall irregularity at the carotid bifurcations bilaterally without significant stenosis.  7. Extensive atherosclerotic changes within the cavernous internal carotid arteries bilaterally without significant stenosis.  8. Atherosclerotic changes at the aortic arch and particularly the left subclavian artery without significant stenosis.     Dg Chest Port 1 View 03/15/2018 IMPRESSION:  Appliances appear in satisfactory location. Cardiac enlargement. Left perihilar mass or consolidation with consolidation or atelectasis in the left lower lung, unchanged.     Ct Head Code Stroke Wo Contrast 03/10/2018 IMPRESSION:  1. Acute right MCA territory infarct with decreased density in the right lentiform nucleus, right insular cortex, and right M1 and M2 cortex.  2. Hyperdense right MCA suggesting acute thrombus.  3. ASPECTS is 6/10  Transthoracic Echocardiogram  03/10/2017 Study Conclusions - Left ventricle: The cavity size was normal. Wall thickness was   increased in a pattern of mild LVH. Systolic function was   severely reduced. The estimated ejection fraction was in the   range of 15% to 20%. Diffuse hypokinesis. Features are consistent   with a pseudonormal left ventricular filling pattern, with   concomitant abnormal relaxation and increased filling pressure   (grade 2 diastolic dysfunction). Doppler parameters are   consistent with high ventricular filling pressure. - Aortic valve: Trileaflet; mildly thickened, mildly calcified   leaflets. - Mitral valve: There was moderate regurgitation. - Left atrium: The atrium was severely dilated. - Right ventricle: Systolic function was moderately  reduced. - Right atrium: The atrium was severely dilated. - Tricuspid valve: There was moderate regurgitation. - Pulmonary arteries: Systolic pressure was mildly increased. PA   peak pressure: 41 mm Hg (S). Impressions: - EF has reduced from prior study (40-45%)   [name] Wo Contrast  Result Date: 03/15/2018 CLINICAL DATA:  Status post revascularization of right M1 occlusion. Right MCA infarct. EXAM: MRI HEAD WITHOUT CONTRAST MRA HEAD WITHOUT CONTRAST TECHNIQUE: Multiplanar, multiecho pulse sequences of the brain and surrounding structures were obtained without intravenous contrast. Angiographic images of the head were obtained using MRA technique without contrast. COMPARISON:  CTA head and neck 03/14/2017. FINDINGS: MRI HEAD FINDINGS Brain: Diffusion-weighted images demonstrate a large right MCA territory infarct involving the right frontal and parietal lobe. There is involving the superior temporal lobe and insular cortex. Right lentiform nucleus and caudate are involved. T2 and FLAIR sequences demonstrate diffuse edema. There is petechial hemorrhage throughout the infarct territory. Petechial hemorrhage is noted in the right basal ganglia as well. No parenchymal hemorrhage is present. There is mass effect with edema in the cortex. There is partial effacement of the right lateral ventricle. Midline shift measures 6 mm at the foramen of Monro. No new infarct is present. Moderate diffuse white matter disease is present in the left hemisphere. Brainstem is within normal limits. Remote lacunar infarcts are again noted in the cerebellum bilaterally. The internal auditory canals are within normal limits. Vascular: Flow is present in the major intracranial arteries. Skull and upper cervical spine: Fluid in the nasopharynx is likely related to intubation. Exaggerated cervical lordosis is again noted. Sinuses/Orbits: Mild mucosal thickening is present throughout the ethmoid air cells. There is minimal  mucosal thickening in the right maxillary sinus. Small mastoid effusions are present. Bilateral lens replacements are noted. Globes and orbits are otherwise unremarkable. MRA HEAD FINDINGS The internal carotid arteries are patent bilaterally. Mild atherosclerotic changes are noted in the cavernous segments. Flow is maintained within the right ICA, A1, and M1 segments. Left A1 and M1 segments are normal. ACA and MCA branch vessels are normal bilaterally. The vertebrobasilar junction is below the field of view. Basilar artery is normal. AICA vessels are normal. Both posterior cerebral arteries originate from basilar tip. A left posterior communicating artery is present. PCA branch vessels are normal. IMPRESSION: 1. 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 2. Petechial hemorrhage throughout the infarct without parenchymal hemorrhage. 3. Mass effect with partial effacement of the right lateral ventricle and 6 mm of right-to-left midline shift. 4. Left cerebral hemisphere white matter disease consistent with underlying microvascular ischemia. 5. MRA demonstrates persistent patent flow within the right ICA and MCA. Electronically Signed   By: San Morelle M.D.   On: 03/15/2018 16:12   Mr Brain Wo Contrast  Result Date: 03/15/2018 CLINICAL DATA:  Status post revascularization of right M1 occlusion. Right MCA infarct. EXAM: MRI HEAD WITHOUT CONTRAST MRA HEAD WITHOUT CONTRAST TECHNIQUE: Multiplanar, multiecho pulse sequences of the brain and surrounding structures were obtained without intravenous contrast. Angiographic images of the head were obtained using MRA technique without contrast. COMPARISON:  CTA head and neck 03/14/2017. FINDINGS: MRI HEAD FINDINGS Brain: Diffusion-weighted images demonstrate a large right MCA territory infarct involving the right frontal and parietal lobe. There is involving the superior temporal lobe and insular cortex. Right lentiform nucleus and caudate  are involved. T2 and FLAIR sequences demonstrate diffuse edema. There is petechial hemorrhage throughout the infarct territory. Petechial hemorrhage is noted in the right basal ganglia as well. No parenchymal hemorrhage is present. There is mass effect with edema in the cortex. There is partial effacement of the right lateral ventricle. Midline shift measures 6 mm at the foramen of Monro. No new infarct is present. Moderate diffuse white matter disease is present in the left hemisphere. Brainstem is within normal limits. Remote lacunar infarcts are again noted in the cerebellum bilaterally. The internal auditory canals are within normal limits. Vascular: Flow is present in the major intracranial arteries. Skull and upper cervical spine: Fluid in the nasopharynx is likely related to intubation. Exaggerated cervical lordosis is again noted. Sinuses/Orbits: Mild mucosal thickening is present throughout the ethmoid air cells. There is minimal mucosal thickening in the right maxillary sinus. Small mastoid effusions are present. Bilateral lens replacements are noted. Globes and orbits are otherwise unremarkable. MRA HEAD FINDINGS The internal carotid arteries are patent bilaterally. Mild atherosclerotic changes are noted in the cavernous segments. Flow is maintained within the right ICA, A1, and M1 segments. Left A1 and M1 segments are normal. ACA and MCA branch vessels are normal bilaterally. The vertebrobasilar junction is below the field of view. Basilar artery is normal. AICA vessels are normal. Both posterior cerebral arteries originate from basilar tip. A left posterior communicating artery is present. PCA branch vessels are normal. IMPRESSION: 1. 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 2. Petechial hemorrhage throughout the infarct without parenchymal hemorrhage. 3. Mass effect with partial effacement of the right lateral ventricle and 6 mm of right-to-left midline shift. 4. Left cerebral  hemisphere white matter disease consistent with underlying microvascular ischemia. 5. MRA demonstrates persistent patent flow within the right ICA and MCA. Electronically Signed   By: San Morelle M.D.   On: 03/15/2018 16:12     PHYSICAL EXAM  Temp:  [97.9 F (36.6 C)-100.8 F (38.2 C)] 98.1 F (36.7 C) (01/12 0800) Pulse Rate:  [75-120] 120 (01/12 0900) Resp:  [0-21] 17 (01/11 2000) BP: (99-125)/(43-98) 105/58 (01/12 0900) SpO2:  [92 %-100 %] 100 % (01/12 0900) Arterial Line BP: (100-145)/(41-64) 130/54 (01/12 0900) FiO2 (%):  [40 %] 40 % (01/12 0805) Weight:  [79.6 kg] 79.6 kg (01/12 0300)  General - Well nourished, well developed, lethargic just extubated.  Ophthalmologic - fundi not visualized due to noncooperation.  Cardiovascular - irregularly irregular heart rate and rhythm.  Neuro - just extubated, lethargic but eyes open, awake alert, able to following simple commands on the right. Seems to have right neglect. Right gaze preference but able to cross midline, left gaze incomplete, PERRL, blinking to visual threat on the right but not on the left. Mild left facial asymmetry. Tongue midline in mouth. RUE 4/5 and RLE 3/5, but left UE withdraw to pain and LLE 2/5 proximally on pain but able to  wiggle toes on command on the left. DTR 1+ and left babinski positive. Sensation, coordination not cooperative and gait not tested.   ASSESSMENT/PLAN Kelly Benjamin is a 78 y.o. female with history of coronary artery disease, ischemic cardiomyopathy with EF in the past to 40%, iron deficiency anemia as a child  presenting with CHF, elevated troponin, dysarthria and Lt sided weakness.  She did not receive IV t-PA due to recent GI bleeding and severe anemia and late presentation.  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 fib and cardiomyopathy with low EF  CT head - Acute right MCA territory infarct with  decreased density in the right lentiform nucleus, right insular cortex, and right M1 and M2 cortex. Hyperdense right MCA suggesting acute thrombus.   MRI head right MCA large infarct with diffuse petechial hemorrhage  MRA head right MCA patent with luxury perfusion  CTA H&N - proximal right M1 LVO. High-grade stenosis, possibly occlusion of the dominant left vertebral artery.   2D Echo  - EF 15-20% decrease from prior 40 to 45%. No cardiac source of emboli identified.   LDL - 32  HgbA1c - 6.1  VTE prophylaxis - SCDs  Diet - NPO  aspirin 81 mg daily and clopidogrel 75 mg daily prior to admission, now on aspirin 325 mg daily. Will consider DOAC in 7 days post stroke given size of infarct and hemorrhagic conversion  Patient counseled to be compliant with her antithrombotic medications  Ongoing aggressive stroke risk factor management  Therapy recommendations:  pending  Disposition:  Pending  Cerebral edema  MRI showed large right MCA infarct but spare most BG and CR.   MLS about 54mm  MRA showed right MCA luxury reperfusion  Clinically stable at this time  No need 3% at this time  Close monitoring clinically and Na level  New onset Afib  In the setting of pneumonia, anemia and non-STEMI  Currently NSR  Rate controlled  We will consider DOAC 7 days post stroke given the size of infarct  Cardiomyopathy with low EF  2D echo showed EF 15 to 20% down from prior 40 to 45%  CCM on board  Avoid fluid overload  CXR Masslike opacity in the left mid lung. No overt pulmonary edema.   OK for diuresis if needed as BP improved  Non-STEMI  Cardiology on board  On aspirin  We will consider DOAC at day 7 post stroke  Respiratory failure  Intubated for procedure  CCM on board  Extubate today  Close monitoring  Hypotension History of hypertension  BP runs low but improved  Off Levophed now . BP goal 110-140 post procedure . continue NS @ 75 . Diuresis  if needed  Hyperlipidemia  Lipid lowering medication PTA:  Lipitor 40 mg daily and Zetia 10 mg daily  LDL 32, goal < 70  Current lipid lowering medication: Lipitor 20 mg daily and Zetia 10 mg daily  Continue statin at discharge  Recent pneumonia  CXR showed Masslike opacity in the left mid lung. Unchanged consolidation or atelectasis in the left lower lung  Leukocytosis 17.0->17.7  On cefepime  CCM on board  Fever Tmax 100.8  Hyponatremia  Sodium 127-133->135  Continue monitoring  Continue NS @ 75  Severe anemia  Hemoglobin 5.6-PRBC-9.8->9.2  GI on board plan for endoscopy once patient stable  CBC monitoring  Other Stroke Risk Factors  Advanced age  Former cigarette smoker - quit  ETOH use, advised to drink no more than 1 alcoholic beverage per day.  Coronary artery disease  Other Active Problems  Elevated creatinine 1.57-1.28-0.88->0.97   Hospital day # 8  This patient is critically ill due to right MCA stroke, A. fib RVR, cardiomyopathy with low EF, pneumonia, non-STEMI, anemia need blood transfusion, new onset A. fib and at significant risk of neurological worsening, death form recurrent stroke, hemorrhagic conversion, seizure, heart failure, cardiogenic shock. This patient's care requires constant monitoring of vital signs, hemodynamics, respiratory and cardiac monitoring, review of multiple databases, neurological assessment, discussion with family, other specialists and medical decision making of high complexity. I spent 45 minutes of neurocritical care time in the care of this patient.  I discussed with CCM PA.  Rosalin Hawking, MD PhD Stroke Neurology 03/16/2018 9:46 AM     To contact Stroke Continuity provider, please refer to [email or web address] After hours, contact General Neurology

## 2018-03-16 NOTE — Progress Notes (Addendum)
Progress Note  Patient Name: Kelly Benjamin Date of Encounter: 03/16/2018  Primary Cardiologist: Quay Burow, MD   Subjective   Extubated. She responds with a right hand wave to my questions and comments. Audible "thank you", otherwise no clear speech during my visit.  Inpatient Medications    Scheduled Meds: .  stroke: mapping our early stages of recovery book   Does not apply Once  . arformoterol  15 mcg Nebulization BID  . aspirin  325 mg Oral Daily  . atorvastatin  20 mg Oral QHS  . budesonide (PULMICORT) nebulizer solution  0.25 mg Nebulization BID  . chlorhexidine gluconate (MEDLINE KIT)  15 mL Mouth Rinse BID  . ezetimibe  10 mg Oral Daily  . ipratropium-albuterol  3 mL Nebulization TID  . mouth rinse  15 mL Mouth Rinse 10 times per day  . multivitamin  1 tablet Oral BID  . pantoprazole (PROTONIX) IV  40 mg Intravenous Q24H   Continuous Infusions: . sodium chloride 50 mL/hr at 03/16/18 1111   PRN Meds: acetaminophen **OR** acetaminophen (TYLENOL) oral liquid 160 mg/5 mL **OR** acetaminophen, bisacodyl, docusate, ondansetron (ZOFRAN) IV   Vital Signs    Vitals:   03/16/18 0804 03/16/18 0900 03/16/18 1000 03/16/18 1100  BP:  (!) 105/58 (!) 76/41 (!) 95/54  Pulse:  (!) 120 (!) 104 (!) 103  Resp:      Temp:      TempSrc:      SpO2: 100% 100% 97% 99%  Weight:      Height:        Intake/Output Summary (Last 24 hours) at 03/16/2018 1234 Last data filed at 03/16/2018 0800 Gross per 24 hour  Intake 1974.76 ml  Output 1091 ml  Net 883.76 ml   Filed Weights   03/29/2018 0300 03/15/18 0500 03/16/18 0300  Weight: 69.3 kg 69.3 kg 79.6 kg    Telemetry    Afib, rate generally controlled, occ PVCs - Personally Reviewed  Physical Exam   Constitutional: lying in bed, lethargic Cardiovascular: irregular rhythm, normal rate, no murmurs. S1 and S2 normal. Radial pulses normal bilaterally. No jugular venous distention.  Respiratory: clear GI : normal bowel  sounds, soft and nontender. No distention.   MSK: extremities warm, well perfused. No edema.   Labs    Chemistry Recent Labs  Lab 03/17/2018 0400  03/12/18 0518  04/04/2018 0354 03/15/18 0920 03/16/18 0529  NA 135   < >  --    < > 127* 133* 135  K 3.9   < >  --    < > 3.8 4.0 4.0  CL 105   < >  --    < > 98 105 110  CO2 15*   < >  --    < > 21* 18* 15*  GLUCOSE 140*   < >  --    < > 128* 101* 124*  BUN 32*   < >  --    < > 48* 35* 30*  CREATININE 1.59*   < >  --    < > 1.28* 0.88 0.97  CALCIUM 9.2   < >  --    < > 8.0* 7.9* 8.3*  PROT 6.2*  --  5.3*  --   --   --   --   ALBUMIN 2.4*  --  2.0*  --   --   --   --   AST 214*  --  68*  --   --   --   --  ALT 172*  --  136*  --   --   --   --   ALKPHOS 79  --  435*  --   --   --   --   BILITOT 1.1  --  0.9  --   --   --   --   GFRNONAA 31*   < >  --    < > 41* >60 57*  GFRAA 36*   < >  --    < > 47* >60 >60  ANIONGAP 15   < >  --    < > '8 10 10   '$ < > = values in this interval not displayed.     Hematology Recent Labs  Lab 03/16/2018 0354 03/15/18 0602 03/16/18 0529  WBC 15.4* 17.0* 17.7*  RBC 3.16* 3.81* 3.59*  HGB 7.9* 9.8* 9.2*  HCT 26.2* 32.0* 30.3*  MCV 82.9 84.0 84.4  MCH 25.0* 25.7* 25.6*  MCHC 30.2 30.6 30.4  RDW 19.9* 18.7* 19.0*  PLT 249 301 266    Cardiac Enzymes Recent Labs  Lab 03/09/18 1557  TROPONINI 1.30*     Radiology    Ct Angio Head W Or Wo Contrast  Result Date: 03/19/2018 CLINICAL DATA:  Acute onset of left-sided weakness 2 hours ago. EXAM: CT ANGIOGRAPHY HEAD AND NECK TECHNIQUE: Multidetector CT imaging of the head and neck was performed using the standard protocol during bolus administration of intravenous contrast. Multiplanar CT image reconstructions and MIPs were obtained to evaluate the vascular anatomy. Carotid stenosis measurements (when applicable) are obtained utilizing NASCET criteria, using the distal internal carotid diameter as the denominator. CONTRAST:  114m ISOVUE-370 IOPAMIDOL  (ISOVUE-370) INJECTION 76% COMPARISON:  CT head without contrast 03/07/2018 FINDINGS: CTA NECK FINDINGS Aortic arch: Atherosclerotic calcifications are present at the aortic arch. There is no aneurysm. No significant stenosis is present at the great vessel origins. Right carotid system: The right common carotid artery is tortuous with atherosclerotic changes throughout the course of the right common carotid artery. Calcifications extend through the bifurcation to the proximal right ICA without a significant stenosis relative to the more distal vessel. The cervical right ICA is within normal limits. Left carotid system: Minimal atherosclerotic calcifications are present within the left common carotid artery. Dense calcifications are present at the left carotid bifurcation. There is no significant stenosis relative to the more distal vessel. Atherosclerotic changes extend over 3 cm from the bifurcation. The cervical left ICA is otherwise normal. Vertebral arteries: The proximal left vertebral artery demonstrates a high-grade stenosis with extensive calcification. Opacification of the more distal left vertebral artery may reflect retrograde flow. No definite lumen is seen proximally. The non dominant right vertebral artery origin is within normal limits. There is no significant stenosis to the right vertebral artery in the neck. Atherosclerotic changes are present at C1-2 on the left without a significant stenosis. Skeleton: Multilevel degenerative changes are present in the cervical spine, particularly facet disease. This results in slight anterolisthesis at C4-5. No focal lytic or blastic lesions are present. Other neck: The soft tissues the neck demonstrate no focal mucosal or submucosal lesions. Thyroid is within normal limits. No significant adenopathy is present. Salivary glands are normal. Upper chest: Increasing bilateral pleural effusions are present. Right upper lobe consolidated pneumonia is again seen. Mild  edema is present. Review of the MIP images confirms the above findings CTA HEAD FINDINGS Anterior circulation: Atherosclerotic changes are present throughout the cavernous right internal carotid artery without a significant stenosis  through the ICA terminus. Calcifications are present in the left cavernous internal carotid artery without a significant stenosis. A proximal right M1 segment occlusion is present. Right A1 segment is patent. Left A1 and M1 segments are within normal limits. The left MCA bifurcation is intact. Left MCA branch vessels are within normal limits. There is mild distal small vessel disease of the ACA vessels. No significant right MCA collateralization is present. Posterior circulation: The left vertebral artery is the dominant vessel. The hypoplastic right vertebral artery essentially terminates at the PICA sending only very small branch to the vertebrobasilar junction. There is mild narrowing of the distal left V4 segment. Basilar artery is small. Both posterior cerebral arteries originate from the basilar tip. A small left posterior communicating artery is present. PCA branch vessels demonstrate mild irregularity without a significant proximal stenosis or occlusion. Venous sinuses: The dural sinuses are patent. Straight sinus and deep cerebral veins are intact. Cortical veins are unremarkable. Anatomic variants: None Review of the MIP images confirms the above findings IMPRESSION: 1. Emergent large vessel occlusion involving the proximal right M1 segment. 2. Very poor collateral flow to the right MCA distribution. 3. 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 4. Opacification of the left vertebral artery above the V1 segment may be retrograde flow. 5. Mild diffuse small vessel disease within the ACA branch vessels, left MCA branch vessels, and posterior circulation. 6. Extensive atherosclerotic calcification and vessel wall irregularity at the carotid  bifurcations bilaterally without significant stenosis. 7. Extensive atherosclerotic changes within the cavernous internal carotid arteries bilaterally without significant stenosis. 8. Atherosclerotic changes at the aortic arch and particularly the left subclavian artery without significant stenosis. The above was relayed via text pager to Dr. Roland Rack on 03/22/2018 at 20:55. Electronically Signed   By: San Morelle M.D.   On: 03/27/2018 21:17   Ct Angio Neck W Or Wo Contrast  Result Date: 03/07/2018 CLINICAL DATA:  Acute onset of left-sided weakness 2 hours ago. EXAM: CT ANGIOGRAPHY HEAD AND NECK TECHNIQUE: Multidetector CT imaging of the head and neck was performed using the standard protocol during bolus administration of intravenous contrast. Multiplanar CT image reconstructions and MIPs were obtained to evaluate the vascular anatomy. Carotid stenosis measurements (when applicable) are obtained utilizing NASCET criteria, using the distal internal carotid diameter as the denominator. CONTRAST:  151m ISOVUE-370 IOPAMIDOL (ISOVUE-370) INJECTION 76% COMPARISON:  CT head without contrast 03/07/2018 FINDINGS: CTA NECK FINDINGS Aortic arch: Atherosclerotic calcifications are present at the aortic arch. There is no aneurysm. No significant stenosis is present at the great vessel origins. Right carotid system: The right common carotid artery is tortuous with atherosclerotic changes throughout the course of the right common carotid artery. Calcifications extend through the bifurcation to the proximal right ICA without a significant stenosis relative to the more distal vessel. The cervical right ICA is within normal limits. Left carotid system: Minimal atherosclerotic calcifications are present within the left common carotid artery. Dense calcifications are present at the left carotid bifurcation. There is no significant stenosis relative to the more distal vessel. Atherosclerotic changes extend over  3 cm from the bifurcation. The cervical left ICA is otherwise normal. Vertebral arteries: The proximal left vertebral artery demonstrates a high-grade stenosis with extensive calcification. Opacification of the more distal left vertebral artery may reflect retrograde flow. No definite lumen is seen proximally. The non dominant right vertebral artery origin is within normal limits. There is no significant stenosis to the right vertebral artery in the  neck. Atherosclerotic changes are present at C1-2 on the left without a significant stenosis. Skeleton: Multilevel degenerative changes are present in the cervical spine, particularly facet disease. This results in slight anterolisthesis at C4-5. No focal lytic or blastic lesions are present. Other neck: The soft tissues the neck demonstrate no focal mucosal or submucosal lesions. Thyroid is within normal limits. No significant adenopathy is present. Salivary glands are normal. Upper chest: Increasing bilateral pleural effusions are present. Right upper lobe consolidated pneumonia is again seen. Mild edema is present. Review of the MIP images confirms the above findings CTA HEAD FINDINGS Anterior circulation: Atherosclerotic changes are present throughout the cavernous right internal carotid artery without a significant stenosis through the ICA terminus. Calcifications are present in the left cavernous internal carotid artery without a significant stenosis. A proximal right M1 segment occlusion is present. Right A1 segment is patent. Left A1 and M1 segments are within normal limits. The left MCA bifurcation is intact. Left MCA branch vessels are within normal limits. There is mild distal small vessel disease of the ACA vessels. No significant right MCA collateralization is present. Posterior circulation: The left vertebral artery is the dominant vessel. The hypoplastic right vertebral artery essentially terminates at the PICA sending only very small branch to the  vertebrobasilar junction. There is mild narrowing of the distal left V4 segment. Basilar artery is small. Both posterior cerebral arteries originate from the basilar tip. A small left posterior communicating artery is present. PCA branch vessels demonstrate mild irregularity without a significant proximal stenosis or occlusion. Venous sinuses: The dural sinuses are patent. Straight sinus and deep cerebral veins are intact. Cortical veins are unremarkable. Anatomic variants: None Review of the MIP images confirms the above findings IMPRESSION: 1. Emergent large vessel occlusion involving the proximal right M1 segment. 2. Very poor collateral flow to the right MCA distribution. 3. 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 4. Opacification of the left vertebral artery above the V1 segment may be retrograde flow. 5. Mild diffuse small vessel disease within the ACA branch vessels, left MCA branch vessels, and posterior circulation. 6. Extensive atherosclerotic calcification and vessel wall irregularity at the carotid bifurcations bilaterally without significant stenosis. 7. Extensive atherosclerotic changes within the cavernous internal carotid arteries bilaterally without significant stenosis. 8. Atherosclerotic changes at the aortic arch and particularly the left subclavian artery without significant stenosis. The above was relayed via text pager to Dr. Roland Rack on 03/15/2018 at 20:55. Electronically Signed   By: San Morelle M.D.   On: 03/16/2018 21:17   Mr Jodene Nam Head Wo Contrast  Result Date: 03/15/2018 CLINICAL DATA:  Status post revascularization of right M1 occlusion. Right MCA infarct. EXAM: MRI HEAD WITHOUT CONTRAST MRA HEAD WITHOUT CONTRAST TECHNIQUE: Multiplanar, multiecho pulse sequences of the brain and surrounding structures were obtained without intravenous contrast. Angiographic images of the head were obtained using MRA technique without contrast.  COMPARISON:  CTA head and neck 03/14/2017. FINDINGS: MRI HEAD FINDINGS Brain: Diffusion-weighted images demonstrate a large right MCA territory infarct involving the right frontal and parietal lobe. There is involving the superior temporal lobe and insular cortex. Right lentiform nucleus and caudate are involved. T2 and FLAIR sequences demonstrate diffuse edema. There is petechial hemorrhage throughout the infarct territory. Petechial hemorrhage is noted in the right basal ganglia as well. No parenchymal hemorrhage is present. There is mass effect with edema in the cortex. There is partial effacement of the right lateral ventricle. Midline shift measures 6 mm at the  foramen of Monro. No new infarct is present. Moderate diffuse white matter disease is present in the left hemisphere. Brainstem is within normal limits. Remote lacunar infarcts are again noted in the cerebellum bilaterally. The internal auditory canals are within normal limits. Vascular: Flow is present in the major intracranial arteries. Skull and upper cervical spine: Fluid in the nasopharynx is likely related to intubation. Exaggerated cervical lordosis is again noted. Sinuses/Orbits: Mild mucosal thickening is present throughout the ethmoid air cells. There is minimal mucosal thickening in the right maxillary sinus. Small mastoid effusions are present. Bilateral lens replacements are noted. Globes and orbits are otherwise unremarkable. MRA HEAD FINDINGS The internal carotid arteries are patent bilaterally. Mild atherosclerotic changes are noted in the cavernous segments. Flow is maintained within the right ICA, A1, and M1 segments. Left A1 and M1 segments are normal. ACA and MCA branch vessels are normal bilaterally. The vertebrobasilar junction is below the field of view. Basilar artery is normal. AICA vessels are normal. Both posterior cerebral arteries originate from basilar tip. A left posterior communicating artery is present. PCA branch vessels  are normal. IMPRESSION: 1. 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 2. Petechial hemorrhage throughout the infarct without parenchymal hemorrhage. 3. Mass effect with partial effacement of the right lateral ventricle and 6 mm of right-to-left midline shift. 4. Left cerebral hemisphere white matter disease consistent with underlying microvascular ischemia. 5. MRA demonstrates persistent patent flow within the right ICA and MCA. Electronically Signed   By: San Morelle M.D.   On: 03/15/2018 16:12   Mr Brain Wo Contrast  Result Date: 03/15/2018 CLINICAL DATA:  Status post revascularization of right M1 occlusion. Right MCA infarct. EXAM: MRI HEAD WITHOUT CONTRAST MRA HEAD WITHOUT CONTRAST TECHNIQUE: Multiplanar, multiecho pulse sequences of the brain and surrounding structures were obtained without intravenous contrast. Angiographic images of the head were obtained using MRA technique without contrast. COMPARISON:  CTA head and neck 03/14/2017. FINDINGS: MRI HEAD FINDINGS Brain: Diffusion-weighted images demonstrate a large right MCA territory infarct involving the right frontal and parietal lobe. There is involving the superior temporal lobe and insular cortex. Right lentiform nucleus and caudate are involved. T2 and FLAIR sequences demonstrate diffuse edema. There is petechial hemorrhage throughout the infarct territory. Petechial hemorrhage is noted in the right basal ganglia as well. No parenchymal hemorrhage is present. There is mass effect with edema in the cortex. There is partial effacement of the right lateral ventricle. Midline shift measures 6 mm at the foramen of Monro. No new infarct is present. Moderate diffuse white matter disease is present in the left hemisphere. Brainstem is within normal limits. Remote lacunar infarcts are again noted in the cerebellum bilaterally. The internal auditory canals are within normal limits. Vascular: Flow is present in the major  intracranial arteries. Skull and upper cervical spine: Fluid in the nasopharynx is likely related to intubation. Exaggerated cervical lordosis is again noted. Sinuses/Orbits: Mild mucosal thickening is present throughout the ethmoid air cells. There is minimal mucosal thickening in the right maxillary sinus. Small mastoid effusions are present. Bilateral lens replacements are noted. Globes and orbits are otherwise unremarkable. MRA HEAD FINDINGS The internal carotid arteries are patent bilaterally. Mild atherosclerotic changes are noted in the cavernous segments. Flow is maintained within the right ICA, A1, and M1 segments. Left A1 and M1 segments are normal. ACA and MCA branch vessels are normal bilaterally. The vertebrobasilar junction is below the field of view. Basilar artery is normal. AICA vessels are normal.  Both posterior cerebral arteries originate from basilar tip. A left posterior communicating artery is present. PCA branch vessels are normal. IMPRESSION: 1. 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 2. Petechial hemorrhage throughout the infarct without parenchymal hemorrhage. 3. Mass effect with partial effacement of the right lateral ventricle and 6 mm of right-to-left midline shift. 4. Left cerebral hemisphere white matter disease consistent with underlying microvascular ischemia. 5. MRA demonstrates persistent patent flow within the right ICA and MCA. Electronically Signed   By: San Morelle M.D.   On: 03/15/2018 16:12   Dg Chest Port 1 View  Result Date: 03/15/2018 CLINICAL DATA:  Follow-up CHF. EXAM: PORTABLE CHEST 1 VIEW COMPARISON:  04/04/2018 and older exams. FINDINGS: Masslike area of consolidation in the left mid lung is stable. There are stable prominent bronchovascular and interstitial markings bilaterally. There is confluent opacity at the left lung base, unchanged. Small left and probable small right pleural effusions. No pneumothorax. Endotracheal tube  tip projects 3.3 cm above the Carina. Nasal/orogastric tube passes below the diaphragm into the stomach. IMPRESSION: 1. Similar appearance to the previous day's study. 2. Masslike opacity in the left mid lung. Left lung base opacity most likely atelectasis although pneumonia is possible. Small left and probable small right pleural effusions. No overt pulmonary edema. There are chronically prominent bronchovascular and interstitial markings. Electronically Signed   By: Lajean Manes M.D.   On: 03/15/2018 19:17   Dg Chest Port 1 View  Result Date: 03/15/2018 CLINICAL DATA:  Intubation EXAM: PORTABLE CHEST 1 VIEW COMPARISON:  03/11/2018 FINDINGS: Endotracheal tube placed with tip measuring 3.3 cm above the carina. Enteric tube placed. Tip is off the field of view but below the left hemidiaphragm. Cardiac enlargement. No vascular congestion. Left perihilar mass or consolidation with consolidation or atelectasis in the left lower lung as well. Similar appearance to previous study, lying for differences in technique. No pneumothorax. Calcification of the aorta. IMPRESSION: Appliances appear in satisfactory location. Cardiac enlargement. Left perihilar mass or consolidation with consolidation or atelectasis in the left lower lung, unchanged. Electronically Signed   By: Lucienne Capers M.D.   On: 03/15/2018 00:16   Ct Head Code Stroke Wo Contrast  Result Date: 04/02/2018 CLINICAL DATA:  Code stroke. Left-sided weakness and slurred speech. EXAM: CT HEAD WITHOUT CONTRAST TECHNIQUE: Contiguous axial images were obtained from the base of the skull through the vertex without intravenous contrast. COMPARISON:  None. FINDINGS: Brain: Mild atrophy and white matter changes are within normal limits for age. No acute hemorrhage or mass lesion is present. There is decreased density of the right lentiform nucleus and insular ribbon. M1 and M2 gray-white differentiation is decreased. Super ganglionic cortex is within normal  limits. Ventricles are of normal size. No significant extraaxial fluid collection is present. Remote lacunar infarcts are present in the right cerebellum. Brainstem is within normal limits. Vascular: A hyperdense right MCA is present. Atherosclerotic calcifications are present bilaterally. Skull: Calvarium is intact. No focal lytic or blastic lesions are present. Sinuses/Orbits: The paranasal sinuses and mastoid air cells are clear. Bilateral lens replacements are noted. Globes and orbits are otherwise unremarkable. ASPECTS [facility] Stroke Program Early CT Score) - Ganglionic level infarction (caudate, lentiform nuclei, internal capsule, insula, M1-M3 cortex): 3/7 - Supraganglionic infarction (M4-M6 cortex): 3/3 Total score (0-10 with 10 being normal): 6/10 IMPRESSION: 1. Acute right MCA territory infarct with decreased density in the right lentiform nucleus, right insular cortex, and right M1 and M2 cortex. 2. Hyperdense right MCA  suggesting acute thrombus. 3. ASPECTS is 6/10 The above was relayed via text pager to Dr. Roland Rack on 03/26/2018 at 20:55 . Electronically Signed   By: San Morelle M.D.   On: 04/03/2018 20:57    Cardiac Studies   Echocardiogram 06/20/2016: Study Conclusions: - Left ventricle: The cavity size was normal. There was mild   concentric hypertrophy. Systolic function was mildly to   moderately reduced. The estimated ejection fraction was in the   range of 40% to 45%. Hypokinesis of the anterior and   anterolateral with akinesis of the inferolateral myocardium.   Doppler parameters are consistent with abnormal left ventricular   relaxation (grade 1 diastolic dysfunction). Doppler parameters   are consistent with indeterminate ventricular filling pressure. - Aortic valve: Transvalvular velocity was within the normal range.   There was no stenosis. There was no regurgitation. - Mitral valve: Transvalvular velocity was within the normal range.   There was no  evidence for stenosis. There was mild regurgitation. - Right ventricle: Systolic function was normal. - Atrial septum: No defect or patent foramen ovale was identified   by color flow Doppler. - Tricuspid valve: There was trivial regurgitation. - Pulmonary arteries: Systolic pressure was within the normal   range. PA peak pressure: 21 mm Hg (S). _______________  Left Heart Catheterization 03/02/2016:  Prox RCA to Mid RCA lesion, 90 %stenosed.  Dist LAD lesion, 85 %stenosed.    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 The right coronary is a very large territory and also has left right collaterals from the left coronary system. The likely scenario is the patient's inferolateral infarction occurred due to thrombotic occlusion and she subsequently had on pain is recanalization.  Widely patent circumflex.  Diffuse 85-90% apical LAD disease.  Elevated left ventricular end-diastolic pressure. Previously documented LVEF in the 30-35% range by noninvasive imaging.  Recommendations:  In absence of angina or ischemia on nuclear scintigraphy, medical therapy would appear to be the treatment of choice.  LV dysfunction is likely secondary to chronic remodeling following inferolateral infarction which occurred remotely.  Guideline mandated heart failure therapy. Discontinuation amlodipine will allow further up titration of heart failure agents.  Patient Profile   Ms. Rumer is a 78 y.o. female with a history of of CAD, ischemic cardiomyopathy with EF of 40-45% on Echo in 06/2016, hyperlipidemia, COPD, and left breast cancer s/p radiation and lumpectomy who presented to [facility] ED on 03/08/2017 for evaluation of worsening shortness of breath and edema. On arrival, she was found to have profound anemia with a hemoglobin of 5.6. Chest x-ray consistent with left upper lobe pneumonia. Troponin also elevated. Cardiology was consulted for  acute on chronic systolic CHF and NSTEMI.  Assessment & Plan    Acute Hypoxemic Respiratory Failure  - per primary MDs, extubated today  Acute Combined Systolic and Diastolic CHF  - EF newly 38-75%  (40-45%) - likely ischemic - I/O +4.8 L since admit, no Lasix right now due to hypotension - no ACE/ARB or BB due to low BP  New Onset Atrial Fibrillation  - new dx - HR is generally controlled, some low rates but not sustained - no rate-lowering rx with hypotension, rates are reasonably controlled currently - AC to be started at the discretion of neurology post stroke. -likely afib + reduced EF contributed to embolic event.   Elevated Troponin with Known CAD - peak Trop 1.35 - was on ASA (d/c'd), on statin, BB  ordered but never given due to low BP/HR - no coronary angiogram anticipated during this hospital stay.  Acute Anemia of Unknown Etiology - Hgb (prev nl) 5.6 on admit - GI following  Community Acquired Pneumonia - ABX per primary MDs  Time Spent Directly with Patient:  I have spent a total of 30 minutes with the patient reviewing hospital notes, telemetry, EKGs, labs and examining the patient as well as establishing an assessment and plan that was discussed personally to the patient.  > 50% of time was spent in direct patient care. I spoke with [name] and Remo Lipps (friends) at length today, and communicated our thoughts to the patient as well.    For questions or updates, please contact [place] Please consult [email or web address] for contact info under        Signed, Elouise Munroe, MD  03/16/2018, 12:34 PM

## 2018-03-16 NOTE — Evaluation (Signed)
Clinical/Bedside Swallow Evaluation Patient Details  Name: Kelly Benjamin MRN: [id number] Date of Birth: 13-Jul-1940  Today's Date: 03/16/2018 Time: SLP Start Time (ACUTE ONLY): 72 SLP Stop Time (ACUTE ONLY): 1518 SLP Time Calculation (min) (ACUTE ONLY): 14 min  Past Medical History:  Past Medical History:  Diagnosis Date  . Breast cancer ([place]) 09/2017   left  . CAD in native artery 02/28/2016   2V CAD -medical rx  . COPD (chronic obstructive pulmonary disease) ([place])   . Dyslipidemia, goal LDL below 70   . Hypertension   . Ischemic cardiomyopathy    EF improved to 40-45% on Entresto  . Personal history of radiation therapy    Past Surgical History:  Past Surgical History:  Procedure Laterality Date  . APPENDECTOMY    . BREAST LUMPECTOMY  09/2017  . BREAST SURGERY  2000  . CARDIAC CATHETERIZATION N/A 03/02/2016   Procedure: Left Heart Cath and Coronary Angiography;  Surgeon: Belva Crome, MD;  Location: [facility];  Service: Cardiovascular;  Laterality: N/A;  . CATARACT EXTRACTION    . KNEE SURGERY     HPI:  78 year old with ischemic cardiomyopathy admitted with pneumonia, non-ST elevation MI, symptomatic anemia, atrial fibrillation.  Developed acute ischemic stroke status post IR thrombectomy. Intubated from 1/10 to 1/12. Remains hemi-paretic on the left side, weaning.  MRI of brain: Showing a large right MCA territory infarct involving the basal ganglia there are petechial hemorrhage throughout the infarct area without parenchymal hemorrhage.  There is partial effacement of the right lateral ventricle with 6 mm right to left shift.     Assessment / Plan / Recommendation Clinical Impression  Pt demonstrates poor secretion management at baseline including weak congested cough and wet vocal quality. Secretions suctioned from base of tongue with improvement, but immediate return after trials of ice chips. There appears to be both a neuromuscular deficit given left CN VII  and XII weakness and probable decreased glottic compentence following extubation today. Pt at high risk of aspiration at this time, not ready for PO. Will f/u for trials and need for instrumental testing as pt progresses. Educated pt and family.   SLP Visit Diagnosis: Dysphagia, oropharyngeal phase (R13.12)    Aspiration Risk       Diet Recommendation NPO   Medication Administration: Via alternative means    Other  Recommendations Oral Care Recommendations: Oral care QID   Follow up Recommendations Inpatient Rehab      Frequency and Duration min 2x/week  2 weeks       Prognosis Prognosis for Safe Diet Advancement: Good      Swallow Study   General HPI: 78 year old with ischemic cardiomyopathy admitted with pneumonia, non-ST elevation MI, symptomatic anemia, atrial fibrillation.  Developed acute ischemic stroke status post IR thrombectomy. Intubated from 1/10 to 1/12. Remains hemi-paretic on the left side, weaning.  MRI of brain: Showing a large right MCA territory infarct involving the basal ganglia there are petechial hemorrhage throughout the infarct area without parenchymal hemorrhage.  There is partial effacement of the right lateral ventricle with 6 mm right to left shift.   Type of Study: Bedside Swallow Evaluation Previous Swallow Assessment: none Diet Prior to this Study: NPO Temperature Spikes Noted: No Respiratory Status: Nasal cannula History of Recent Intubation: Yes Length of Intubations (days): 2 days Date extubated: 03/16/18 Behavior/Cognition: Alert;Cooperative Oral Cavity Assessment: Excessive secretions Oral Care Completed by SLP: Yes Oral Cavity - Dentition: Adequate natural dentition Vision: Functional for self-feeding Self-Feeding Abilities: Able  to feed self Patient Positioning: Upright in bed Baseline Vocal Quality: Hoarse;Breathy;Wet Volitional Cough: Weak;Congested Volitional Swallow: Able to elicit    Oral/Motor/Sensory Function Overall Oral  Motor/Sensory Function: Moderate impairment Facial ROM: Reduced left;Suspected CN VII (facial) dysfunction Facial Symmetry: Abnormal symmetry left;Suspected CN VII (facial) dysfunction Facial Strength: Reduced left;Suspected CN VII (facial) dysfunction Lingual ROM: Reduced left;Suspected CN XII (hypoglossal) dysfunction Lingual Symmetry: Abnormal symmetry left;Suspected CN XII (hypoglossal) dysfunction Lingual Strength: Reduced   Ice Chips Ice chips: Impaired Presentation: Spoon Pharyngeal Phase Impairments: Wet Vocal Quality;Multiple swallows;Cough - Delayed   Thin Liquid Thin Liquid: Not tested    Nectar Thick Nectar Thick Liquid: Not tested   Honey Thick Honey Thick Liquid: Not tested   Puree Puree: Not tested   Solid     Solid: Not tested     Herbie [place], MA CCC-SLP  Acute Rehabilitation Services Pager [phone] Office [phone]  Lynann Beaver 03/16/2018,3:32 PM

## 2018-03-16 NOTE — Progress Notes (Signed)
PROGRESS NOTE    Kelly Benjamin  NWG:[id number] DOB: Dec 07, 1940 DOA: 03/29/2018 PCP: Maurice Small, MD  Brief Narrative:  78 year old woman with history of CAD, ischemic cardiomyopathy, HFrEF (EF 40-45% in 2018 and now 15-20% in 2020), COPD, HTN admitted on 03/30/2018 with dyspnea on exertion and BLE edema found to have left upper lobe pneumonia, acute symptomatic anemia (hgb 5.6), NSTEMI, and new onset atrial fibrillation. Initially required BiPAP. She was given Rocephin and azithromycin but this was transitioned to vanc/cefepime. She was diuresed during the hospital course. She developed new onset left sided weakness and gaze deviation. CTA noted total occlusion of right proximal M1 segment without collaterals. Underwent right common carotid arteriogram and found to have occluded right ICA terminus, right MCA and right ACA. She underwent thrombectomy with IR 03/18/2018.   Significant Hospital Events   1/4> admit, working diagnosis of CAP, complicated by new onset atrial fibrillation, non-ST elevation MI and new heart failure 1/10> thrombectomy, transfer to ICU 1/11: Remains hemi-paretic on the left side, weaning.  MRI of the brain showed large MCA territory infarct involving basal ganglia and cortex, petechial hemorrhage throughout the infarct without parenchymal hemorrhage.  Mass-effect with partial effacement of the right lateral ventricle and 6 mm right-to-left midline shift. 03/16/18: Patient currently extubated.  On oxygen by nasal cannula.   Assessment & Plan:   Principal Problem:   PNA (pneumonia) Active Problems:   COPD (chronic obstructive pulmonary disease) (HCC)   SOB (shortness of breath)   Ischemic cardiomyopathy   Normocytic anemia   NSTEMI (non-ST elevated myocardial infarction) (HCC)   Abnormal CXR   Atrial fibrillation (HCC)   Acute combined systolic and diastolic heart failure (HCC)   Acute respiratory distress   Middle cerebral artery embolism, right   Acute Right MCA  CVA, Acute Right ICA Occlusion s/p IR Embolectomy 1/10:  Patient still intubated at this time.  MRI ordered by neurology. Aspirin, statins per neurology. 03/16/2018: MRI report showing right MCA a large infarct with diffuse petechial hemorrhage.  Per neurology DOAC in 7 days post stroke given the size of the infarct and hemorrhagic conversion.  Acute hypoxic respiratory failure, CAP 03/15/2018: CXR showing endotracheal tube in satisfactory position, bibasilar atelectasis, slight improvement in left upper lobe consolidation. -Patient remains intubated.  On cefepime for suspected pneumonia. 03/16/2018: Patient extubated.  On oxygen by nasal cannula.  On IV cefepime. -Being seen by CCM.  Acute kidney injury 03/15/2018: Improving with IV hydration.  Hold diuresis due to soft blood pressure. 03/16/2018: Improved.  Non-anion gap metabolic acidosis Continue to monitor.  COPD: Continue Brovana and Pulmicort, DuoNeb scheduled  Hypotension 03/15/2018: Exact etiology unclear. Holding ACE inhibitor and ARB  NSTEMI:Patient had troponin peaked to 1.35 on 1/5. 03/16/2018: Per cardiology absence of angina or ischemia on nuclear scintigraphy, recommend medical therapy. Continue aspirin statin.  Acute combined systolic and diastolic CHF:  Holding diuresis and Lopressor at this time given hypotension.  Cardiology following.  New onset atrial fibrillation Patient on pressors therefore no rate lowering treatment. Holding anticoagulation this time did have symptomatic anemia  Acute symptomatic anemia: Patient's baseline hemoglobin is around 11. She is status post 2 units PRBCs. Seen by GI in past. Considering EGD in the future On pantoprazole Continue to monitor, transfuse for hemoglobin less than 7  Transaminitis Monitor LFTs   DVT prophylaxis: SCD Code Status: Full Family Communication: Discussed with healthcare decision maker at bedside Disposition Plan: Unknown at this  time   Consultants:   Cardiology, Neurology  Procedures:  Acute Right ICA Occlusion s/p IR Embolectomy 1/10  Antimicrobials:   Cefepime    Subjective: Patient extubated. On oxygen by nasal cannula.  Blood pressure still low.  Objective: Vitals:   03/16/18 1000 03/16/18 1100 03/16/18 1200 03/16/18 1409  BP: (!) 76/41 (!) 95/54    Pulse: (!) 104 (!) 103    Resp:      Temp:   98.1 F (36.7 C)   TempSrc:   Oral   SpO2: 97% 99%  100%  Weight:      Height:        Intake/Output Summary (Last 24 hours) at 03/16/2018 1436 Last data filed at 03/16/2018 0800 Gross per 24 hour  Intake 1974.76 ml  Output 791 ml  Net 1183.76 ml   Filed Weights   03/25/2018 0300 03/15/18 0500 03/16/18 0300  Weight: 69.3 kg 69.3 kg 79.6 kg    Examination:  General exam: Opening eyes, minimally verbal, on oxygen by nasal cannula Respiratory system: Bilateral rhonchi Cardiovascular system: Irregularly irregular  Gastrointestinal system: Abdomen is nondistended, soft. No organomegaly or masses felt. Normal bowel sounds heard. Central nervous system: Opening eyes but not following commands at this time Extremities: Hemiparesis on right Skin: No rashes, lesions or ulcers   Data Reviewed: I have personally reviewed following labs and imaging studies  CBC: Recent Labs  Lab 03/24/2018 2129 03/13/18 0243 03/25/2018 0354 03/15/18 0602 03/16/18 0529  WBC 19.9* 14.8* 15.4* 17.0* 17.7*  NEUTROABS  --   --   --  14.2*  --   HGB 8.0* 8.0* 7.9* 9.8* 9.2*  HCT 26.1* 25.8* 26.2* 32.0* 30.3*  MCV 81.8 83.0 82.9 84.0 84.4  PLT 308 277 249 301 710   Basic Metabolic Panel: Recent Labs  Lab 03/11/18 0515 03/13/18 0243 03/07/2018 0354 03/15/18 0602 03/15/18 0920 03/16/18 0529  NA 135 128* 127*  --  133* 135  K 3.9 3.9 3.8  --  4.0 4.0  CL 103 99 98  --  105 110  CO2 18* 20* 21*  --  18* 15*  GLUCOSE 129* 122* 128*  --  101* 124*  BUN 41* 53* 48*  --  35* 30*  CREATININE 1.61* 1.57* 1.28*  --   0.88 0.97  CALCIUM 8.9 8.3* 8.0*  --  7.9* 8.3*  MG  --  2.2 2.3  --   --  2.2  PHOS  --   --   --  3.1  --   --    GFR: Estimated Creatinine Clearance: 53.4 mL/min (by C-G formula based on SCr of 0.97 mg/dL). Liver Function Tests: Recent Labs  Lab 03/29/2018 0400 03/12/18 0518  AST 214* 68*  ALT 172* 136*  ALKPHOS 79 435*  BILITOT 1.1 0.9  PROT 6.2* 5.3*  ALBUMIN 2.4* 2.0*   No results for input(s): LIPASE, AMYLASE in the last 168 hours. No results for input(s): AMMONIA in the last 168 hours. Coagulation Profile: Recent Labs  Lab 04/03/2018 2201  INR 1.35   Cardiac Enzymes: Recent Labs  Lab 03/09/18 1557  TROPONINI 1.30*   BNP (last 3 results) No results for input(s): PROBNP in the last 8760 hours. HbA1C: Recent Labs    03/15/18 0602  HGBA1C 6.1*   CBG: Recent Labs  Lab 03/09/18 1437 03/09/2018 2024  GLUCAP 158* 133*   Lipid Profile: Recent Labs    03/15/18 0602  CHOL 71  HDL 26*  LDLCALC 32  TRIG 64  CHOLHDL 2.7   Thyroid Function Tests: No results for  input(s): TSH, T4TOTAL, FREET4, T3FREE, THYROIDAB in the last 72 hours. Anemia Panel: No results for input(s): VITAMINB12, FOLATE, FERRITIN, TIBC, IRON, RETICCTPCT in the last 72 hours. Sepsis Labs: Recent Labs  Lab 03/07/2018 0400 04/03/2018 1906 03/28/2018 2129 03/11/18 0515 03/12/18 0518  PROCALCITON  --   --   --   --  1.14  LATICACIDVEN 2.2* 5.6* 2.7* 1.4  --     Recent Results (from the past 240 hour(s))  Culture, blood (routine x 2) Call MD if unable to obtain prior to antibiotics being given     Status: None   Collection Time: 03/26/2018  4:14 PM  Result Value Ref Range Status   Specimen Description BLOOD RIGHT ARM  Final   Special Requests   Final    BOTTLES DRAWN AEROBIC AND ANAEROBIC Blood Culture results may not be optimal due to an inadequate volume of blood received in culture bottles   Culture   Final    NO GROWTH 5 DAYS Performed at [facility], [place] [address],  [place], [place] [address]    Report Status 03/13/2018 FINAL  Final  Culture, blood (routine x 2) Call MD if unable to obtain prior to antibiotics being given     Status: None   Collection Time: 03/22/2018  4:21 PM  Result Value Ref Range Status   Specimen Description BLOOD RIGHT HAND  Final   Special Requests   Final    AEROBIC BOTTLE ONLY Blood Culture results may not be optimal due to an inadequate volume of blood received in culture bottles   Culture   Final    NO GROWTH 5 DAYS Performed at [facility], [place] [address], [place], [place] [address]    Report Status 03/13/2018 FINAL  Final  MRSA PCR Screening     Status: None   Collection Time: 03/12/2018 11:45 PM  Result Value Ref Range Status   MRSA by PCR NEGATIVE NEGATIVE Final    Comment:        The GeneXpert MRSA Assay (FDA approved for NASAL specimens only), is one component of a comprehensive MRSA colonization surveillance program. It is not intended to diagnose MRSA infection nor to guide or monitor treatment for MRSA infections. Performed at [facility], [place] [address] [place], [place] [address]   Culture, respiratory (non-expectorated)     Status: None (Preliminary result)   Collection Time: 03/15/18 11:48 AM  Result Value Ref Range Status   Specimen Description TRACHEAL ASPIRATE  Final   Special Requests NONE  Final   Gram Stain   Final    NO WBC SEEN NO ORGANISMS SEEN Performed at [facility], [place] [address], [place], [place] [address]    Culture CULTURE REINCUBATED FOR BETTER GROWTH  Final   Report Status PENDING  Incomplete         Radiology Studies: Ct Angio Head W Or Wo Contrast  Result Date: 03/31/2018 CLINICAL DATA:  Acute onset of left-sided weakness 2 hours ago. EXAM: CT ANGIOGRAPHY HEAD AND NECK TECHNIQUE: Multidetector CT imaging of the head and neck was performed using the standard protocol during bolus administration of intravenous contrast. Multiplanar CT image reconstructions  and MIPs were obtained to evaluate the vascular anatomy. Carotid stenosis measurements (when applicable) are obtained utilizing NASCET criteria, using the distal internal carotid diameter as the denominator. CONTRAST:  134m ISOVUE-370 IOPAMIDOL (ISOVUE-370) INJECTION 76% COMPARISON:  CT head without contrast 03/13/2018 FINDINGS: CTA NECK FINDINGS Aortic arch: Atherosclerotic calcifications  are present at the aortic arch. There is no aneurysm. No significant stenosis is present at the great vessel origins. Right carotid system: The right common carotid artery is tortuous with atherosclerotic changes throughout the course of the right common carotid artery. Calcifications extend through the bifurcation to the proximal right ICA without a significant stenosis relative to the more distal vessel. The cervical right ICA is within normal limits. Left carotid system: Minimal atherosclerotic calcifications are present within the left common carotid artery. Dense calcifications are present at the left carotid bifurcation. There is no significant stenosis relative to the more distal vessel. Atherosclerotic changes extend over 3 cm from the bifurcation. The cervical left ICA is otherwise normal. Vertebral arteries: The proximal left vertebral artery demonstrates a high-grade stenosis with extensive calcification. Opacification of the more distal left vertebral artery may reflect retrograde flow. No definite lumen is seen proximally. The non dominant right vertebral artery origin is within normal limits. There is no significant stenosis to the right vertebral artery in the neck. Atherosclerotic changes are present at C1-2 on the left without a significant stenosis. Skeleton: Multilevel degenerative changes are present in the cervical spine, particularly facet disease. This results in slight anterolisthesis at C4-5. No focal lytic or blastic lesions are present. Other neck: The soft tissues the neck demonstrate no focal mucosal  or submucosal lesions. Thyroid is within normal limits. No significant adenopathy is present. Salivary glands are normal. Upper chest: Increasing bilateral pleural effusions are present. Right upper lobe consolidated pneumonia is again seen. Mild edema is present. Review of the MIP images confirms the above findings CTA HEAD FINDINGS Anterior circulation: Atherosclerotic changes are present throughout the cavernous right internal carotid artery without a significant stenosis through the ICA terminus. Calcifications are present in the left cavernous internal carotid artery without a significant stenosis. A proximal right M1 segment occlusion is present. Right A1 segment is patent. Left A1 and M1 segments are within normal limits. The left MCA bifurcation is intact. Left MCA branch vessels are within normal limits. There is mild distal small vessel disease of the ACA vessels. No significant right MCA collateralization is present. Posterior circulation: The left vertebral artery is the dominant vessel. The hypoplastic right vertebral artery essentially terminates at the PICA sending only very small branch to the vertebrobasilar junction. There is mild narrowing of the distal left V4 segment. Basilar artery is small. Both posterior cerebral arteries originate from the basilar tip. A small left posterior communicating artery is present. PCA branch vessels demonstrate mild irregularity without a significant proximal stenosis or occlusion. Venous sinuses: The dural sinuses are patent. Straight sinus and deep cerebral veins are intact. Cortical veins are unremarkable. Anatomic variants: None Review of the MIP images confirms the above findings IMPRESSION: 1. Emergent large vessel occlusion involving the proximal right M1 segment. 2. Very poor collateral flow to the right MCA distribution. 3. 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 4. Opacification of the left vertebral artery above  the V1 segment may be retrograde flow. 5. Mild diffuse small vessel disease within the ACA branch vessels, left MCA branch vessels, and posterior circulation. 6. Extensive atherosclerotic calcification and vessel wall irregularity at the carotid bifurcations bilaterally without significant stenosis. 7. Extensive atherosclerotic changes within the cavernous internal carotid arteries bilaterally without significant stenosis. 8. Atherosclerotic changes at the aortic arch and particularly the left subclavian artery without significant stenosis. The above was relayed via text pager to Dr. Roland Rack on 03/17/2018 at 20:55. Electronically  Signed   By: San Morelle M.D.   On: 03/21/2018 21:17   Ct Angio Neck W Or Wo Contrast  Result Date: 03/15/2018 CLINICAL DATA:  Acute onset of left-sided weakness 2 hours ago. EXAM: CT ANGIOGRAPHY HEAD AND NECK TECHNIQUE: Multidetector CT imaging of the head and neck was performed using the standard protocol during bolus administration of intravenous contrast. Multiplanar CT image reconstructions and MIPs were obtained to evaluate the vascular anatomy. Carotid stenosis measurements (when applicable) are obtained utilizing NASCET criteria, using the distal internal carotid diameter as the denominator. CONTRAST:  125m ISOVUE-370 IOPAMIDOL (ISOVUE-370) INJECTION 76% COMPARISON:  CT head without contrast 03/11/2018 FINDINGS: CTA NECK FINDINGS Aortic arch: Atherosclerotic calcifications are present at the aortic arch. There is no aneurysm. No significant stenosis is present at the great vessel origins. Right carotid system: The right common carotid artery is tortuous with atherosclerotic changes throughout the course of the right common carotid artery. Calcifications extend through the bifurcation to the proximal right ICA without a significant stenosis relative to the more distal vessel. The cervical right ICA is within normal limits. Left carotid system: Minimal  atherosclerotic calcifications are present within the left common carotid artery. Dense calcifications are present at the left carotid bifurcation. There is no significant stenosis relative to the more distal vessel. Atherosclerotic changes extend over 3 cm from the bifurcation. The cervical left ICA is otherwise normal. Vertebral arteries: The proximal left vertebral artery demonstrates a high-grade stenosis with extensive calcification. Opacification of the more distal left vertebral artery may reflect retrograde flow. No definite lumen is seen proximally. The non dominant right vertebral artery origin is within normal limits. There is no significant stenosis to the right vertebral artery in the neck. Atherosclerotic changes are present at C1-2 on the left without a significant stenosis. Skeleton: Multilevel degenerative changes are present in the cervical spine, particularly facet disease. This results in slight anterolisthesis at C4-5. No focal lytic or blastic lesions are present. Other neck: The soft tissues the neck demonstrate no focal mucosal or submucosal lesions. Thyroid is within normal limits. No significant adenopathy is present. Salivary glands are normal. Upper chest: Increasing bilateral pleural effusions are present. Right upper lobe consolidated pneumonia is again seen. Mild edema is present. Review of the MIP images confirms the above findings CTA HEAD FINDINGS Anterior circulation: Atherosclerotic changes are present throughout the cavernous right internal carotid artery without a significant stenosis through the ICA terminus. Calcifications are present in the left cavernous internal carotid artery without a significant stenosis. A proximal right M1 segment occlusion is present. Right A1 segment is patent. Left A1 and M1 segments are within normal limits. The left MCA bifurcation is intact. Left MCA branch vessels are within normal limits. There is mild distal small vessel disease of the ACA  vessels. No significant right MCA collateralization is present. Posterior circulation: The left vertebral artery is the dominant vessel. The hypoplastic right vertebral artery essentially terminates at the PICA sending only very small branch to the vertebrobasilar junction. There is mild narrowing of the distal left V4 segment. Basilar artery is small. Both posterior cerebral arteries originate from the basilar tip. A small left posterior communicating artery is present. PCA branch vessels demonstrate mild irregularity without a significant proximal stenosis or occlusion. Venous sinuses: The dural sinuses are patent. Straight sinus and deep cerebral veins are intact. Cortical veins are unremarkable. Anatomic variants: None Review of the MIP images confirms the above findings IMPRESSION: 1. Emergent large vessel occlusion involving the proximal  right M1 segment. 2. Very poor collateral flow to the right MCA distribution. 3. 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 4. Opacification of the left vertebral artery above the V1 segment may be retrograde flow. 5. Mild diffuse small vessel disease within the ACA branch vessels, left MCA branch vessels, and posterior circulation. 6. Extensive atherosclerotic calcification and vessel wall irregularity at the carotid bifurcations bilaterally without significant stenosis. 7. Extensive atherosclerotic changes within the cavernous internal carotid arteries bilaterally without significant stenosis. 8. Atherosclerotic changes at the aortic arch and particularly the left subclavian artery without significant stenosis. The above was relayed via text pager to Dr. Roland Rack on 03/30/2018 at 20:55. Electronically Signed   By: San Morelle M.D.   On: 03/27/2018 21:17   Mr Jodene Nam Head Wo Contrast  Result Date: 03/15/2018 CLINICAL DATA:  Status post revascularization of right M1 occlusion. Right MCA infarct. EXAM: MRI HEAD WITHOUT  CONTRAST MRA HEAD WITHOUT CONTRAST TECHNIQUE: Multiplanar, multiecho pulse sequences of the brain and surrounding structures were obtained without intravenous contrast. Angiographic images of the head were obtained using MRA technique without contrast. COMPARISON:  CTA head and neck 03/14/2017. FINDINGS: MRI HEAD FINDINGS Brain: Diffusion-weighted images demonstrate a large right MCA territory infarct involving the right frontal and parietal lobe. There is involving the superior temporal lobe and insular cortex. Right lentiform nucleus and caudate are involved. T2 and FLAIR sequences demonstrate diffuse edema. There is petechial hemorrhage throughout the infarct territory. Petechial hemorrhage is noted in the right basal ganglia as well. No parenchymal hemorrhage is present. There is mass effect with edema in the cortex. There is partial effacement of the right lateral ventricle. Midline shift measures 6 mm at the foramen of Monro. No new infarct is present. Moderate diffuse white matter disease is present in the left hemisphere. Brainstem is within normal limits. Remote lacunar infarcts are again noted in the cerebellum bilaterally. The internal auditory canals are within normal limits. Vascular: Flow is present in the major intracranial arteries. Skull and upper cervical spine: Fluid in the nasopharynx is likely related to intubation. Exaggerated cervical lordosis is again noted. Sinuses/Orbits: Mild mucosal thickening is present throughout the ethmoid air cells. There is minimal mucosal thickening in the right maxillary sinus. Small mastoid effusions are present. Bilateral lens replacements are noted. Globes and orbits are otherwise unremarkable. MRA HEAD FINDINGS The internal carotid arteries are patent bilaterally. Mild atherosclerotic changes are noted in the cavernous segments. Flow is maintained within the right ICA, A1, and M1 segments. Left A1 and M1 segments are normal. ACA and MCA branch vessels are  normal bilaterally. The vertebrobasilar junction is below the field of view. Basilar artery is normal. AICA vessels are normal. Both posterior cerebral arteries originate from basilar tip. A left posterior communicating artery is present. PCA branch vessels are normal. IMPRESSION: 1. 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 2. Petechial hemorrhage throughout the infarct without parenchymal hemorrhage. 3. Mass effect with partial effacement of the right lateral ventricle and 6 mm of right-to-left midline shift. 4. Left cerebral hemisphere white matter disease consistent with underlying microvascular ischemia. 5. MRA demonstrates persistent patent flow within the right ICA and MCA. Electronically Signed   By: San Morelle M.D.   On: 03/15/2018 16:12   Mr Brain Wo Contrast  Result Date: 03/15/2018 CLINICAL DATA:  Status post revascularization of right M1 occlusion. Right MCA infarct. EXAM: MRI HEAD WITHOUT CONTRAST MRA HEAD WITHOUT CONTRAST TECHNIQUE: Multiplanar, multiecho pulse sequences  of the brain and surrounding structures were obtained without intravenous contrast. Angiographic images of the head were obtained using MRA technique without contrast. COMPARISON:  CTA head and neck 001/05/202019. FINDINGS: MRI HEAD FINDINGS Brain: Diffusion-weighted images demonstrate a large right MCA territory infarct involving the right frontal and parietal lobe. There is involving the superior temporal lobe and insular cortex. Right lentiform nucleus and caudate are involved. T2 and FLAIR sequences demonstrate diffuse edema. There is petechial hemorrhage throughout the infarct territory. Petechial hemorrhage is noted in the right basal ganglia as well. No parenchymal hemorrhage is present. There is mass effect with edema in the cortex. There is partial effacement of the right lateral ventricle. Midline shift measures 6 mm at the foramen of Monro. No new infarct is present. Moderate diffuse  white matter disease is present in the left hemisphere. Brainstem is within normal limits. Remote lacunar infarcts are again noted in the cerebellum bilaterally. The internal auditory canals are within normal limits. Vascular: Flow is present in the major intracranial arteries. Skull and upper cervical spine: Fluid in the nasopharynx is likely related to intubation. Exaggerated cervical lordosis is again noted. Sinuses/Orbits: Mild mucosal thickening is present throughout the ethmoid air cells. There is minimal mucosal thickening in the right maxillary sinus. Small mastoid effusions are present. Bilateral lens replacements are noted. Globes and orbits are otherwise unremarkable. MRA HEAD FINDINGS The internal carotid arteries are patent bilaterally. Mild atherosclerotic changes are noted in the cavernous segments. Flow is maintained within the right ICA, A1, and M1 segments. Left A1 and M1 segments are normal. ACA and MCA branch vessels are normal bilaterally. The vertebrobasilar junction is below the field of view. Basilar artery is normal. AICA vessels are normal. Both posterior cerebral arteries originate from basilar tip. A left posterior communicating artery is present. PCA branch vessels are normal. IMPRESSION: 1. 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 2. Petechial hemorrhage throughout the infarct without parenchymal hemorrhage. 3. Mass effect with partial effacement of the right lateral ventricle and 6 mm of right-to-left midline shift. 4. Left cerebral hemisphere white matter disease consistent with underlying microvascular ischemia. 5. MRA demonstrates persistent patent flow within the right ICA and MCA. Electronically Signed   By: San Morelle M.D.   On: 03/15/2018 16:12   Dg Chest Port 1 View  Result Date: 03/15/2018 CLINICAL DATA:  Follow-up CHF. EXAM: PORTABLE CHEST 1 VIEW COMPARISON:  03/13/2018 and older exams. FINDINGS: Masslike area of consolidation in the  left mid lung is stable. There are stable prominent bronchovascular and interstitial markings bilaterally. There is confluent opacity at the left lung base, unchanged. Small left and probable small right pleural effusions. No pneumothorax. Endotracheal tube tip projects 3.3 cm above the Carina. Nasal/orogastric tube passes below the diaphragm into the stomach. IMPRESSION: 1. Similar appearance to the previous day's study. 2. Masslike opacity in the left mid lung. Left lung base opacity most likely atelectasis although pneumonia is possible. Small left and probable small right pleural effusions. No overt pulmonary edema. There are chronically prominent bronchovascular and interstitial markings. Electronically Signed   By: Lajean Manes M.D.   On: 03/15/2018 19:17   Dg Chest Port 1 View  Result Date: 03/15/2018 CLINICAL DATA:  Intubation EXAM: PORTABLE CHEST 1 VIEW COMPARISON:  03/11/2018 FINDINGS: Endotracheal tube placed with tip measuring 3.3 cm above the carina. Enteric tube placed. Tip is off the field of view but below the left hemidiaphragm. Cardiac enlargement. No vascular congestion. Left perihilar mass or  consolidation with consolidation or atelectasis in the left lower lung as well. Similar appearance to previous study, lying for differences in technique. No pneumothorax. Calcification of the aorta. IMPRESSION: Appliances appear in satisfactory location. Cardiac enlargement. Left perihilar mass or consolidation with consolidation or atelectasis in the left lower lung, unchanged. Electronically Signed   By: Lucienne Capers M.D.   On: 03/15/2018 00:16   Ct Head Code Stroke Wo Contrast  Result Date: 03/07/2018 CLINICAL DATA:  Code stroke. Left-sided weakness and slurred speech. EXAM: CT HEAD WITHOUT CONTRAST TECHNIQUE: Contiguous axial images were obtained from the base of the skull through the vertex without intravenous contrast. COMPARISON:  None. FINDINGS: Brain: Mild atrophy and white matter  changes are within normal limits for age. No acute hemorrhage or mass lesion is present. There is decreased density of the right lentiform nucleus and insular ribbon. M1 and M2 gray-white differentiation is decreased. Super ganglionic cortex is within normal limits. Ventricles are of normal size. No significant extraaxial fluid collection is present. Remote lacunar infarcts are present in the right cerebellum. Brainstem is within normal limits. Vascular: A hyperdense right MCA is present. Atherosclerotic calcifications are present bilaterally. Skull: Calvarium is intact. No focal lytic or blastic lesions are present. Sinuses/Orbits: The paranasal sinuses and mastoid air cells are clear. Bilateral lens replacements are noted. Globes and orbits are otherwise unremarkable. ASPECTS [facility] Stroke Program Early CT Score) - Ganglionic level infarction (caudate, lentiform nuclei, internal capsule, insula, M1-M3 cortex): 3/7 - Supraganglionic infarction (M4-M6 cortex): 3/3 Total score (0-10 with 10 being normal): 6/10 IMPRESSION: 1. Acute right MCA territory infarct with decreased density in the right lentiform nucleus, right insular cortex, and right M1 and M2 cortex. 2. Hyperdense right MCA suggesting acute thrombus. 3. ASPECTS is 6/10 The above was relayed via text pager to Dr. Roland Rack on 03/26/2018 at 20:55 . Electronically Signed   By: San Morelle M.D.   On: 03/31/2018 20:57        Scheduled Meds: .  stroke: mapping our early stages of recovery book   Does not apply Once  . arformoterol  15 mcg Nebulization BID  . aspirin  325 mg Oral Daily  . atorvastatin  20 mg Oral QHS  . budesonide (PULMICORT) nebulizer solution  0.25 mg Nebulization BID  . chlorhexidine gluconate (MEDLINE KIT)  15 mL Mouth Rinse BID  . ezetimibe  10 mg Oral Daily  . ipratropium-albuterol  3 mL Nebulization TID  . mouth rinse  15 mL Mouth Rinse 10 times per day  . multivitamin  1 tablet Oral BID  .  pantoprazole (PROTONIX) IV  40 mg Intravenous Q24H   Continuous Infusions: . sodium chloride 50 mL/hr at 03/16/18 1111     LOS: 8 days     Yaakov Guthrie, MD [facility] Pager on amion  If 7PM-7AM, please contact night-coverage [email or web address] Password [facility] 03/16/2018, 2:36 PM

## 2018-03-16 NOTE — Progress Notes (Signed)
SLP Cancellation Note  Patient Details Name: CENIYAH THORP MRN: [id number] DOB: 11-19-1940   Cancelled treatment:       Reason Eval/Treat Not Completed: Medical issues which prohibited therapy(pt remains on vent)   Macario Golds 03/16/2018, 7:09 AM  Luanna Salk, MS [facility] SLP Acute Rehab Services Pager [phone] Office [phone]

## 2018-03-16 NOTE — Progress Notes (Signed)
NAME:  Kelly Benjamin, MRN:  [id number], DOB:  11/17/1940, LOS: 45 ADMISSION DATE:  03/17/2018, CONSULTATION DATE:  03/06/2018 REFERRING MD:  [facility], CHIEF COMPLAINT:  Vent management  History of present illness   78 year old woman with history of CAD, ischemic cardiomyopathy, HFrEF (EF 40-45% in 2018 and now 15-20% in 2020), COPD, HTN admitted on 04/04/2018 with dyspnea on exertion and BLE edema found to have left upper lobe pneumonia, acute symptomatic anemia (hgb 5.6), NSTEMI, and new onset atrial fibrillation. Initially required BiPAP. She was given Rocephin and azithromycin but this was transitioned to vanc/cefepime. She was diuresed during the hospital course. Tonight, she developed new onset left sided weakness and gaze deviation. CTA noted total occlusion of right proximal M1 segment without collaterals. Underwent right common carotid arteriogram and found to have occluded right ICA terminus, right MCA and right ACA. She underwent thrombectomy with IR 03/20/2018.   Past Medical History   CAD, ischemic cardiomyopathy, HFrEF (EF 40-45% in 2018), COPD, HTN, breast cancer, HLD  Significant Hospital Events   1/4> admit, working diagnosis of CAP, complicated by new onset atrial fibrillation, non-ST elevation MI and new heart failure 1/10> thrombectomy, transfer to ICU 1/11: Remains hemi-paretic on the left side, weaning.  MRI of brain: Showing a large MCA territory infarct involving the basal ganglia there are petechial hemorrhage throughout the infarct area without parenchymal hemorrhage.  There is partial effacement of the right lateral ventricle with 6 mm right to left shift.  Antibiotics completed.  Passed spontaneous breathing trial so extubated. Consults:  IR 1/10 Neuro 1/10 GI 1/5 Cards 1/4  Procedures:  1/10> IR thrombectomy  Significant Diagnostic Tests:  CTA head/neck w/ w/o 1/10> 1. Emergent large vessel occlusion involving the proximal right M1 segment. 2. Very  poor collateral flow to the right MCA distribution. 3. 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 4. Opacification of the left vertebral artery above the V1 segment may be retrograde flow. 5. Mild diffuse small vessel disease within the ACA branch vessels, left MCA branch vessels, and posterior circulation.  CT head w/o contrast 1/10> Acute right MCA territory infarct with decreased density in the right lentiform nucleus, right insular cortex, and right M1 and M2 cortex. Hyperdense right MCA suggesting acute thrombus.  CXR 1/7> Rounded opacity is again noted in the left mid lung   CT chest w/o contrast 1/4> Dense left upper lobe airspace disease. Small left pleural effusion noted. Mild interlobular septal thickening. Calcific coronary artery disease. Aortic atherosclerosis and emphysema  TTE 1/6>  - Left ventricle: The cavity size was normal. Wall thickness was increased in a pattern of mild LVH. Systolic function was severely reduced. The estimated ejection fraction was in the range of 15% to 20%. Diffuse hypokinesis. Features are consistent with a pseudonormal left ventricular filling pattern, with concomitant abnormal relaxation and increased filling pressure (grade 2 diastolic dysfunction). Doppler parameters are consistent with high ventricular filling pressure. - Aortic valve: Trileaflet; mildly thickened, mildly calcified   leaflets. - Mitral valve: There was moderate regurgitation. - Left atrium: The atrium was severely dilated. - Right ventricle: Systolic function was moderately reduced. - Right atrium: The atrium was severely dilated. - Tricuspid valve: There was moderate regurgitation. - Pulmonary arteries: Systolic pressure was mildly increased. PA   peak pressure: 41 mm Hg (S). MRI brain:MRI of brain: Showing a large MCA territory infarct involving the basal ganglia there are petechial hemorrhage throughout the infarct area without parenchymal  hemorrhage.  There  is partial effacement of the right lateral ventricle with 6 mm right to left shift Micro Data:  Blood Cx x 2 1/4 > NGTD Respiratory culture 1/11>>> Antimicrobials:  Azithromycin 500 mg IV 1/4 > 1/8 Cefepime 1/7 > 1/12 Ceftriaxone 1/4 > 1/6 Vancomycin 1/6 > 1/9  Interim history/subjective:  Looked excellent on spontaneous breathing trial ready for extubation  Objective   Blood pressure (Abnormal) 105/58, pulse (Abnormal) 120, temperature 98.1 F (36.7 C), temperature source Axillary, resp. rate 17, height 5\' 10"  (1.778 m), weight 79.6 kg, SpO2 100 %.    Vent Mode: PSV;CPAP FiO2 (%):  [40 %] 40 % Set Rate:  [14 bmp] 14 bmp Vt Set:  [550 mL] 550 mL PEEP:  [5 cmH20] 5 cmH20 Pressure Support:  [5 cmH20] 5 cmH20 Plateau Pressure:  [18 cmH20-22 cmH20] 21 cmH20   Intake/Output Summary (Last 24 hours) at 03/16/2018 0951 Last data filed at 03/16/2018 0800 Gross per 24 hour  Intake 2775.97 ml  Output 1091 ml  Net 1684.97 ml   Filed Weights   03/26/2018 0300 03/15/18 0500 03/16/18 0300  Weight: 69.3 kg 69.3 kg 79.6 kg    Examination: General: Awake, no distress, tolerating spontaneous breathing trial, HEENT normocephalic atraumatic orally intubated no JVD mucous membranes moist Pulmonary: Clear to auscultation excellent tidal volume on pressure support of 5 Cardiac: Atrial fibrillation with controlled ventricular rate Abdomen: Soft nontender no organomegaly Extremities: Warm dry brisk capillary refill Neuro: Awake, follows commands on the right, able to lift head off the bed.  Densely hemiparetic on the left GU: Clear yellow  Assessment & Plan:  78 year old woman with history of CAD, ischemic cardiomyopathy, HFrEF (EF 40-45% in 2018 and now 15-20% in 2020), COPD, HTN admitted on 03/12/2018 with left upper lobe pneumonia, acute symptomatic anemia (hgb 5.6), NSTEMI, and new onset atrial fibrillation. Hospital course complicated by development of new onset left sided  weakness and gaze deviation. Underwent right common carotid arteriogram and found to have occluded right ICA terminus, right MCA and right ACA. She underwent thrombectomy with IR 03/17/2018.   Acute Right MCA CVA, Acute Right ICA Occlusion s/p IR Embolectomy 1/10:  Plan Blood pressure goal 1 20-1 40 Aspirin statin Zetia per stroke team Follow-up MRI Serial neuro checks Rehab focus  Acute hypoxic respiratory failure, CAP with risk factors for pseudomonas: Portable chest x-ray from the 11th reviewed.  This showed mild improvement in aeration however likely was an element of edema superimposed on left side consolidation which has been slowly improving Sputum culture repeated and still pending Passed spontaneous breathing trial Antibiotics discontinued  plan Extubate Scheduled bronchodilators Discontinue sedation Aspiration precautions Wean oxygen for saturations greater than 92% Follow-up sputum   Acute kidney injury with progressive hyponatremia, this actually improved with IV hydration Plan Continuing gentle IV hydration Decrease IV fluid rate given history of heart failure Strict intake output A.m. chemistry  Non-anion gap metabolic acidosis, chloride rising Plan Decreasing saline A.m. chemistry  COPD: Plan Continuing Brovana and Pulmicort in lieu of Breo Scheduled DuoNeb  Hypotension, suspect this is drug-induced from sedation, currently resolved but she did have hypotension prior to her stroke as well which was limiting diuresis Plan We will continue to hold antihypertensives for now Holding diuretics as well Continue telemetry monitoring   NSTEMI: Patient had troponin peaked to 1.35 on 1/5. Plan Continuing aspirin and statin Continue Zetia  Deferring possible cardiac catheterization to cardiology team who will reevaluate this week   Acute combined systolic and diastolic CHF:  Plan  Keep euvolemic at this point  New onset atrial fibrillation Plan Rate  control  Telemetry  Holding anticoagulation due to symptomatic anemia during this stay   Acute symptomatic anemia: Patient's baseline hemoglobin is around 11. Seen by GI medicine last on the seventh. Considering EGD in the future Plan Holding anticoagulation  Pantoprazole continuing pantoprazole Transfuse for hemoglobin less than 7 Will need to decide on anticoagulation at some point, may need to consider EGD first?  Transaminitis, this is improving. Plan A.m. LFTs  Best practice:  Diet: NPO Pain/Anxiety/Delirium protocol (if indicated): fent gtt and prn for goal RASS 0 VAP protocol (if indicated): Ordered DVT prophylaxis: SCDs GI prophylaxis: PPI Glucose control: Monitor Mobility: PT/OT Code Status: Full Family Communication: None at bedside Disposition: ICU remains critically ill.  Requires intensive care therapy for weaning of mechanical ventilation, titration of vasoactive drips, and close  observation, interpretation and management of metabolic derangements   Critical care time: 35 minutes   Erick Colace ACNP-BC [place] Pager # [phone] OR # [phone] if no answer

## 2018-03-16 NOTE — Evaluation (Signed)
Occupational Therapy Evaluation Patient Details Name: Kelly Benjamin MRN: [id number] DOB: 1941/02/18 Today's Date: 03/16/2018    History of Present Illness 78 y.o. female admitted on 04/04/2018 for SOB.  Pt initally dx with PNA, NSTEMI vs demand ischemia vs ACS.  Pt with symptomatic anemia (GI consulted ro r/o bleed).  Pt converted to A-fib with RVR on and off of HFNC and BiPAP.  Pt's course complicated when code stroke was called on 03/29/2018 due to sudden onset L sided weakness.  Pt underwent revascularization of R ICA. tPA was not given due to suspected GIB.  Pt intubated for procedure and extubated 02/14/19.  Pt with other significant PMH of HTN, COPD, CAD, L Breast CA, knee surgery.     Clinical Impression   PTA patient independent and driving.  Admitted for above and limited by L inattention, L hemiparesis, decreased activity tolerance, and impaired balance. Presents with R gaze, able to answer questions with head nods consistently but limited verbalizations.  Completes grooming with max assist, UB ADLs with max assist, LB ADLs with total assist, transfers deferred at this time but able to sit EOB with mod to max assist +2 for safety. Continues to have no AROM of L UE, but demonstrating increased AROM of L LE while seated EOB. Patient will benefit from continued OT services while admitted and after dc at CIR level in order to optimize return to independence and PLOF.  Will continue to follow.   BP: supine 93/43, EOB 79/60, EOB after 3 min 100/60    Follow Up Recommendations  CIR    Equipment Recommendations  Other (comment)(TBD at next venue of care)    Recommendations for Other Services Rehab consult     Precautions / Restrictions Precautions Precautions: Fall Precaution Comments: left sided weakness, R gaze Restrictions Weight Bearing Restrictions: No      Mobility Bed Mobility Overal bed mobility: Needs Assistance Bed Mobility: Supine to Sit;Sit to Supine     Supine to sit:  Max assist;HOB elevated;+2 for physical assistance Sit to supine: Total assist;HOB elevated;+2 for physical assistance   General bed mobility comments: patient able to initate movement with B LEs towards EOB given increased time and multimodal cueing but total assist for trunk support; fatigue and total to return to supine   Transfers                 General transfer comment: NT, deferred due to safety and fatigue     Balance Overall balance assessment: Needs assistance Sitting-balance support: Feet supported;Single extremity supported Sitting balance-Leahy Scale: Poor Sitting balance - Comments: sitting EOB with L lateral lean, mod assist with patient pushing with R UE at times; transitioned onto R elbow and able to sustain sitting with min-mod assist at best; fatigue increases posterior lean   Postural control: Left lateral lean;Posterior lean                                 ADL either performed or assessed with clinical judgement   ADL Overall ADL's : Needs assistance/impaired     Grooming: Moderate assistance;Wash/dry hands;Wash/dry face;Sitting   Upper Body Bathing: Sitting;Maximal assistance   Lower Body Bathing: Total assistance;+2 for safety/equipment;+2 for physical assistance;Bed level   Upper Body Dressing : Maximal assistance;Sitting   Lower Body Dressing: Total assistance;+2 for physical assistance;+2 for safety/equipment;Bed level     Toilet Transfer Details (indicate cue type and reason): deferred due to safety  and fatigue          Functional mobility during ADLs: Maximal assistance;+2 for physical assistance;+2 for safety/equipment;Cueing for sequencing;Cueing for safety General ADL Comments: overall requires max to total assist +2 for self care, limited by L inattention, L hemiparesis, and impaired balance      Vision Baseline Vision/History: Wears glasses Wears Glasses: At all times Patient Visual Report: No change from  baseline Vision Assessment?: Vision impaired- to be further tested in functional context Additional Comments: unable to complete formal assessment, but presents with R gaze and unable to get pt to gaze past midline      Perception Perception Comments: L inattention    Praxis      Pertinent Vitals/Pain Pain Assessment: No/denies pain     Hand Dominance Right   Extremity/Trunk Assessment Upper Extremity Assessment Upper Extremity Assessment: LUE deficits/detail;Generalized weakness LUE Deficits / Details: flaccid, edema, no active or functional use  LUE Sensation: decreased light touch;decreased proprioception LUE Coordination: decreased fine motor;decreased gross motor   Lower Extremity Assessment Lower Extremity Assessment: Defer to PT evaluation   Cervical / Trunk Assessment Cervical / Trunk Assessment: Other exceptions Cervical / Trunk Exceptions: forward head, rounded shoulders   Communication Communication Communication: Expressive difficulties(minimal verbalizations, nods head yes/no )   Cognition Arousal/Alertness: Awake/alert Behavior During Therapy: WFL for tasks assessed/performed Overall Cognitive Status: Impaired/Different from baseline Area of Impairment: Memory;Attention;Following commands;Awareness;Problem solving                   Current Attention Level: Sustained Memory: Decreased short-term memory Following Commands: Follows one step commands consistently;Follows one step commands with increased time   Awareness: Emergent Problem Solving: Slow processing;Decreased initiation;Requires verbal cues;Difficulty sequencing;Requires tactile cues General Comments: patient oriented (able to nod yes/no) answering correctly, following commands given increased time    General Comments  BP improved with activity to EOB, see clincial impression     Exercises     Shoulder Instructions      Home Living Family/patient expects to be discharged to:: Private  residence Living Arrangements: Non-relatives/Friends Available Help at Discharge: Family;Friend(s) Type of Home: House Home Access: Stairs to enter [employer] of Steps: 4 Entrance Stairs-Rails: (+ rail) Home Layout: Two level Alternate Level Stairs-Number of Steps: flight   Bathroom Shower/Tub: Occupational psychologist: Standard     Home Equipment: None   Additional Comments: provided by family/friends      Prior Functioning/Environment Level of Independence: Independent        Comments: indepedent, driving         OT Problem List: Decreased strength;Decreased range of motion;Decreased activity tolerance;Impaired balance (sitting and/or standing);Impaired vision/perception;Decreased coordination;Decreased cognition;Decreased safety awareness;Decreased knowledge of use of DME or AE;Decreased knowledge of precautions;Impaired sensation;Impaired tone;Impaired UE functional use;Increased edema      OT Treatment/Interventions: Self-care/ADL training;Neuromuscular education;DME and/or AE instruction;Therapeutic activities;Cognitive remediation/compensation;Visual/perceptual remediation/compensation;Patient/family education;Balance training    OT Goals(Current goals can be found in the care plan section) Acute Rehab OT Goals Patient Stated Goal: none stated Time For Goal Achievement: 03/30/18 Potential to Achieve Goals: Good  OT Frequency: Min 3X/week   Barriers to D/C:            Co-evaluation              AM-PAC OT "6 Clicks" Daily Activity     Outcome Measure Help from another person eating meals?: Total Help from another person taking care of personal grooming?: A Lot Help from another person toileting, which includes using  toliet, bedpan, or urinal?: Total Help from another person bathing (including washing, rinsing, drying)?: Total Help from another person to put on and taking off regular upper body clothing?: Total Help from another  person to put on and taking off regular lower body clothing?: Total 6 Click Score: 7   End of Session Equipment Utilized During Treatment: Oxygen Nurse Communication: Mobility status  Activity Tolerance: Patient tolerated treatment well Patient left: in bed;with call bell/phone within reach;with bed alarm set;with family/visitor present  OT Visit Diagnosis: Other abnormalities of gait and mobility (R26.89);Muscle weakness (generalized) (M62.81);Hemiplegia and hemiparesis Hemiplegia - Right/Left: Left Hemiplegia - dominant/non-dominant: Non-Dominant Hemiplegia - caused by: Cerebral infarction                Time: 9480-1655 OT Time Calculation (min): 30 min Charges:  OT General Charges $OT Visit: 1 Visit OT Evaluation $OT Eval High Complexity: 1 High OT Treatments $Self Care/Home Management : 8-22 mins  Delight Stare, OT Acute Rehabilitation Services Pager [phone] Office [phone]    Delight Stare 03/16/2018, 2:38 PM

## 2018-03-17 ENCOUNTER — Encounter (HOSPITAL_COMMUNITY): Payer: Self-pay | Admitting: Interventional Radiology

## 2018-03-17 ENCOUNTER — Inpatient Hospital Stay (HOSPITAL_COMMUNITY): Payer: Medicare PPO

## 2018-03-17 DIAGNOSIS — J9601 Acute respiratory failure with hypoxia: Secondary | ICD-10-CM | POA: Insufficient documentation

## 2018-03-17 DIAGNOSIS — I10 Essential (primary) hypertension: Secondary | ICD-10-CM

## 2018-03-17 DIAGNOSIS — R1312 Dysphagia, oropharyngeal phase: Secondary | ICD-10-CM

## 2018-03-17 DIAGNOSIS — D72829 Elevated white blood cell count, unspecified: Secondary | ICD-10-CM

## 2018-03-17 DIAGNOSIS — I6601 Occlusion and stenosis of right middle cerebral artery: Secondary | ICD-10-CM

## 2018-03-17 DIAGNOSIS — I509 Heart failure, unspecified: Secondary | ICD-10-CM

## 2018-03-17 DIAGNOSIS — I5043 Acute on chronic combined systolic (congestive) and diastolic (congestive) heart failure: Secondary | ICD-10-CM

## 2018-03-17 DIAGNOSIS — D649 Anemia, unspecified: Secondary | ICD-10-CM

## 2018-03-17 DIAGNOSIS — I248 Other forms of acute ischemic heart disease: Secondary | ICD-10-CM

## 2018-03-17 DIAGNOSIS — I480 Paroxysmal atrial fibrillation: Secondary | ICD-10-CM

## 2018-03-17 LAB — CULTURE, RESPIRATORY: CULTURE: NORMAL

## 2018-03-17 LAB — CBC
HCT: 35.4 % — ABNORMAL LOW (ref 36.0–46.0)
Hemoglobin: 10.8 g/dL — ABNORMAL LOW (ref 12.0–15.0)
MCH: 26.3 pg (ref 26.0–34.0)
MCHC: 30.5 g/dL (ref 30.0–36.0)
MCV: 86.3 fL (ref 80.0–100.0)
Platelets: 268 10*3/uL (ref 150–400)
RBC: 4.1 MIL/uL (ref 3.87–5.11)
RDW: 19.9 % — ABNORMAL HIGH (ref 11.5–15.5)
WBC: 22 10*3/uL — ABNORMAL HIGH (ref 4.0–10.5)
nRBC: 0 % (ref 0.0–0.2)

## 2018-03-17 LAB — BASIC METABOLIC PANEL
Anion gap: 13 (ref 5–15)
BUN: 27 mg/dL — ABNORMAL HIGH (ref 8–23)
CO2: 16 mmol/L — ABNORMAL LOW (ref 22–32)
Calcium: 9.1 mg/dL (ref 8.9–10.3)
Chloride: 109 mmol/L (ref 98–111)
Creatinine, Ser: 0.95 mg/dL (ref 0.44–1.00)
GFR calc Af Amer: >60 mL/min (ref >60)
GFR calc non Af Amer: 58 mL/min — ABNORMAL LOW (ref >60)
Glucose, Bld: 124 mg/dL — ABNORMAL HIGH (ref 70–99)
POTASSIUM: 4.4 mmol/L (ref 3.5–5.1)
Sodium: 138 mmol/L (ref 135–145)

## 2018-03-17 LAB — CULTURE, RESPIRATORY W GRAM STAIN: Gram Stain: NONE SEEN

## 2018-03-17 LAB — MAGNESIUM: MAGNESIUM: 2.4 mg/dL (ref 1.7–2.4)

## 2018-03-17 LAB — GLUCOSE, CAPILLARY
Glucose-Capillary: 119 mg/dL — ABNORMAL HIGH (ref 70–99)
Glucose-Capillary: 163 mg/dL — ABNORMAL HIGH (ref 70–99)

## 2018-03-17 MED ORDER — PANTOPRAZOLE SODIUM 40 MG IV SOLR
40.0000 mg | INTRAVENOUS | Status: DC
  Administered 2018-03-17 – 2018-03-22 (×6): 40 mg via INTRAVENOUS
  Filled 2018-03-17 (×5): qty 40

## 2018-03-17 MED ORDER — METOPROLOL TARTRATE 5 MG/5ML IV SOLN
INTRAVENOUS | Status: AC
  Filled 2018-03-17: qty 5

## 2018-03-17 MED ORDER — SODIUM CHLORIDE 3 % IN NEBU
4.0000 mL | INHALATION_SOLUTION | Freq: Three times a day (TID) | RESPIRATORY_TRACT | Status: DC
  Administered 2018-03-17 – 2018-03-21 (×12): 4 mL via RESPIRATORY_TRACT
  Filled 2018-03-17 (×11): qty 4

## 2018-03-17 MED ORDER — SODIUM CHLORIDE 3 % IN NEBU
4.0000 mL | INHALATION_SOLUTION | Freq: Four times a day (QID) | RESPIRATORY_TRACT | Status: DC
  Filled 2018-03-17: qty 4

## 2018-03-17 MED ORDER — JEVITY 1.2 CAL PO LIQD
1000.0000 mL | ORAL | Status: DC
  Administered 2018-03-17 – 2018-03-20 (×4): 1000 mL
  Filled 2018-03-17 (×5): qty 1000

## 2018-03-17 MED ORDER — PANTOPRAZOLE SODIUM 40 MG PO TBEC: 40.0000 mg | DELAYED_RELEASE_TABLET | Freq: Every day | ORAL | Status: DC

## 2018-03-17 MED ORDER — METOPROLOL TARTRATE 5 MG/5ML IV SOLN
5.0000 mg | Freq: Four times a day (QID) | INTRAVENOUS | Status: DC | PRN
  Administered 2018-03-17: 5 mg via INTRAVENOUS
  Filled 2018-03-17: qty 5

## 2018-03-17 MED ORDER — PRO-STAT SUGAR FREE PO LIQD: 30.0000 mL | Freq: Two times a day (BID) | ORAL | Status: DC

## 2018-03-17 MED ORDER — PRO-STAT SUGAR FREE PO LIQD
30.0000 mL | Freq: Every day | ORAL | Status: DC
  Administered 2018-03-18 – 2018-03-20 (×2): 30 mL
  Filled 2018-03-17 (×2): qty 30

## 2018-03-17 MED ORDER — VITAL HIGH PROTEIN PO LIQD
1000.0000 mL | ORAL | Status: DC
  Filled 2018-03-17: qty 1000

## 2018-03-17 MED ORDER — SODIUM CHLORIDE 3 % IN NEBU: 4.0000 mL | INHALATION_SOLUTION | Freq: Four times a day (QID) | RESPIRATORY_TRACT | Status: DC

## 2018-03-17 MED ORDER — IPRATROPIUM-ALBUTEROL 0.5-2.5 (3) MG/3ML IN SOLN
3.0000 mL | Freq: Three times a day (TID) | RESPIRATORY_TRACT | Status: DC
  Administered 2018-03-17 – 2018-03-21 (×12): 3 mL via RESPIRATORY_TRACT
  Filled 2018-03-17 (×12): qty 3

## 2018-03-17 NOTE — Progress Notes (Signed)
Nutrition Follow-up  DOCUMENTATION CODES:   Not applicable  INTERVENTION:  Jevity 1.2 @ 39mL/hr via Cortrax 96mL Prostat daily MVI  Provides 1684 kcals 88 grams protein, 1125mL free water daily   NUTRITION DIAGNOSIS:   Inadequate oral intake related to inability to eat as evidenced by NPO status. Remains appropriate   GOAL:   Patient will meet greater than or equal to 90% of their needs Progressing  MONITOR:   Vent status, Labs, Skin, I & O's  REASON FOR ASSESSMENT:   Ventilator    ASSESSMENT:   78 yo female with PMH of COPD, HTN, breast cancer s/p XRT, CAD, HLD, ischemic cardiomyopathy who was admitted on 1/4 with symptomatic anemia, PNA, NSTEMI, and new onset A fib. She developed L sided weakness and gaze deviation; found to have acute R MCA infarct. S/P thrombectomy 1/10, then transferred to the ICU.   Spoke with family who reports patient decreased appetite just prior to admission and gradual wt loss within the past 6 months.   1/12 Extubated 1/13 Cortrax 10 Fr. Stomach 1/13 SLP:  Patient needs continued Speech Lanaguage Pathology Services Diet recommendations: NPO  Medications reviewed and include:  Non pertinent at this time I/O: +7234.6 ml since admit UOP: 643ml x 24 hrs    Diet Order:   Diet Order            Diet NPO time specified  Diet effective now              EDUCATION NEEDS:   No education needs have been identified at this time  Skin:  Skin Assessment: Reviewed RN Assessment  Last BM:  03/09/2018: type 5  Height:   Ht Readings from Last 1 Encounters:  03/05/2018 5\' 10"  (1.778 m)    Weight:   Wt Readings from Last 1 Encounters:  03/17/18 80 kg   03/16/18 79.6kg 03/15/18 69.3kg  Ideal Body Weight:  68.2 kg  BMI:  Body mass index is 25.31 kg/m.  Estimated Nutritional Needs:   Kcal:  8299-3716  Protein:  82-102g  Fluid:  1.5-1.6 L    Lajuan Lines, RD, LDN  After Hours/Weekend Pager: [phone]

## 2018-03-17 NOTE — Consult Note (Signed)
Physical Medicine and Rehabilitation Consult Reason for Consult: Decreased functional mobility Referring Physician: Dr.Xu   HPI: Kelly Benjamin is a 78 y.o.right handed female with history of CAD, ischemic cardiomyopathy with ejection fraction of 40% maintained on aspirin and Plavix, iron deficiency anemia, COPD, hypertension. Per chart review, patient independent driving prior to admission living with a roommate. Presented 03/22/2018 with increasing shortness of breath and some increased swelling lower extremities 2 weeks. Findings of hemoglobin 5.6, troponin 0.73, lactic acid within normal limits, BNP elevated 3356. Chest CT showed dense left upper lobe airspace disease presumably pneumonia. Mild interlobular septal thickening suggestive of interstitial edema. No lytic or sclerotic lesion. Her anticoagulation was held due to anemia. Cardiology follow-up suspect non-STEMI and no current plan for cardiac catheterization. Patient new onset atrial fibrillation with follow-up echocardiogram ejection fraction of 20% diffuse hypokinesis grade 2 diastolic dysfunction. Patient did require intubation for a short time. She did require 2 units pack red blood cells for anemia with follow-up gastroenterology services awaiting plan for possible EGD in the future and currently maintained on Protonix. Patient develop increased left-sided weakness and slurred speech with cranial CT scan 03/29/2018 reviewed, showing right MCA infarct.  CT angiogram of head and neck emergent large vessel occlusion involving the proximal right M1 segment. Follow-up interventional radiology status post TIC13 reperfusion. Await plan for DOAC at day 7 post stroke. Therapy evaluations completed with recommendations of physical medicine rehabilitation consult.   Review of Systems  Unable to perform ROS: Severe respiratory distress   Past Medical History:  Diagnosis Date  . Breast cancer ([place]) 09/2017   left  . CAD in native  artery 02/28/2016   2V CAD -medical rx  . COPD (chronic obstructive pulmonary disease) ([place])   . Dyslipidemia, goal LDL below 70   . Hypertension   . Ischemic cardiomyopathy    EF improved to 40-45% on Entresto  . Personal history of radiation therapy    Past Surgical History:  Procedure Laterality Date  . APPENDECTOMY    . BREAST LUMPECTOMY  09/2017  . BREAST SURGERY  2000  . CARDIAC CATHETERIZATION N/A 03/02/2016   Procedure: Left Heart Cath and Coronary Angiography;  Surgeon: Belva Crome, MD;  Location: [facility];  Service: Cardiovascular;  Laterality: N/A;  . CATARACT EXTRACTION    . KNEE SURGERY     Family History  Problem Relation Age of Onset  . Heart attack Father    Social History:  reports that she has quit smoking. She has quit using smokeless tobacco. She reports current alcohol use. She reports that she does not use drugs. Allergies:  Allergies  Allergen Reactions  . Sulfa Antibiotics Swelling   Medications Prior to Admission  Medication Sig Dispense Refill  . aspirin EC 81 MG tablet Take 1 tablet (81 mg total) by mouth daily. 30 tablet 2  . atorvastatin (LIPITOR) 40 MG tablet TAKE 1 TABLET BY MOUTH EVERY DAY (Patient taking differently: Take 40 mg by mouth at bedtime. ) 90 tablet 2  . clopidogrel (PLAVIX) 75 MG tablet TAKE 1 TABLET BY MOUTH EVERY DAY (Patient taking differently: Take 75 mg by mouth daily. ) 30 tablet 4  . ENTRESTO 97-103 MG TAKE 1 TABLET BY MOUTH 2 (TWO) TIMES DAILY. (Patient taking differently: Take 1 tablet by mouth 2 (two) times daily. ) 180 tablet 1  . ezetimibe (ZETIA) 10 MG tablet TAKE 1 TABLET EVERY DAY (Patient taking differently: Take 10 mg by mouth daily. )  90 tablet 3  . fluticasone furoate-vilanterol (BREO ELLIPTA) 200-25 MCG/INH AEPB Inhale 1 puff into the lungs daily. 1 each 0  . furosemide (LASIX) 40 MG tablet TAKE 1 TABLET BY MOUTH EVERY DAY (Patient taking differently: Take 40 mg by mouth daily. ) 30 tablet 4  .  isosorbide mononitrate (IMDUR) 30 MG 24 hr tablet TAKE 1 TABLET BY MOUTH EVERY DAY (Patient taking differently: Take 30 mg by mouth daily. ) 30 tablet 4  . metoprolol succinate (TOPROL-XL) 25 MG 24 hr tablet TAKE 1 TABLET BY MOUTH EVERY DAY (Patient taking differently: Take 25 mg by mouth daily. ) 30 tablet 4  . Multiple Vitamins-Minerals (ICAPS AREDS 2) CAPS Take 1 capsule by mouth 2 (two) times daily.    Marland Kitchen SPIRIVA HANDIHALER 18 MCG inhalation capsule INHALE CONTENTS OF 1 CAPSULE EVERY DAY (Patient taking differently: Place 18 mcg into inhaler and inhale. ) 30 capsule 3    Home: Home Living Family/patient expects to be discharged to:: Private residence Living Arrangements: Non-relatives/Friends Available Help at Discharge: Family, Friend(s) Type of Home: House Home Access: Stairs to enter Technical brewer of Steps: 4 Entrance Stairs-Rails: (+ rail) Home Layout: Two level Alternate Level Stairs-Number of Steps: flight Bathroom Shower/Tub: Multimedia programmer: Standard Home Equipment: None Additional Comments: provided by family/friends  Functional History: Prior Function Level of Independence: Independent Comments: indepedent, driving  Functional Status:  Mobility: Bed Mobility Overal bed mobility: Needs Assistance Bed Mobility: Supine to Sit, Sit to Supine Supine to sit: Max assist, HOB elevated, +2 for physical assistance Sit to supine: Total assist, HOB elevated, +2 for physical assistance General bed mobility comments: patient able to initate movement with B LEs towards EOB given increased time and multimodal cueing but total assist for trunk support; fatigue and total to return to supine  Transfers General transfer comment: NT, deferred due to safety and fatigue       ADL: ADL Overall ADL's : Needs assistance/impaired Grooming: Moderate assistance, Wash/dry hands, Wash/dry face, Sitting Upper Body Bathing: Sitting, Maximal assistance Lower Body  Bathing: Total assistance, +2 for safety/equipment, +2 for physical assistance, Bed level Upper Body Dressing : Maximal assistance, Sitting Lower Body Dressing: Total assistance, +2 for physical assistance, +2 for safety/equipment, Bed level Toilet Transfer Details (indicate cue type and reason): deferred due to safety and fatigue  Functional mobility during ADLs: Maximal assistance, +2 for physical assistance, +2 for safety/equipment, Cueing for sequencing, Cueing for safety General ADL Comments: overall requires max to total assist +2 for self care, limited by L inattention, L hemiparesis, and impaired balance   Cognition: Cognition Overall Cognitive Status: Impaired/Different from baseline Orientation Level: Disoriented to time, Disoriented to situation Cognition Arousal/Alertness: Awake/alert Behavior During Therapy: WFL for tasks assessed/performed Overall Cognitive Status: Impaired/Different from baseline Area of Impairment: Memory, Attention, Following commands, Awareness, Problem solving Current Attention Level: Sustained Memory: Decreased short-term memory Following Commands: Follows one step commands consistently, Follows one step commands with increased time Awareness: Emergent Problem Solving: Slow processing, Decreased initiation, Requires verbal cues, Difficulty sequencing, Requires tactile cues General Comments: patient oriented (able to nod yes/no) answering correctly, following commands given increased time   Blood pressure (!) 99/54, pulse (!) 105, temperature (!) 97.5 F (36.4 C), temperature source Axillary, resp. rate 20, height 5\' 10"  (1.778 m), weight 80 kg, SpO2 98 %. Physical Exam  Vitals reviewed. Constitutional: She appears well-developed and well-nourished.  HENT:  Head: Normocephalic and atraumatic.  Eyes: EOM are normal. Right eye exhibits no discharge. Left  eye exhibits no discharge.  Pupils reactive to light  Neck: Normal range of motion. Neck supple.  No thyromegaly present.  Cardiovascular:  Irregularly irregular  Respiratory: She is in respiratory distress.  Severe SOB  GI: Soft. Bowel sounds are normal. She exhibits no distension.  Musculoskeletal:     Comments: Generalized edema  Neurological: She is alert.  Speech is very dysarthric but intelligible. Motor exam limited due to shortness of breath, however left side appears to be 0/5, right lower extremity: 2/5, right upper extremity: 2+/5  Skin: Skin is warm and dry.  Psychiatric:  Unable to assess due to respiratory distress    No results found for this or any previous visit (from the past 24 hour(s)). [name] ZO Contrast  Result Date: 03/15/2018 CLINICAL DATA:  Status post revascularization of right M1 occlusion. Right MCA infarct. EXAM: MRI HEAD WITHOUT CONTRAST MRA HEAD WITHOUT CONTRAST TECHNIQUE: Multiplanar, multiecho pulse sequences of the brain and surrounding structures were obtained without intravenous contrast. Angiographic images of the head were obtained using MRA technique without contrast. COMPARISON:  CTA head and neck 03/14/2017. FINDINGS: MRI HEAD FINDINGS Brain: Diffusion-weighted images demonstrate a large right MCA territory infarct involving the right frontal and parietal lobe. There is involving the superior temporal lobe and insular cortex. Right lentiform nucleus and caudate are involved. T2 and FLAIR sequences demonstrate diffuse edema. There is petechial hemorrhage throughout the infarct territory. Petechial hemorrhage is noted in the right basal ganglia as well. No parenchymal hemorrhage is present. There is mass effect with edema in the cortex. There is partial effacement of the right lateral ventricle. Midline shift measures 6 mm at the foramen of Monro. No new infarct is present. Moderate diffuse white matter disease is present in the left hemisphere. Brainstem is within normal limits. Remote lacunar infarcts are again noted in the cerebellum bilaterally.  The internal auditory canals are within normal limits. Vascular: Flow is present in the major intracranial arteries. Skull and upper cervical spine: Fluid in the nasopharynx is likely related to intubation. Exaggerated cervical lordosis is again noted. Sinuses/Orbits: Mild mucosal thickening is present throughout the ethmoid air cells. There is minimal mucosal thickening in the right maxillary sinus. Small mastoid effusions are present. Bilateral lens replacements are noted. Globes and orbits are otherwise unremarkable. MRA HEAD FINDINGS The internal carotid arteries are patent bilaterally. Mild atherosclerotic changes are noted in the cavernous segments. Flow is maintained within the right ICA, A1, and M1 segments. Left A1 and M1 segments are normal. ACA and MCA branch vessels are normal bilaterally. The vertebrobasilar junction is below the field of view. Basilar artery is normal. AICA vessels are normal. Both posterior cerebral arteries originate from basilar tip. A left posterior communicating artery is present. PCA branch vessels are normal. IMPRESSION: 1. 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 2. Petechial hemorrhage throughout the infarct without parenchymal hemorrhage. 3. Mass effect with partial effacement of the right lateral ventricle and 6 mm of right-to-left midline shift. 4. Left cerebral hemisphere white matter disease consistent with underlying microvascular ischemia. 5. MRA demonstrates persistent patent flow within the right ICA and MCA. Electronically Signed   By: San Morelle M.D.   On: 03/15/2018 16:12   Mr Brain Wo Contrast  Result Date: 03/15/2018 CLINICAL DATA:  Status post revascularization of right M1 occlusion. Right MCA infarct. EXAM: MRI HEAD WITHOUT CONTRAST MRA HEAD WITHOUT CONTRAST TECHNIQUE: Multiplanar, multiecho pulse sequences of the brain and surrounding structures were obtained without intravenous  contrast. Angiographic images of the head  were obtained using MRA technique without contrast. COMPARISON:  CTA head and neck 001/17/202019. FINDINGS: MRI HEAD FINDINGS Brain: Diffusion-weighted images demonstrate a large right MCA territory infarct involving the right frontal and parietal lobe. There is involving the superior temporal lobe and insular cortex. Right lentiform nucleus and caudate are involved. T2 and FLAIR sequences demonstrate diffuse edema. There is petechial hemorrhage throughout the infarct territory. Petechial hemorrhage is noted in the right basal ganglia as well. No parenchymal hemorrhage is present. There is mass effect with edema in the cortex. There is partial effacement of the right lateral ventricle. Midline shift measures 6 mm at the foramen of Monro. No new infarct is present. Moderate diffuse white matter disease is present in the left hemisphere. Brainstem is within normal limits. Remote lacunar infarcts are again noted in the cerebellum bilaterally. The internal auditory canals are within normal limits. Vascular: Flow is present in the major intracranial arteries. Skull and upper cervical spine: Fluid in the nasopharynx is likely related to intubation. Exaggerated cervical lordosis is again noted. Sinuses/Orbits: Mild mucosal thickening is present throughout the ethmoid air cells. There is minimal mucosal thickening in the right maxillary sinus. Small mastoid effusions are present. Bilateral lens replacements are noted. Globes and orbits are otherwise unremarkable. MRA HEAD FINDINGS The internal carotid arteries are patent bilaterally. Mild atherosclerotic changes are noted in the cavernous segments. Flow is maintained within the right ICA, A1, and M1 segments. Left A1 and M1 segments are normal. ACA and MCA branch vessels are normal bilaterally. The vertebrobasilar junction is below the field of view. Basilar artery is normal. AICA vessels are normal. Both posterior cerebral arteries originate from basilar tip. A left posterior  communicating artery is present. PCA branch vessels are normal. IMPRESSION: 1. 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 2. Petechial hemorrhage throughout the infarct without parenchymal hemorrhage. 3. Mass effect with partial effacement of the right lateral ventricle and 6 mm of right-to-left midline shift. 4. Left cerebral hemisphere white matter disease consistent with underlying microvascular ischemia. 5. MRA demonstrates persistent patent flow within the right ICA and MCA. Electronically Signed   By: San Morelle M.D.   On: 03/15/2018 16:12   Dg Chest Port 1 View  Result Date: 03/15/2018 CLINICAL DATA:  Follow-up CHF. EXAM: PORTABLE CHEST 1 VIEW COMPARISON:  03/14/2018 and older exams. FINDINGS: Masslike area of consolidation in the left mid lung is stable. There are stable prominent bronchovascular and interstitial markings bilaterally. There is confluent opacity at the left lung base, unchanged. Small left and probable small right pleural effusions. No pneumothorax. Endotracheal tube tip projects 3.3 cm above the Carina. Nasal/orogastric tube passes below the diaphragm into the stomach. IMPRESSION: 1. Similar appearance to the previous day's study. 2. Masslike opacity in the left mid lung. Left lung base opacity most likely atelectasis although pneumonia is possible. Small left and probable small right pleural effusions. No overt pulmonary edema. There are chronically prominent bronchovascular and interstitial markings. Electronically Signed   By: Lajean Manes M.D.   On: 03/15/2018 19:17    Assessment/Plan: Diagnosis: Right MCA infarct Labs and images (see above) independently reviewed.  Records reviewed and summated above. Stroke: Continue secondary stroke prophylaxis and Risk Factor Modification listed below:   Antiplatelet therapy:   Blood Pressure Management:  Continue current medication with prn's with permisive HTN per primary team Statin Agent:     Prediabetes management:   Tobacco abuse:   Left sided  hemiparesis: fit for orthosis to prevent contractures (resting hand splint for day, wrist cock up splint at night, PRAFO, etc) Motor recovery: Fluoxetine  1. Does the need for close, 24 hr/day medical supervision in concert with the patient's rehab needs make it unreasonable for this patient to be served in a less intensive setting? Yes  2. Co-Morbidities requiring supervision/potential complications: atrial fibrillation (continue meds, monitor heart rate with increased physical activity), combined CHF (Monitor in accordance with increased physical activity and avoid UE resistance excercises), CAD, iron deficiency anemia (repeat labs, transfuse to ensure appropriate perfusion for increased activity tolerance), COPD, HTN (monitor and provide prns in accordance with increased physical exertion and pain), leukocytosis (repeat labs, cont to monitor for signs and symptoms of infection, further workup if indicated) 3. Due to bladder management, bowel management, safety, skin/wound care, disease management, medication administration, pain management and patient education, does the patient require 24 hr/day rehab nursing? Yes 4. Does the patient require coordinated care of a physician, rehab nurse, PT (1-2 hrs/day, 5 days/week), OT (1-2 hrs/day, 5 days/week) and SLP (1-2 hrs/day, 5 days/week) to address physical and functional deficits in the context of the above medical diagnosis(es)? Yes Addressing deficits in the following areas: balance, endurance, locomotion, strength, transferring, bowel/bladder control, bathing, dressing, feeding, grooming, toileting, speech, swallowing and psychosocial support 5. Can the patient actively participate in an intensive therapy program of at least 3 hrs of therapy per day at least 5 days per week? No 6. The potential for patient to make measurable gains while on inpatient rehab is excellent 7. Anticipated functional  outcomes upon discharge from inpatient rehab are mod assist  with PT, mod assist with OT, supervision and min assist with SLP. 8. Estimated rehab length of stay to reach the above functional goals is: 26-30 days. 9. Anticipated D/C setting: Other 10. Anticipated post D/C treatments: SNF 11. Overall Rehab/Functional Prognosis: good  RECOMMENDATIONS: This patient's condition is appropriate for continued rehabilitative care in the following setting: Currently with respiratory distress and unable to tolerate 3 hours of therapy per day.  Given lack of tolerance and discharge disposition recommend SNF at this time.  Will continue to follow along his medical issues began to stabilize. Patient has agreed to participate in recommended program. Potentially Note that insurance prior authorization may be required for reimbursement for recommended care.  Comment: Rehab Admissions Coordinator to follow up.   I have personally performed a face to face diagnostic evaluation, including, but not limited to relevant history and physical exam findings, of this patient and developed relevant assessment and plan.  Additionally, I have reviewed and concur with the physician assistant's documentation above.   Delice Lesch, MD, ABPMR Lavon Paganini Angiulli, PA-C 03/17/2018

## 2018-03-17 NOTE — Progress Notes (Signed)
STROKE TEAM PROGRESS NOTE   SUBJECTIVE (INTERVAL HISTORY) Her RN is at bedside.  She still has A. fib RVR, did not pass swallow, currently n.p.o.  Has secretions not able to cough out easily, suctioned out large amount of dry mucous.  Patient seems has mild respiratory distress, will start nebulization and chest PT.  Will put core track for tube feeding and medication p.o.   OBJECTIVE Vitals:   03/17/18 0816 03/17/18 0817 03/17/18 0900 03/17/18 1000  BP:   106/79 108/76  Pulse:   (!) 132 (!) 110  Resp:      Temp:      TempSrc:      SpO2: 99% 99% 96% 95%  Weight:      Height:        CBC:  Recent Labs  Lab 03/15/18 0602 03/16/18 0529 03/17/18 0749  WBC 17.0* 17.7* 22.0*  NEUTROABS 14.2*  --   --   HGB 9.8* 9.2* 10.8*  HCT 32.0* 30.3* 35.4*  MCV 84.0 84.4 86.3  PLT 301 266 573    Basic Metabolic Panel:  Recent Labs  Lab 03/15/18 0602  03/16/18 0529 03/17/18 0749  NA  --    < > 135 138  K  --    < > 4.0 4.4  CL  --    < > 110 109  CO2  --    < > 15* 16*  GLUCOSE  --    < > 124* 124*  BUN  --    < > 30* 27*  CREATININE  --    < > 0.97 0.95  CALCIUM  --    < > 8.3* 9.1  MG  --   --  2.2 2.4  PHOS 3.1  --   --   --    < > = values in this interval not displayed.    Lipid Panel:     Component Value Date/Time   CHOL 71 03/15/2018 0602   CHOL 112 12/07/2016 0812   TRIG 64 03/15/2018 0602   HDL 26 (L) 03/15/2018 0602   HDL 32 (L) 12/07/2016 0812   CHOLHDL 2.7 03/15/2018 0602   VLDL 13 03/15/2018 0602   LDLCALC 32 03/15/2018 0602   LDLCALC 59 12/07/2016 0812   HgbA1c:  Lab Results  Component Value Date   HGBA1C 6.1 (H) 03/15/2018   Urine Drug Screen: No results found for: LABOPIA, COCAINSCRNUR, LABBENZ, AMPHETMU, THCU, LABBARB  Alcohol Level No results found for: ETH  IMAGING  Ct Angio Head W Or Wo Contrast Ct Angio Neck W Or Wo Contrast 03/07/2018 IMPRESSION:  1. Emergent large vessel occlusion involving the proximal right M1 segment.  2. Very poor  collateral flow to the right MCA distribution.  3. 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  4. Opacification of the left vertebral artery above the V1 segment may be retrograde flow.  5. Mild diffuse small vessel disease within the ACA branch vessels, left MCA branch vessels, and posterior circulation.  6. Extensive atherosclerotic calcification and vessel wall irregularity at the carotid bifurcations bilaterally without significant stenosis.  7. Extensive atherosclerotic changes within the cavernous internal carotid arteries bilaterally without significant stenosis.  8. Atherosclerotic changes at the aortic arch and particularly the left subclavian artery without significant stenosis.    Dg Chest Port 1 View 03/15/2018 IMPRESSION:  Appliances appear in satisfactory location. Cardiac enlargement. Left perihilar mass or consolidation with consolidation or atelectasis in the left lower lung, unchanged.  Ct Head Code Stroke Wo Contrast 03/26/2018 IMPRESSION:  1. Acute right MCA territory infarct with decreased density in the right lentiform nucleus, right insular cortex, and right M1 and M2 cortex.  2. Hyperdense right MCA suggesting acute thrombus.  3. ASPECTS is 6/10    Transthoracic Echocardiogram  03/10/2017 Study Conclusions - Left ventricle: The cavity size was normal. Wall thickness was   increased in a pattern of mild LVH. Systolic function was   severely reduced. The estimated ejection fraction was in the   range of 15% to 20%. Diffuse hypokinesis. Features are consistent   with a pseudonormal left ventricular filling pattern, with   concomitant abnormal relaxation and increased filling pressure   (grade 2 diastolic dysfunction). Doppler parameters are   consistent with high ventricular filling pressure. - Aortic valve: Trileaflet; mildly thickened, mildly calcified   leaflets. - Mitral valve: There was moderate regurgitation. - Left  atrium: The atrium was severely dilated. - Right ventricle: Systolic function was moderately reduced. - Right atrium: The atrium was severely dilated. - Tricuspid valve: There was moderate regurgitation. - Pulmonary arteries: Systolic pressure was mildly increased. PA   peak pressure: 41 mm Hg (S). Impressions: - EF has reduced from prior study (40-45%)   Mr Kelly Benjamin Wo Contrast  Result Date: 03/15/2018 CLINICAL DATA:  Status post revascularization of right M1 occlusion. Right MCA infarct. EXAM: MRI HEAD WITHOUT CONTRAST MRA HEAD WITHOUT CONTRAST TECHNIQUE: Multiplanar, multiecho pulse sequences of the brain and surrounding structures were obtained without intravenous contrast. Angiographic images of the head were obtained using MRA technique without contrast. COMPARISON:  CTA head and neck 03/14/2017. FINDINGS: MRI HEAD FINDINGS Brain: Diffusion-weighted images demonstrate a large right MCA territory infarct involving the right frontal and parietal lobe. There is involving the superior temporal lobe and insular cortex. Right lentiform nucleus and caudate are involved. T2 and FLAIR sequences demonstrate diffuse edema. There is petechial hemorrhage throughout the infarct territory. Petechial hemorrhage is noted in the right basal ganglia as well. No parenchymal hemorrhage is present. There is mass effect with edema in the cortex. There is partial effacement of the right lateral ventricle. Midline shift measures 6 mm at the foramen of Monro. No new infarct is present. Moderate diffuse white matter disease is present in the left hemisphere. Brainstem is within normal limits. Remote lacunar infarcts are again noted in the cerebellum bilaterally. The internal auditory canals are within normal limits. Vascular: Flow is present in the major intracranial arteries. Skull and upper cervical spine: Fluid in the nasopharynx is likely related to intubation. Exaggerated cervical lordosis is again noted.  Sinuses/Orbits: Mild mucosal thickening is present throughout the ethmoid air cells. There is minimal mucosal thickening in the right maxillary sinus. Small mastoid effusions are present. Bilateral lens replacements are noted. Globes and orbits are otherwise unremarkable. MRA HEAD FINDINGS The internal carotid arteries are patent bilaterally. Mild atherosclerotic changes are noted in the cavernous segments. Flow is maintained within the right ICA, A1, and M1 segments. Left A1 and M1 segments are normal. ACA and MCA branch vessels are normal bilaterally. The vertebrobasilar junction is below the field of view. Basilar artery is normal. AICA vessels are normal. Both posterior cerebral arteries originate from basilar tip. A left posterior communicating artery is present. PCA branch vessels are normal. IMPRESSION: 1. 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 2. Petechial hemorrhage throughout the infarct without parenchymal hemorrhage. 3. Mass effect with partial effacement of the right lateral ventricle and 6 mm  of right-to-left midline shift. 4. Left cerebral hemisphere white matter disease consistent with underlying microvascular ischemia. 5. MRA demonstrates persistent patent flow within the right ICA and MCA. Electronically Signed   By: San Morelle M.D.   On: 03/15/2018 16:12   Mr Brain Wo Contrast  Result Date: 03/15/2018 CLINICAL DATA:  Status post revascularization of right M1 occlusion. Right MCA infarct. EXAM: MRI HEAD WITHOUT CONTRAST MRA HEAD WITHOUT CONTRAST TECHNIQUE: Multiplanar, multiecho pulse sequences of the brain and surrounding structures were obtained without intravenous contrast. Angiographic images of the head were obtained using MRA technique without contrast. COMPARISON:  CTA head and neck 03/14/2017. FINDINGS: MRI HEAD FINDINGS Brain: Diffusion-weighted images demonstrate a large right MCA territory infarct involving the right frontal and parietal lobe.  There is involving the superior temporal lobe and insular cortex. Right lentiform nucleus and caudate are involved. T2 and FLAIR sequences demonstrate diffuse edema. There is petechial hemorrhage throughout the infarct territory. Petechial hemorrhage is noted in the right basal ganglia as well. No parenchymal hemorrhage is present. There is mass effect with edema in the cortex. There is partial effacement of the right lateral ventricle. Midline shift measures 6 mm at the foramen of Monro. No new infarct is present. Moderate diffuse white matter disease is present in the left hemisphere. Brainstem is within normal limits. Remote lacunar infarcts are again noted in the cerebellum bilaterally. The internal auditory canals are within normal limits. Vascular: Flow is present in the major intracranial arteries. Skull and upper cervical spine: Fluid in the nasopharynx is likely related to intubation. Exaggerated cervical lordosis is again noted. Sinuses/Orbits: Mild mucosal thickening is present throughout the ethmoid air cells. There is minimal mucosal thickening in the right maxillary sinus. Small mastoid effusions are present. Bilateral lens replacements are noted. Globes and orbits are otherwise unremarkable. MRA HEAD FINDINGS The internal carotid arteries are patent bilaterally. Mild atherosclerotic changes are noted in the cavernous segments. Flow is maintained within the right ICA, A1, and M1 segments. Left A1 and M1 segments are normal. ACA and MCA branch vessels are normal bilaterally. The vertebrobasilar junction is below the field of view. Basilar artery is normal. AICA vessels are normal. Both posterior cerebral arteries originate from basilar tip. A left posterior communicating artery is present. PCA branch vessels are normal. IMPRESSION: 1. 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 2. Petechial hemorrhage throughout the infarct without parenchymal hemorrhage. 3. Mass effect with  partial effacement of the right lateral ventricle and 6 mm of right-to-left midline shift. 4. Left cerebral hemisphere white matter disease consistent with underlying microvascular ischemia. 5. MRA demonstrates persistent patent flow within the right ICA and MCA. Electronically Signed   By: San Morelle M.D.   On: 03/15/2018 16:12     PHYSICAL EXAM  Temp:  [97.5 F (36.4 C)-98.2 F (36.8 C)] 97.5 F (36.4 C) (01/13 0400) Pulse Rate:  [47-132] 110 (01/13 1000) Resp:  [20] 20 (01/12 2003) BP: (85-121)/(43-83) 108/76 (01/13 1000) SpO2:  [95 %-100 %] 95 % (01/13 1000) Weight:  [80 kg] 80 kg (01/13 0200)  General - Well nourished, well developed, in mild respiratory distress.  Ophthalmologic - fundi not visualized due to noncooperation.  Cardiovascular - irregularly irregular heart rate and rhythm, telemetry showed A. fib RVR.  Neuro - mild respiratory distress, awake alert, orientated to self, age, place and time.  Able to following all simple commands. Right gaze preference but barely cross midline, PERRL, blinking to visual threat on the right  but not on the left. Mild left facial asymmetry. Tongue midline. RUE 4/5 and RLE 3/5, but left LE 2/5 proximally on pain but able to wiggle toes on command on the left, left upper extremity 0/5. DTR 1+ and left babinski positive. Sensation symmetrical, coordination not cooperative and gait not tested.   ASSESSMENT/PLAN Ms. Kelly Benjamin is a 78 y.o. female with history of coronary artery disease, ischemic cardiomyopathy with EF in the past to 40%, iron deficiency anemia as a child  presenting with CHF, elevated troponin, dysarthria and Lt sided weakness.  She did not receive IV t-PA due to recent GI bleeding and severe anemia and late presentation.  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 fib and cardiomyopathy with low EF  CT head - Acute right MCA territory  infarct with decreased density in the right lentiform nucleus, right insular cortex, and right M1 and M2 cortex. Hyperdense right MCA suggesting acute thrombus.   MRI head right MCA large infarct with diffuse petechial hemorrhage  MRA head right MCA patent with luxury perfusion  CTA H&N - proximal right M1 LVO. High-grade stenosis, possibly occlusion of the dominant left vertebral artery.   2D Echo  - EF 15-20% decrease from prior 40 to 45%. No cardiac source of emboli identified.   LDL - 32  HgbA1c - 6.1  VTE prophylaxis - SCDs  Diet - NPO  aspirin 81 mg daily and clopidogrel 75 mg daily prior to admission, now on aspirin 325 mg daily. Will consider DOAC in 7 days post stroke given size of infarct and hemorrhagic conversion  Patient counseled to be compliant with her antithrombotic medications  Ongoing aggressive stroke risk factor management  Therapy recommendations:  pending  Disposition:  Pending  Cerebral edema  MRI showed large right MCA infarct but spare most BG and CR.   MLS about 38mm  MRA showed right MCA luxury reperfusion  Clinically stable at this time  No need 3% at this time  Sodium 135-138  New onset Afib  In the setting of pneumonia, anemia and non-STEMI  Currently A. fib RVR  On IV metoprolol 5 mg as needed  Will start metoprolol p.o. once p.o. access  We will consider DOAC 7 days post stroke given the size of infarct  Cardiomyopathy with low EF  2D echo showed EF 15 to 20% down from prior 40 to 45%  CCM on board  Avoid fluid overload  CXR Masslike opacity in the left mid lung. No overt pulmonary edema.   Non-STEMI  Cardiology on board  On aspirin  We will consider DOAC at day 7 post stroke  Respiratory failure  Intubated for procedure  CCM on board  Extubated, so far tolerating, however still has mild raspy distress  Copious secretions  3% saline nebulization  Chest PT  NT suctioning as  needed  Hypotension History of hypertension  BP runs low but improved  Off Levophed  . BP goal 110-140 post procedure . continue NS @ 50 . Diuresis if needed  Hyperlipidemia  Lipid lowering medication PTA:  Lipitor 40 mg daily and Zetia 10 mg daily  LDL 32, goal < 70  Current lipid lowering medication: Lipitor 20 mg daily and Zetia 10 mg daily  Continue statin at discharge  Recent pneumonia  CXR showed Masslike opacity in the left mid lung. Unchanged consolidation or atelectasis in the left lower lung  Leukocytosis  17.0->17.7-> 22.0  Off cefepime  CCM on board  Fever Tmax 100.8-> afebrile  Hyponatremia  Sodium 127-133->135-> 138  Continue monitoring  Continue NS @ 50  Severe anemia, improving  Hemoglobin 5.6-PRBC-9.8->9.2-> 10.8  GI on board plan for endoscopy once patient stable  CBC monitoring  Dysphagia  Did not pass swallow  Speech following  Will need core track placement for tube feeding and p.o. meds  Other Stroke Risk Factors  Advanced age  Former cigarette smoker - quit  ETOH use, advised to drink no more than 1 alcoholic beverage per day.  Coronary artery disease  Other Active Problems  Elevated creatinine 1.57-1.28-0.88->0.97-> 0.95   Hospital day # 9  This patient is critically ill due to right MCA stroke, A. fib RVR, cardiomyopathy with low EF, pneumonia, non-STEMI, anemia need blood transfusion, new onset A. fib and at significant risk of neurological worsening, death form recurrent stroke, hemorrhagic conversion, seizure, heart failure, cardiogenic shock. This patient's care requires constant monitoring of vital signs, hemodynamics, respiratory and cardiac monitoring, review of multiple databases, neurological assessment, discussion with family, other specialists and medical decision making of high complexity. I spent 40 minutes of neurocritical care time in the care of this patient.  I discussed with Dr. Vaughan Browner in  [place].  Rosalin Hawking, MD PhD Stroke Neurology 03/17/2018 11:06 AM     To contact Stroke Continuity provider, please refer to [email or web address] After hours, contact General Neurology

## 2018-03-17 NOTE — Procedures (Signed)
Cortrak  Person Inserting Tube:  Idelia Salm, RD Tube Type:  Cortrak - 43 inches Tube Location:  Right nare Initial Placement:  Stomach Secured by: Bridle Technique Used to Measure Tube Placement:  Documented cm marking at nare/ corner of mouth Cortrak Secured At:  71 cm    Cortrak Tube Team Note:  Consult received to place a Cortrak feeding tube.   No x-ray is required. RN may begin using tube.   If the tube becomes dislodged please keep the tube and contact the Cortrak team at [email or web address] ([email or web address]) for replacement.  If after hours and replacement cannot be delayed, place a NG tube and confirm placement with an abdominal x-ray.    Gaynell Face, MS, RD, LDN Inpatient Clinical Dietitian Pager: [phone] Weekend/After Hours: [phone]

## 2018-03-17 NOTE — Progress Notes (Signed)
NAME:  Kelly Benjamin, MRN:  [id number], DOB:  1940-05-27, LOS: 63 ADMISSION DATE:  03/18/2018, CONSULTATION DATE:  03/11/2018 REFERRING MD:  [facility], CHIEF COMPLAINT:  Vent management  History of present illness   78 year old woman with history of CAD, ischemic cardiomyopathy, HFrEF (EF 40-45% in 2018 and now 15-20% in 2020), COPD, HTN admitted on 03/25/2018 with dyspnea on exertion and BLE edema found to have left upper lobe pneumonia, acute symptomatic anemia (hgb 5.6), NSTEMI, and new onset atrial fibrillation. Initially required BiPAP. She was given Rocephin and azithromycin but this was transitioned to vanc/cefepime. She was diuresed during the hospital course. Tonight, she developed new onset left sided weakness and gaze deviation. CTA noted total occlusion of right proximal M1 segment without collaterals. Underwent right common carotid arteriogram and found to have occluded right ICA terminus, right MCA and right ACA. She underwent thrombectomy with IR 03/19/2018.   Past Medical History   CAD, ischemic cardiomyopathy, HFrEF (EF 40-45% in 2018), COPD, HTN, breast cancer, HLD  Significant Hospital Events   1/4> admit, working diagnosis of CAP, complicated by new onset atrial fibrillation, non-ST elevation MI and new heart failure 1/10> thrombectomy, transfer to ICU 1/11: Remains hemi-paretic on the left side, weaning.  MRI of brain: Showing a large MCA territory infarct involving the basal ganglia there are petechial hemorrhage throughout the infarct area without parenchymal hemorrhage.  There is partial effacement of the right lateral ventricle with 6 mm right to left shift.  Antibiotics completed.  Passed spontaneous breathing trial so extubated. Consults:  IR 1/10 Neuro 1/10 GI 1/5 Cards 1/4  Procedures:  1/10> IR thrombectomy  Significant Diagnostic Tests:  CTA head/neck w/ w/o 1/10> 1. Emergent large vessel occlusion involving the proximal right M1 segment. 2. Very  poor collateral flow to the right MCA distribution. 3. 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 4. Opacification of the left vertebral artery above the V1 segment may be retrograde flow. 5. Mild diffuse small vessel disease within the ACA branch vessels, left MCA branch vessels, and posterior circulation.  CT head w/o contrast 1/10> Acute right MCA territory infarct with decreased density in the right lentiform nucleus, right insular cortex, and right M1 and M2 cortex. Hyperdense right MCA suggesting acute thrombus.  CXR 1/7> Rounded opacity is again noted in the left mid lung   CT chest w/o contrast 1/4> Dense left upper lobe airspace disease. Small left pleural effusion noted. Mild interlobular septal thickening. Calcific coronary artery disease. Aortic atherosclerosis and emphysema  TTE 1/6>  - Left ventricle: The cavity size was normal. Wall thickness was increased in a pattern of mild LVH. Systolic function was severely reduced. The estimated ejection fraction was in the range of 15% to 20%. Diffuse hypokinesis. Features are consistent with a pseudonormal left ventricular filling pattern, with concomitant abnormal relaxation and increased filling pressure (grade 2 diastolic dysfunction). Doppler parameters are consistent with high ventricular filling pressure. - Aortic valve: Trileaflet; mildly thickened, mildly calcified   leaflets. - Mitral valve: There was moderate regurgitation. - Left atrium: The atrium was severely dilated. - Right ventricle: Systolic function was moderately reduced. - Right atrium: The atrium was severely dilated. - Tricuspid valve: There was moderate regurgitation. - Pulmonary arteries: Systolic pressure was mildly increased. PA   peak pressure: 41 mm Hg (S). MRI brain:MRI of brain: Showing a large MCA territory infarct involving the basal ganglia there are petechial hemorrhage throughout the infarct area without parenchymal  hemorrhage.  There  is partial effacement of the right lateral ventricle with 6 mm right to left shift  CXR 1/12> peristant L mid-lung density, mass-like in appearance. Small bilateral effusions.  - personally reviewed-  Micro Data:  Blood Cx x 2 1/4 > NGTD Respiratory culture 1/11> no orgs seen Antimicrobials:  Azithromycin 500 mg IV 1/4 > 1/8 Cefepime 1/7 > 1/12 Ceftriaxone 1/4 > 1/6 Vancomycin 1/6 > 1/9  Interim history/subjective:  No acute events overnight Extubated yesterday and continues to protect airway. Failed swallow eval.  Objective   Blood pressure 110/71, pulse (!) 104, temperature (!) 97.5 F (36.4 C), temperature source Axillary, resp. rate 20, height 5\' 10"  (1.778 m), weight 80 kg, SpO2 99 %.        Intake/Output Summary (Last 24 hours) at 03/17/2018 0915 Last data filed at 03/17/2018 0800 Gross per 24 hour  Intake 1190.54 ml  Output 525 ml  Net 665.54 ml   Filed Weights   03/15/18 0500 03/16/18 0300 03/17/18 0200  Weight: 69.3 kg 79.6 kg 80 kg    Examination: General: Elderly, frail appearing female. NAD HEENT: [place]/AT. Moist mucus membranes. Sclera anicteric.  Pulmonary: Bilateral course crackles. Symmetrical lung expansion. Trachea midline  Abdomen: soft, round, non-tender, non-distended. Bowel sounds x4 quadrants Extremities: Warm dry brisk capillary refill Neuro: Awake, alert. Does not follow commands LUE. Follows commands RUE BLE.   Weak LLE GU: clear, yellow urine   Assessment & Plan:    Acute R MCA CVA, acute R ICA occlusion s/p embolectomy Plan SBP goal 120-140 ASA Zetia statin per CVA team  Follow up MRI per CVA discretion Rehab focus NPO- failed swallow study 1/12    Acute hypoxic respiratory failure CAP with risk factors for pseudomonas: resolved At risk aspiration Extubated and protecting airway Sputum cx neg to date L lung mass persistent  plan Scheduled bronchodilators Continue aspiration precautions NPO due to failed swallow  eval\ Will need follow up evaluation of L lung mass-like opacity in 6-8 weeks  Pulm toilet   Acute kidney injury, resolved  Plan Judicious IVF given CHF Strict I/O Follow BUN/Cr lytes   COPD: Plan [place], Pulmicort, Duoneb Goal SpO2 88-92%  Hypotension: resolved  Plan Telemetry Holding home antiHTN agents   NSTEMI: Patient had troponin peaked to 1.35 on 1/5. 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 Echo 1/6: LVEF 15-20% Plan Judicious IVF administration Cardiology following  Atrial Fibrillation Plan Continuous telemetry  Holding systemic anticoagulation, possible DOAC per neurology in future  Acute anemia Plan  Continue protonix Trend H/H Transfuse PRN for goal Hgb > 7 Possible EGD when stable to evaluate possible sources of bleeding  Malnutrition, at risk  NPO, failed swallow study 1/12 P Recommend enteral access and RDN consult for initiation of enteral nutrition  Best practice:  Diet: NPO Pain/Anxiety/Delirium protocol (if indicated): APAP VAP protocol (if indicated):  DVT prophylaxis: SCDs GI prophylaxis: PPI Glucose control: Monitor Mobility: PT/OT Code Status: Full Family Communication: No family at bedside Disposition: ICU    Critical care time: 55 min     Eliseo Gum MSN, AGACNP-BC [place] 03/17/2018, 9:15 AM

## 2018-03-17 NOTE — Progress Notes (Addendum)
Progress Note  Patient Name: Kelly Benjamin Date of Encounter: 03/17/2018  Primary Cardiologist: Quay Burow, MD   Subjective   Patient very fatigued after PT today. Unable to answer questions.   Inpatient Medications    Scheduled Meds: .  stroke: mapping our early stages of recovery book   Does not apply Once  . arformoterol  15 mcg Nebulization BID  . aspirin  325 mg Oral Daily  . atorvastatin  20 mg Oral QHS  . budesonide (PULMICORT) nebulizer solution  0.25 mg Nebulization BID  . chlorhexidine gluconate (MEDLINE KIT)  15 mL Mouth Rinse BID  . ezetimibe  10 mg Oral Daily  . feeding supplement (PRO-STAT SUGAR FREE 64)  30 mL Per Tube BID  . feeding supplement (VITAL HIGH PROTEIN)  1,000 mL Per Tube Q24H  . ipratropium-albuterol  3 mL Nebulization TID  . mouth rinse  15 mL Mouth Rinse Q4H  . multivitamin  1 tablet Oral BID  . pantoprazole (PROTONIX) IV  40 mg Intravenous Q24H  . sodium chloride HYPERTONIC  4 mL Nebulization TID   Continuous Infusions: . sodium chloride 50 mL/hr at 03/17/18 1000   PRN Meds: acetaminophen **OR** acetaminophen (TYLENOL) oral liquid 160 mg/5 mL **OR** acetaminophen, bisacodyl, docusate, metoprolol tartrate, ondansetron (ZOFRAN) IV   Vital Signs    Vitals:   03/17/18 0816 03/17/18 0817 03/17/18 0900 03/17/18 1000  BP:   106/79 108/76  Pulse:   (!) 132 (!) 110  Resp:      Temp:      TempSrc:      SpO2: 99% 99% 96% 95%  Weight:      Height:        Intake/Output Summary (Last 24 hours) at 03/17/2018 1212 Last data filed at 03/17/2018 1000 Gross per 24 hour  Intake 1085.61 ml  Output 525 ml  Net 560.61 ml   Filed Weights   03/15/18 0500 03/16/18 0300 03/17/18 0200  Weight: 69.3 kg 79.6 kg 80 kg    Telemetry    Atrial fibrillation with ventricular rates currently in the 80's to 110's. Rates were in the 120's to 140's earlier this morning but have improved since receiving IV Lopressor. Multiple PVCs also noted. - Personally  Reviewed  Physical Exam   GEN: Elderly and frail Caucasian female resting comfortably in no acute distress.  Neck: Supple. Cardiac: Borderline tachycardic with irregularly irregular rhythm. No murmurs, rubs, or gallops.  Respiratory: Mild increased work of breathing. Clear relatively clear to auscultation anteriorly. GI: Abdomen soft, non-distended, and non-tender to palpation. Bowel sounds present. MS: Trace edema of bilateral lower extremities. Edema of left upper extremity. Skin: Warm and dry. Neuro:  Left hemiparesis. Psych: Very fatigued following PT session. Unable to answer questions.  Labs    Chemistry Recent Labs  Lab 03/12/18 0518  03/15/18 0920 03/16/18 0529 03/17/18 0749  NA  --    < > 133* 135 138  K  --    < > 4.0 4.0 4.4  CL  --    < > 105 110 109  CO2  --    < > 18* 15* 16*  GLUCOSE  --    < > 101* 124* 124*  BUN  --    < > 35* 30* 27*  CREATININE  --    < > 0.88 0.97 0.95  CALCIUM  --    < > 7.9* 8.3* 9.1  PROT 5.3*  --   --   --   --  ALBUMIN 2.0*  --   --   --   --   AST 68*  --   --   --   --   ALT 136*  --   --   --   --   ALKPHOS 435*  --   --   --   --   BILITOT 0.9  --   --   --   --   GFRNONAA  --    < > >60 57* 58*  GFRAA  --    < > >60 >60 >60  ANIONGAP  --    < > _0 < > = values in this interval not displayed.     Hematology Recent Labs  Lab 03/15/18 0602 03/16/18 0529 03/17/18 0749  WBC 17.0* 17.7* 22.0*  RBC 3.81* 3.59* 4.10  HGB 9.8* 9.2* 10.8*  HCT 32.0* 30.3* 35.4*  MCV 84.0 84.4 86.3  MCH 25.7* 25.6* 26.3  MCHC 30.6 30.4 30.5  RDW 18.7* 19.0* 19.9*  PLT 301 266 268    Cardiac Enzymes No results for input(s): TROPONINI in the last 168 hours.  No results for input(s): TROPIPOC in the last 168 hours.   BNP No results for input(s): BNP, PROBNP in the last 168 hours.   DDimer No results for input(s): DDIMER in the last 168 hours.   Radiology    Mr Jodene Nam Head Wo Contrast  Result Date: 03/15/2018 CLINICAL DATA:   Status post revascularization of right M1 occlusion. Right MCA infarct. EXAM: MRI HEAD WITHOUT CONTRAST MRA HEAD WITHOUT CONTRAST TECHNIQUE: Multiplanar, multiecho pulse sequences of the brain and surrounding structures were obtained without intravenous contrast. Angiographic images of the head were obtained using MRA technique without contrast. COMPARISON:  CTA head and neck 03/14/2017. FINDINGS: MRI HEAD FINDINGS Brain: Diffusion-weighted images demonstrate a large right MCA territory infarct involving the right frontal and parietal lobe. There is involving the superior temporal lobe and insular cortex. Right lentiform nucleus and caudate are involved. T2 and FLAIR sequences demonstrate diffuse edema. There is petechial hemorrhage throughout the infarct territory. Petechial hemorrhage is noted in the right basal ganglia as well. No parenchymal hemorrhage is present. There is mass effect with edema in the cortex. There is partial effacement of the right lateral ventricle. Midline shift measures 6 mm at the foramen of Monro. No new infarct is present. Moderate diffuse white matter disease is present in the left hemisphere. Brainstem is within normal limits. Remote lacunar infarcts are again noted in the cerebellum bilaterally. The internal auditory canals are within normal limits. Vascular: Flow is present in the major intracranial arteries. Skull and upper cervical spine: Fluid in the nasopharynx is likely related to intubation. Exaggerated cervical lordosis is again noted. Sinuses/Orbits: Mild mucosal thickening is present throughout the ethmoid air cells. There is minimal mucosal thickening in the right maxillary sinus. Small mastoid effusions are present. Bilateral lens replacements are noted. Globes and orbits are otherwise unremarkable. MRA HEAD FINDINGS The internal carotid arteries are patent bilaterally. Mild atherosclerotic changes are noted in the cavernous segments. Flow is maintained within the right  ICA, A1, and M1 segments. Left A1 and M1 segments are normal. ACA and MCA branch vessels are normal bilaterally. The vertebrobasilar junction is below the field of view. Basilar artery is normal. AICA vessels are normal. Both posterior cerebral arteries originate from basilar tip. A left posterior communicating artery is present. PCA branch vessels are normal. IMPRESSION: 1. Large MCA territory infarct involves the  basal ganglia and cortex. 2. Petechial hemorrhage throughout the infarct without parenchymal hemorrhage. 3. Mass effect with partial effacement of the right lateral ventricle and 6 mm of right-to-left midline shift. 4. Left cerebral hemisphere white matter disease consistent with underlying microvascular ischemia. 5. MRA demonstrates persistent patent flow within the right ICA and MCA. Electronically Signed   By: San Morelle M.D.   On: 03/15/2018 16:12   Mr Brain Wo Contrast  Result Date: 03/15/2018 CLINICAL DATA:  Status post revascularization of right M1 occlusion. Right MCA infarct. EXAM: MRI HEAD WITHOUT CONTRAST MRA HEAD WITHOUT CONTRAST TECHNIQUE: Multiplanar, multiecho pulse sequences of the brain and surrounding structures were obtained without intravenous contrast. Angiographic images of the head were obtained using MRA technique without contrast. COMPARISON:  CTA head and neck 03/14/2017. FINDINGS: MRI HEAD FINDINGS Brain: Diffusion-weighted images demonstrate a large right MCA territory infarct involving the right frontal and parietal lobe. There is involving the superior temporal lobe and insular cortex. Right lentiform nucleus and caudate are involved. T2 and FLAIR sequences demonstrate diffuse edema. There is petechial hemorrhage throughout the infarct territory. Petechial hemorrhage is noted in the right basal ganglia as well. No parenchymal hemorrhage is present. There is mass effect with edema in the cortex. There is partial effacement of the right lateral ventricle. Midline  shift measures 6 mm at the foramen of Monro. No new infarct is present. Moderate diffuse white matter disease is present in the left hemisphere. Brainstem is within normal limits. Remote lacunar infarcts are again noted in the cerebellum bilaterally. The internal auditory canals are within normal limits. Vascular: Flow is present in the major intracranial arteries. Skull and upper cervical spine: Fluid in the nasopharynx is likely related to intubation. Exaggerated cervical lordosis is again noted. Sinuses/Orbits: Mild mucosal thickening is present throughout the ethmoid air cells. There is minimal mucosal thickening in the right maxillary sinus. Small mastoid effusions are present. Bilateral lens replacements are noted. Globes and orbits are otherwise unremarkable. MRA HEAD FINDINGS The internal carotid arteries are patent bilaterally. Mild atherosclerotic changes are noted in the cavernous segments. Flow is maintained within the right ICA, A1, and M1 segments. Left A1 and M1 segments are normal. ACA and MCA branch vessels are normal bilaterally. The vertebrobasilar junction is below the field of view. Basilar artery is normal. AICA vessels are normal. Both posterior cerebral arteries originate from basilar tip. A left posterior communicating artery is present. PCA branch vessels are normal. IMPRESSION: 1. 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 2. Petechial hemorrhage throughout the infarct without parenchymal hemorrhage. 3. Mass effect with partial effacement of the right lateral ventricle and 6 mm of right-to-left midline shift. 4. Left cerebral hemisphere white matter disease consistent with underlying microvascular ischemia. 5. MRA demonstrates persistent patent flow within the right ICA and MCA. Electronically Signed   By: San Morelle M.D.   On: 03/15/2018 16:12   Dg Chest Port 1 View  Result Date: 03/17/2018 CLINICAL DATA:  Follow-up pneumonia EXAM: PORTABLE CHEST 1  VIEW COMPARISON:  03/15/2018 FINDINGS: Cardiac shadow remains enlarged. Aortic calcifications are again seen. The endotracheal tube and nasogastric catheter have been removed in the interval. Persistent masslike density in the left mid lung is seen. Small effusions are noted bilaterally left greater than right. No other focal abnormality is seen. IMPRESSION: Persistent masslike density in the left mid lung. Bilateral small effusions. Electronically Signed   By: Inez Catalina M.D.   On: 03/17/2018 07:11   Dg Chest  Port 1 View  Result Date: 03/15/2018 CLINICAL DATA:  Follow-up CHF. EXAM: PORTABLE CHEST 1 VIEW COMPARISON:  03/17/2018 and older exams. FINDINGS: Masslike area of consolidation in the left mid lung is stable. There are stable prominent bronchovascular and interstitial markings bilaterally. There is confluent opacity at the left lung base, unchanged. Small left and probable small right pleural effusions. No pneumothorax. Endotracheal tube tip projects 3.3 cm above the Carina. Nasal/orogastric tube passes below the diaphragm into the stomach. IMPRESSION: 1. Similar appearance to the previous day's study. 2. Masslike opacity in the left mid lung. Left lung base opacity most likely atelectasis although pneumonia is possible. Small left and probable small right pleural effusions. No overt pulmonary edema. There are chronically prominent bronchovascular and interstitial markings. Electronically Signed   By: Lajean Manes M.D.   On: 03/15/2018 19:17    Cardiac Studies   Echocardiogram 06/20/2016: Study Conclusions: - Left ventricle: The cavity size was normal. There was mild   concentric hypertrophy. Systolic function was mildly to   moderately reduced. The estimated ejection fraction was in the   range of 40% to 45%. Hypokinesis of the anterior and   anterolateral with akinesis of the inferolateral myocardium.   Doppler parameters are consistent with abnormal left ventricular   relaxation (grade 1  diastolic dysfunction). Doppler parameters   are consistent with indeterminate ventricular filling pressure. - Aortic valve: Transvalvular velocity was within the normal range.   There was no stenosis. There was no regurgitation. - Mitral valve: Transvalvular velocity was within the normal range.   There was no evidence for stenosis. There was mild regurgitation. - Right ventricle: Systolic function was normal. - Atrial septum: No defect or patent foramen ovale was identified   by color flow Doppler. - Tricuspid valve: There was trivial regurgitation. - Pulmonary arteries: Systolic pressure was within the normal   range. PA peak pressure: 21 mm Hg (S). _______________  Left Heart Catheterization 03/02/2016:  Prox RCA to Mid RCA lesion, 90 %stenosed.  Dist LAD lesion, 85 %stenosed.    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 The right coronary is a very large territory and also has left right collaterals from the left coronary system. The likely scenario is the patient's inferolateral infarction occurred due to thrombotic occlusion and she subsequently had on pain is recanalization.  Widely patent circumflex.  Diffuse 85-90% apical LAD disease.  Elevated left ventricular end-diastolic pressure. Previously documented LVEF in the 30-35% range by noninvasive imaging.  Recommendations:  In absence of angina or ischemia on nuclear scintigraphy, medical therapy would appear to be the treatment of choice.  LV dysfunction is likely secondary to chronic remodeling following inferolateral infarction which occurred remotely.  Guideline mandated heart failure therapy. Discontinuation amlodipine will allow further up titration of heart failure agents.  Patient Profile   Kelly Benjamin is a 78 y.o. female with a history of of CAD, ischemic cardiomyopathy with EF of 40-45% on Echo in 06/2016, hyperlipidemia, COPD, and left breast cancer  s/p radiation and lumpectomy who presented to the [facility] ED on 03/08/2017 for evaluation of worsening shortness of breath and edema. On arrival, she was found to have profound anemia with a hemoglobin of 5.6. Chest x-ray consistent with left upper lobe pneumonia. Troponin also elevated. Cardiology was consulted for acute on chronic systolic CHF and NSTEMI.  Assessment & Plan    Acute Hypoxemic Respiratory Failure  - Extubated yesterday - Per primary team.  Acute  Combined Systolic and Diastolic CHF  - Cardiomyopathy may be ischemic with known CAD and newly reduced EF. - BNP elevated at 3,356.1. - Echo showed LVEF of 15-20% (down from 40-45% in 06/2016) with diffuse hypokinesis and grade 2 diastolic dysfunction.   - Net positive 7.3 L this admission. Diuresis has been limited by hypotension.  - Continue to hold Lasix at this time due to soft BP. - No ACEi/ARB or PO beta blocker due to soft BP.  New Onset Atrial Fibrillation  - Telemetry shows atrial fibrillation with ventricular rates currently in the 80's to 110's. Ventricular rates were in the 120's to 140's earlier this morning for about 1 hour but have improved since receiving PRN dose of IV Lopressor.  - Continue IV Lopressor PRN. - Patient will need to pass swallow study before PO beta-blockers can be restarted. - Per Neurology, consider starting DOAC 7 days post stroke. Will defer to MD and Neurology.  Acute Embolic Stroke and Cerebral Edema - Felt to be due to new onset atrial fibrillation and cardiomyopathy with reduced EF. - Patient failed swallow study yesterday.  - Per Neurology, consider starting DOAC 7 days post stroke given size of infarct and hemorrhagic conversion. - Management per Neurology and primary team.   Elevated Troponin with Known CAD - Troponin peaked at 1.35. - Continue Lipitor and Zetia. - No coronary angiogram anticipated during this hospital stay. Follow-up as outpatient.  Acute Anemia of Unknown  Etiology - Hemoglobin 5.6 on admission (11.3 in 02/2016).  - Patient received multiple blood transfusions with last one being on 1/10. - Hemoglobin stable at 10.8 today. - Management per primary team.  Community Acquired Pneumonia - Continue antibiotics per primary team.    For questions or updates, please contact [place] Please consult [email or web address] for contact info under        Signed, Darreld Mclean, PA-C  03/17/2018, 12:12 PM     Personally seen and examined. Agree with above.  Unfortunately very fatigued, not very conversant.  No chest pain.  Physical exam: Frail, elderly with irregularly irregular rhythm, now less than 100 no murmurs with mildly increased work of breathing trace edema left hemiparesis  Laboratory reviewed Telemetry reviewed personally-atrial fibrillation as fast as 140 earlier this morning.  78 year old with known CAD, ischemic cardiomyopathy EF 40, now 15-20% with profound anemia hemoglobin 5.6 on admission, left upper lobe pneumonia, acute on chronic systolic heart failure.  Acute hypoxemic respiratory failure - Extubated, slowly progressing.  Antibiotics.  Chronic systolic heart failure - EF has decreased, likely a result of recent severe anemia, stroke with concomitant paroxysmal atrial fibrillation. - Unable to utilize ACE inhibitor because of low BP.   -Utilizing low-dose beta-blocker at this time.  New onset atrial fibrillation paroxysmal - 140s earlier today.  Unable to take p.o.  IV metoprolol helped.  Agree with metoprolol 5 mg IV every 6 hours to keep heart rate less than 110. - Consider starting Eliquis 7 to 14 days post stroke if okay with neurology  Elevated troponin peak 1.35 - Rise and fall of troponin most likely secondary to demand ischemia in the setting of severe anemia with underlying coronary artery disease and stroke and chronic systolic heart failure.  Not acute coronary syndrome.  I do not think this is a typical  non-ST elevation myocardial infarction.  Nonetheless, this does increase her cardiovascular mortality/morbidity.  No further invasive cardiac evaluation.  Severe anemia of 5.6 on admission -Currently stable.  GI. -Aspirin has been started.  Complex medical decision making.  Candee Furbish, MD

## 2018-03-17 NOTE — Evaluation (Signed)
Speech Language Pathology Evaluation Patient Details Name: Kelly Benjamin MRN: [id number] DOB: 1940/06/02 Today's Date: 03/17/2018 Time: 7858-8502 SLP Time Calculation (min) (ACUTE ONLY): 10 min  Problem List:  Patient Active Problem List   Diagnosis Date Noted  . CHF (congestive heart failure) ([place])   . Respiratory failure ([place])   . Symptomatic anemia   . Essential hypertension   . Leukocytosis   . 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 Abnormal CXR 03/11/2018  . Atrial fibrillation ([place]) 03/11/2018  . Acute combined systolic and diastolic heart failure ([place])   . Acute respiratory distress   . Normocytic anemia 03/13/2018  . PNA (pneumonia) 03/24/2018  . NSTEMI (non-ST elevated myocardial infarction) ([place]) 03/06/2018  . Dyslipidemia, goal LDL below 70   . Hyperlipidemia 07/09/2017  . CHF NYHA class III, acute on chronic, systolic    . CAD in native artery   . Hypoxia   . SOB (shortness of breath)   . History of non-ST elevation myocardial infarction (NSTEMI)   . Ischemic cardiomyopathy   . Abnormal nuclear stress test   . COPD (chronic obstructive pulmonary disease) ([place]) 02/28/2016  . Elevated troponin 02/28/2016  . Hyperglycemia 02/28/2016   Past Medical History:  Past Medical History:  Diagnosis Date  . Breast cancer ([place]) 09/2017   left  . CAD in native artery 02/28/2016   2V CAD -medical rx  . COPD (chronic obstructive pulmonary disease) ([place])   . Dyslipidemia, goal LDL below 70   . Hypertension   . Ischemic cardiomyopathy    EF improved to 40-45% on Entresto  . Personal history of radiation therapy    Past Surgical History:  Past Surgical History:  Procedure Laterality Date  . APPENDECTOMY    . BREAST LUMPECTOMY  09/2017  . BREAST SURGERY  2000  . CARDIAC CATHETERIZATION N/A 03/02/2016   Procedure: Left Heart Cath and Coronary Angiography;  Surgeon: Belva Crome, MD;  Location: [facility];  Service: Cardiovascular;  Laterality:  N/A;  . CATARACT EXTRACTION    . KNEE SURGERY     HPI:  78 year old with ischemic cardiomyopathy admitted with pneumonia, non-ST elevation MI, symptomatic anemia, atrial fibrillation.  Developed acute ischemic stroke status post IR thrombectomy. Intubated from 1/10 to 1/12. Remains hemi-paretic on the left side, weaning.  MRI of brain: Showing a large right MCA territory infarct involving the basal ganglia there are petechial hemorrhage throughout the infarct area without parenchymal hemorrhage.  There is partial effacement of the right lateral ventricle with 6 mm right to left shift.     Assessment / Plan / Recommendation Clinical Impression  Pt presents with right hemisphere communication deficits marked by left inattention, decreased sustained attention to verbal task, decreased working memory.  Pt's speech intelligibility is only minimally impacted, but affected primarily by tachypnea.  She is oriented today to time, place, and basic situation.  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 Psychologist, prison and probation services.  There is significant right gaze preference and there are focal CN deficits on left.  Recommend ongoing assessment as stamina allows; SLP f/u to address cognition and functional cognitive-communication.       SLP Assessment  SLP Recommendation/Assessment: Patient needs continued Speech Lanaguage Pathology Services    Follow Up Recommendations  Inpatient Rehab    Frequency and Duration min 2x/week  2 weeks      SLP Evaluation Cognition  Overall Cognitive Status: Impaired/Different from baseline Arousal/Alertness: Awake/alert Orientation  Level: Oriented to person;Oriented to place;Oriented to time Attention: Sustained Sustained Attention: Impaired Memory: Impaired Memory Impairment: Retrieval deficit Awareness: Impaired Safety/Judgment: Impaired       Comprehension  Auditory Comprehension Overall Auditory Comprehension:  Appears within functional limits for tasks assessed Reading Comprehension Reading Status: Impaired Sentence Level: Impaired    Expression Expression Primary Mode of Expression: Verbal Verbal Expression Overall Verbal Expression: Appears within functional limits for tasks assessed Written Expression Dominant Hand: Right   Oral / Motor  Oral Motor/Sensory Function Overall Oral Motor/Sensory Function: Moderate impairment Facial ROM: Reduced left;Suspected CN VII (facial) dysfunction Facial Symmetry: Abnormal symmetry left;Suspected CN VII (facial) dysfunction Facial Strength: Reduced left;Suspected CN VII (facial) dysfunction Lingual ROM: Reduced left;Suspected CN XII (hypoglossal) dysfunction Lingual Symmetry: Abnormal symmetry left;Suspected CN XII (hypoglossal) dysfunction Motor Speech Overall Motor Speech: Impaired Respiration: Impaired Level of Impairment: Sentence Resonance: Within functional limits Articulation: Within functional limitis Motor Speech Errors: Not applicable   GO                    Kelly Benjamin 03/17/2018, 1:50 PM  Kelly Benjamin, [place] Office number [phone] Pager [phone]

## 2018-03-17 NOTE — Progress Notes (Signed)
[facility] TEAM 1 - Stepdown/ICU TEAM  Kelly Benjamin  TFT:[id number] DOB: 1941/02/19 DOA: 03/26/2018 PCP: Maurice Small, MD    Brief Narrative:  78 year old woman with history of CAD, ischemic cardiomyopathy, CHF (EF 40-45% in 2018 and 15-20% in 2020), COPD, and HTN who was admitted on1/4/2020with dyspnea on exertion and BLE edema and found to have left upper lobe pneumonia, acute anemia (hgb 5.6), NSTEMI, and new onset atrial fibrillation. Initially required BiPAP. She was given Rocephin and azithromycin but this was transitioned to vanc/cefepime. She was diuresed during the hospital course. She developed new onset left sided weakness and gaze deviation. CTA noted total occlusion of right proximal M1 segment without collaterals. Underwent right common carotid arteriogram and found to have occluded right ICA terminus, right MCA and right ACA. She underwent thrombectomy with IR 03/07/2018.   Significant Events: 1/4 admit, working diagnosis of CAP, complicated by new onset atrial fibrillation, non-ST elevation MI and new heart failure 1/10 thrombectomy, transfer to ICU 1/11 Remains hemi-paretic on the left side, weaning. MRI of the brain showed large MCA territory infarct involving basal ganglia and cortex, petechial hemorrhage throughout the infarct without parenchymal hemorrhage.  Mass-effect with partial effacement of the right lateral ventricle and 6 mm right-to-left midline shift. 1/12 Patient currently extubated.  On oxygen by nasal cannula.  Subjective: All active issues are being addressed by Neurology and PCCM, who both examined the pt today.  Assessment & Plan:  Acute Right MCA CVA, Acute Right ICA Occlusion s/p IR Embolectomy 1/10 Care per Neurology  Acute hypoxic respiratory failure - PNA Completed a course of cefepime for suspected pneumonia  Acute kidney injury resolved  COPD Care per PCCM   Hypotension  NSTEMI troponin peaked to 1.35 on 1/5 - per Cardiology  absence of angina or ischemia on nuclear scintigraphy, recommend medical therapy  Acute on chronic combined systolic and diastolic CHF Holding diuresis and Lopressor at this time given hypotension - Cardiology following  New onset atrial fibrillation Care per Cardiology   Acute symptomatic anemia status post 2 units PRBCs  Transaminitis Monitor LFTs  DVT prophylaxis: SCDs Code Status: FULL CODE Family Communication: no family present at time of exam  Disposition Plan:   Consultants:  Neurology PCCM Cardiology IR   Antimicrobials:  None presently   Objective: Blood pressure 115/78, pulse (!) 119, temperature (!) 97.5 F (36.4 C), temperature source Axillary, resp. rate (!) 25, height '5\' 10"'$  (1.778 m), weight 80 kg, SpO2 100 %.  Intake/Output Summary (Last 24 hours) at 03/17/2018 1642 Last data filed at 03/17/2018 1400 Gross per 24 hour  Intake 1096.27 ml  Output 525 ml  Net 571.27 ml   Filed Weights   03/15/18 0500 03/16/18 0300 03/17/18 0200  Weight: 69.3 kg 79.6 kg 80 kg    Examination: No exam by TRH today   CBC: Recent Labs  Lab 03/15/18 0602 03/16/18 0529 03/17/18 0749  WBC 17.0* 17.7* 22.0*  NEUTROABS 14.2*  --   --   HGB 9.8* 9.2* 10.8*  HCT 32.0* 30.3* 35.4*  MCV 84.0 84.4 86.3  PLT 301 266 706   Basic Metabolic Panel: Recent Labs  Lab 03/25/2018 0354 03/15/18 0602 03/15/18 0920 03/16/18 0529 03/17/18 0749  NA 127*  --  133* 135 138  K 3.8  --  4.0 4.0 4.4  CL 98  --  105 110 109  CO2 21*  --  18* 15* 16*  GLUCOSE 128*  --  101* 124* 124*  BUN 48*  --  35* 30* 27*  CREATININE 1.28*  --  0.88 0.97 0.95  CALCIUM 8.0*  --  7.9* 8.3* 9.1  MG 2.3  --   --  2.2 2.4  PHOS  --  3.1  --   --   --    GFR: Estimated Creatinine Clearance: 54.5 mL/min (by C-G formula based on SCr of 0.95 mg/dL).  Liver Function Tests: Recent Labs  Lab 03/12/18 0518  AST 68*  ALT 136*  ALKPHOS 435*  BILITOT 0.9  PROT 5.3*  ALBUMIN 2.0*     Coagulation Profile: Recent Labs  Lab 03/20/2018 2201  INR 1.35    HbA1C: Hgb A1c MFr Bld  Date/Time Value Ref Range Status  03/15/2018 06:02 AM 6.1 (H) 4.8 - 5.6 % Final    Comment:    (NOTE) Pre diabetes:          5.7%-6.4% Diabetes:              >6.4% Glycemic control for   <7.0% adults with diabetes   02/29/2016 05:01 AM 5.4 4.8 - 5.6 % Final    Comment:    (NOTE)         Pre-diabetes: 5.7 - 6.4         Diabetes: >6.4         Glycemic control for adults with diabetes: <7.0     CBG: Recent Labs  Lab 03/09/2018 2024  GLUCAP 133*    Recent Results (from the past 240 hour(s))  Culture, blood (routine x 2) Call MD if unable to obtain prior to antibiotics being given     Status: None   Collection Time: 03/20/2018  4:14 PM  Result Value Ref Range Status   Specimen Description BLOOD RIGHT ARM  Final   Special Requests   Final    BOTTLES DRAWN AEROBIC AND ANAEROBIC Blood Culture results may not be optimal due to an inadequate volume of blood received in culture bottles   Culture   Final    NO GROWTH 5 DAYS Performed at [facility], [place] [address], [place], [place] [address]    Report Status 03/13/2018 FINAL  Final  Culture, blood (routine x 2) Call MD if unable to obtain prior to antibiotics being given     Status: None   Collection Time: 03/09/2018  4:21 PM  Result Value Ref Range Status   Specimen Description BLOOD RIGHT HAND  Final   Special Requests   Final    AEROBIC BOTTLE ONLY Blood Culture results may not be optimal due to an inadequate volume of blood received in culture bottles   Culture   Final    NO GROWTH 5 DAYS Performed at [facility], [place] [address], [place], [place] [address]    Report Status 03/13/2018 FINAL  Final  MRSA PCR Screening     Status: None   Collection Time: 04/01/2018 11:45 PM  Result Value Ref Range Status   MRSA by PCR NEGATIVE NEGATIVE Final    Comment:        The GeneXpert MRSA Assay (FDA approved for NASAL  specimens only), is one component of a comprehensive MRSA colonization surveillance program. It is not intended to diagnose MRSA infection nor to guide or monitor treatment for MRSA infections. Performed at [facility], [place] [address] [place], [place] [address]   Culture, respiratory (non-expectorated)     Status: None   Collection Time: 03/15/18 11:48 AM  Result Value Ref Range Status  Specimen Description TRACHEAL ASPIRATE  Final   Special Requests NONE  Final   Gram Stain NO WBC SEEN NO ORGANISMS SEEN   Final   Culture   Final    FEW Consistent with normal respiratory flora. Performed at [facility], [place] [address] [place], [place] [address]    Report Status 03/17/2018 FINAL  Final     Scheduled Meds: .  stroke: mapping our early stages of recovery book   Does not apply Once  . arformoterol  15 mcg Nebulization BID  . aspirin  325 mg Oral Daily  . atorvastatin  20 mg Oral QHS  . budesonide (PULMICORT) nebulizer solution  0.25 mg Nebulization BID  . chlorhexidine gluconate (MEDLINE KIT)  15 mL Mouth Rinse BID  . ezetimibe  10 mg Oral Daily  . [START ON 03/18/2018] feeding supplement (PRO-STAT SUGAR FREE 64)  30 mL Per Tube Daily  . ipratropium-albuterol  3 mL Nebulization TID  . mouth rinse  15 mL Mouth Rinse Q4H  . multivitamin  1 tablet Oral BID  . pantoprazole (PROTONIX) IV  40 mg Intravenous Q24H  . sodium chloride HYPERTONIC  4 mL Nebulization TID     LOS: 9 days   Cherene Altes, MD [facility] Office  [phone] Pager - Text Page per Shea Evans  If 7PM-7AM, please contact night-coverage per Amion 03/17/2018, 4:42 PM

## 2018-03-17 NOTE — Progress Notes (Signed)
Physical Therapy Treatment Patient Details Name: Kelly Benjamin MRN: [id number] DOB: 02-24-1941 Today's Date: 03/17/2018    History of Present Illness 78 y.o. female admitted on 03/07/2018 for SOB.  Pt initally dx with PNA, NSTEMI vs demand ischemia vs ACS.  Pt with symptomatic anemia (GI consulted ro r/o bleed).  Pt converted to A-fib with RVR on and off of HFNC and BiPAP.  Pt's course complicated when code stroke was called on 03/13/2018 due to sudden onset L sided weakness.  Pt underwent revascularization of R ICA. tPA was not given due to suspected GIB.  Pt intubated for procedure and extubated 02/14/19.  Pt with other significant PMH of HTN, COPD, CAD, L Breast CA, knee surgery.      PT Comments    Pt able to tolerate standing x1 trial today with maxAx2. Pt with increased RR and difficulty clearing airway despite SPO2 >92% on RA. Pt fatigues quickly and has difficulty maintaining cervical extension. Pt with increased L LE movement today and was able to achieve terminal knee extension in standing. Pt cont to require maxAx2 for mobility. Cont to recommend CIR upon d/c. Acute PT to cont to follow.   Follow Up Recommendations  CIR     Equipment Recommendations  Wheelchair (measurements PT);Wheelchair cushion (measurements PT);3in1 (PT);Hospital bed    Recommendations for Other Services Rehab consult     Precautions / Restrictions Precautions Precautions: Fall Precaution Comments: L side weakness, R gaze Restrictions Weight Bearing Restrictions: No    Mobility  Bed Mobility Overal bed mobility: Needs Assistance Bed Mobility: Supine to Sit;Sit to Supine     Supine to sit: Max assist;HOB elevated;+2 for physical assistance Sit to supine: Total assist;HOB elevated;+2 for physical assistance   General bed mobility comments: pt attempted to assist with R UE and did initiate bilat LEs with max encourgament however ultimately required maxAx2 to complete  Transfers Overall transfer  level: Needs assistance Equipment used: (2 person lift with gait belt and bed pad) Transfers: Sit to/from Stand Sit to Stand: Max assist;+2 physical assistance         General transfer comment: max verbal cues to engage L LE, once tactile cues provided pt able to achieve terminal knee extension. pt remains with significant trunk and neck flexion, family reports that her head is always flexed  Ambulation/Gait                 Stairs             Wheelchair Mobility    Modified Rankin (Stroke Patients Only) Modified Rankin (Stroke Patients Only) Pre-Morbid Rankin Score: No symptoms Modified Rankin: Severe disability     Balance Overall balance assessment: Needs assistance Sitting-balance support: Feet supported;Single extremity supported Sitting balance-Leahy Scale: Poor Sitting balance - Comments: pt with R LE pushing towards the L, when pt places R UE in lap pt can maintain midline with minA for 1 min. Pt fatigues quickly and ultimately requires modA to maintain EOB sitting Balance. Pt tolerrated EOB x40min. SpO2 >92% on RA however RR high in 30s-40s Postural control: Left lateral lean                                  Cognition Arousal/Alertness: Awake/alert Behavior During Therapy: WFL for tasks assessed/performed Overall Cognitive Status: Impaired/Different from baseline Area of Impairment: Attention;Safety/judgement;Awareness;Problem solving  Current Attention Level: Sustained Memory: Decreased short-term memory Following Commands: Follows one step commands with increased time Safety/Judgement: Decreased awareness of deficits(L sided neglect)   Problem Solving: Slow processing;Difficulty sequencing;Requires verbal cues;Requires tactile cues General Comments: pt can turn head to the L to command but eyes stay to the R, able with max verbal cues      Exercises General Exercises - Lower Extremity Long Arc Quad:  AROM;Left;10 reps;Seated(with tactile cueing) Heel Raises: AAROM;10 reps;Left;Supine    General Comments General comments (skin integrity, edema, etc.): Pt with labored breathing despite SPO2 >92% on RA      Pertinent Vitals/Pain Pain Assessment: No/denies pain    Home Living                      Prior Function            PT Goals (current goals can now be found in the care plan section) Acute Rehab PT Goals Patient Stated Goal: none stated Progress towards PT goals: Progressing toward goals    Frequency    Min 4X/week      PT Plan Current plan remains appropriate    Co-evaluation              AM-PAC PT "6 Clicks" Mobility   Outcome Measure  Help needed turning from your back to your side while in a flat bed without using bedrails?: Total Help needed moving from lying on your back to sitting on the side of a flat bed without using bedrails?: Total Help needed moving to and from a bed to a chair (including a wheelchair)?: Total Help needed standing up from a chair using your arms (e.g., wheelchair or bedside chair)?: Total Help needed to walk in hospital room?: Total Help needed climbing 3-5 steps with a railing? : Total 6 Click Score: 6    End of Session Equipment Utilized During Treatment: Gait belt Activity Tolerance: Patient tolerated treatment well Patient left: in bed;with call bell/phone within reach;with bed alarm set;with family/visitor present Nurse Communication: Mobility status PT Visit Diagnosis: Muscle weakness (generalized) (M62.81);Difficulty in walking, not elsewhere classified (R26.2);Other symptoms and signs involving the nervous system (R29.898);Hemiplegia and hemiparesis Hemiplegia - Right/Left: Left Hemiplegia - dominant/non-dominant: Non-dominant Hemiplegia - caused by: Cerebral infarction     Time: 9735-3299 PT Time Calculation (min) (ACUTE ONLY): 38 min  Charges:  $Therapeutic Exercise: 8-22 mins $Therapeutic Activity:  23-37 mins                     Kittie Plater, PT, DPT Acute Rehabilitation Services Pager #: [phone] Office #: [phone]    Berline Lopes 03/17/2018, 12:55 PM

## 2018-03-17 NOTE — Progress Notes (Signed)
  Speech Language Pathology Treatment: Dysphagia  Patient Details Name: Kelly Benjamin MRN: [id number] DOB: Jul 03, 1940 Today's Date: 03/17/2018 Time: 0370-4888 SLP Time Calculation (min) (ACUTE ONLY): 10 min  Assessment / Plan / Recommendation Clinical Impression  Pt had just had cortrak placed upon arrival for speech/communication evaluation and swallow re-assessment (speech/language evaluation documentation posted separately).  Pt alert, amenable to PO trials.  HOB elevated and pt repositioned to optimize performance.  Limited ice chips were provided, with subsequent consistent coughing, bolus loss left side of mouth without awareness, and production of secretions that were suctioned. Pt tachypneic.  PO trials ceased due to concerns for aspiration and respiratory status. SLP will continue to follow for PO readiness; RN aware.     HPI HPI: Kelly Benjamin with ischemic cardiomyopathy admitted with pneumonia, non-ST elevation MI, symptomatic anemia, atrial fibrillation.  Developed acute ischemic stroke status post IR thrombectomy. Intubated from 1/10 to 1/12. Remains hemi-paretic on the left side, weaning.  MRI of brain: Showing a large right MCA territory infarct involving the basal ganglia there are petechial hemorrhage throughout the infarct area without parenchymal hemorrhage.  There is partial effacement of the right lateral ventricle with 6 mm right to left shift.        SLP Plan  Continue with current plan of care  Patient needs continued Speech Lanaguage Pathology Services    Recommendations  Diet recommendations: NPO                Oral Care Recommendations: Oral care QID Follow up Recommendations: Inpatient Rehab SLP Visit Diagnosis: Dysphagia, unspecified (R13.10) Plan: Continue with current plan of care       GO              [name] L. Tivis Ringer, [place] CCC/SLP Acute Rehabilitation Services Office number [phone] Pager [phone]   Juan Quam  [name] 03/17/2018, 1:53 PM

## 2018-03-18 ENCOUNTER — Encounter (HOSPITAL_COMMUNITY): Payer: Self-pay | Admitting: Interventional Radiology

## 2018-03-18 DIAGNOSIS — J181 Lobar pneumonia, unspecified organism: Secondary | ICD-10-CM

## 2018-03-18 DIAGNOSIS — R609 Edema, unspecified: Secondary | ICD-10-CM

## 2018-03-18 LAB — GLUCOSE, CAPILLARY
Glucose-Capillary: 142 mg/dL — ABNORMAL HIGH (ref 70–99)
Glucose-Capillary: 165 mg/dL — ABNORMAL HIGH (ref 70–99)
Glucose-Capillary: 169 mg/dL — ABNORMAL HIGH (ref 70–99)
Glucose-Capillary: 155 mg/dL — ABNORMAL HIGH (ref 70–99)
Glucose-Capillary: 148 mg/dL — ABNORMAL HIGH (ref 70–99)
Glucose-Capillary: 164 mg/dL — ABNORMAL HIGH (ref 70–99)

## 2018-03-18 LAB — BASIC METABOLIC PANEL
ANION GAP: 10 (ref 5–15)
Anion gap: 11 (ref 5–15)
BUN: 35 mg/dL — AB (ref 8–23)
BUN: 40 mg/dL — ABNORMAL HIGH (ref 8–23)
CALCIUM: 9.6 mg/dL (ref 8.9–10.3)
CO2: 16 mmol/L — ABNORMAL LOW (ref 22–32)
CO2: 16 mmol/L — ABNORMAL LOW (ref 22–32)
Calcium: 9.6 mg/dL (ref 8.9–10.3)
Chloride: 113 mmol/L — ABNORMAL HIGH (ref 98–111)
Chloride: 117 mmol/L — ABNORMAL HIGH (ref 98–111)
Creatinine, Ser: 1.05 mg/dL — ABNORMAL HIGH (ref 0.44–1.00)
Creatinine, Ser: 1.15 mg/dL — ABNORMAL HIGH (ref 0.44–1.00)
GFR calc Af Amer: 60 mL/min — ABNORMAL LOW (ref >60)
GFR calc Af Amer: 54 mL/min — ABNORMAL LOW (ref >60)
GFR calc non Af Amer: 52 mL/min — ABNORMAL LOW (ref >60)
GFR calc non Af Amer: 46 mL/min — ABNORMAL LOW (ref >60)
Glucose, Bld: 154 mg/dL — ABNORMAL HIGH (ref 70–99)
Glucose, Bld: 170 mg/dL — ABNORMAL HIGH (ref 70–99)
Potassium: 4.3 mmol/L (ref 3.5–5.1)
Potassium: 4.1 mmol/L (ref 3.5–5.1)
Sodium: 140 mmol/L (ref 135–145)
Sodium: 143 mmol/L (ref 135–145)

## 2018-03-18 LAB — CBC
HEMATOCRIT: 33.4 % — AB (ref 36.0–46.0)
Hemoglobin: 10.3 g/dL — ABNORMAL LOW (ref 12.0–15.0)
MCH: 26.4 pg (ref 26.0–34.0)
MCHC: 30.8 g/dL (ref 30.0–36.0)
MCV: 85.6 fL (ref 80.0–100.0)
Platelets: 312 10*3/uL (ref 150–400)
RBC: 3.9 MIL/uL (ref 3.87–5.11)
RDW: 20.3 % — ABNORMAL HIGH (ref 11.5–15.5)
WBC: 19.3 10*3/uL — ABNORMAL HIGH (ref 4.0–10.5)
nRBC: 0.1 % (ref 0.0–0.2)

## 2018-03-18 LAB — HEPATITIS PANEL, ACUTE
HCV Ab: <0.1 s/co ratio (ref 0.0–0.9)
Hep A IgM: NEGATIVE
Hep B C IgM: NEGATIVE
Hepatitis B Surface Ag: NEGATIVE

## 2018-03-18 LAB — MAGNESIUM
Magnesium: 2.6 mg/dL — ABNORMAL HIGH (ref 1.7–2.4)
Magnesium: 2.6 mg/dL — ABNORMAL HIGH (ref 1.7–2.4)

## 2018-03-18 LAB — PHOSPHORUS: Phosphorus: 3.3 mg/dL (ref 2.5–4.6)

## 2018-03-18 MED ORDER — FUROSEMIDE 10 MG/ML IJ SOLN
40.0000 mg | Freq: Two times a day (BID) | INTRAMUSCULAR | Status: AC
  Administered 2018-03-18 (×2): 40 mg via INTRAVENOUS
  Filled 2018-03-18 (×2): qty 4

## 2018-03-18 NOTE — Progress Notes (Signed)
Physical Therapy Treatment Patient Details Name: Kelly Benjamin MRN: [id number] DOB: [birth year] Today's Date: 03/18/2018    History of Present Illness 78 y.o. female admitted on 03/21/2018 for SOB.  Pt initally dx with PNA, NSTEMI vs demand ischemia vs ACS.  Pt with symptomatic anemia (GI consulted ro r/o bleed).  Pt converted to A-fib with RVR on and off of HFNC and BiPAP.  Pt's course complicated when code stroke was called on 03/27/2018 due to sudden onset L sided weakness.  Pt underwent revascularization of R ICA. tPA was not given due to suspected GIB.  Pt intubated for procedure and extubated 02/14/19.  Pt with other significant PMH of HTN, COPD, CAD, L Breast CA, knee surgery.      PT Comments    Pt following commands 75% of time but continues to have L hemiplegia, generalized deconditioning, L sided neglect, impaired co-ordination and sequencing. At baseline pt with thoracic kyphosis and forward head with difficulty maintaining upright posture. Pt with constant loose stool t/o session requiring performing hygiene x3. Pt able to sit EOB with maxAx5 min and then completed std pvt transfer to chair. Pt tolerated standing well however unable sequence stepping to chair. Acute PT to cont to follow.    Follow Up Recommendations  CIR     Equipment Recommendations  Wheelchair (measurements PT);Wheelchair cushion (measurements PT);3in1 (PT);Hospital bed    Recommendations for Other Services Rehab consult     Precautions / Restrictions Precautions Precautions: Fall Precaution Comments: L neglect and hemiplegia Restrictions Weight Bearing Restrictions: No    Mobility  Bed Mobility Overal bed mobility: Needs Assistance Bed Mobility: Rolling;Sidelying to Sit Rolling: Max assist;+2 for physical assistance(rolled L/R for hygiene s/p BM) Sidelying to sit: Max assist;+2 for physical assistance       General bed mobility comments: pt with no functional use of L UE, pt assisted minimall with  Bilat LEs, maxA for trunk elevation  Transfers Overall transfer level: Needs assistance Equipment used: (2 person lift with gait belt and chair) Transfers: Sit to/from [employer] Transfers Sit to Stand: Max assist;+2 physical assistance Stand pivot transfers: Max assist;+2 physical assistance       General transfer comment: blocking at L knee, completed 2 standing trials, pt with improved ability to assist with second stand and achieve full upright standing posture. maxA for advancement of LEs/stepping to chair  Ambulation/Gait             General Gait Details: unable   Stairs             Wheelchair Mobility    Modified Rankin (Stroke Patients Only) Modified Rankin (Stroke Patients Only) Pre-Morbid Rankin Score: No symptoms Modified Rankin: Severe disability     Balance Overall balance assessment: Needs assistance Sitting-balance support: Feet supported;Single extremity supported Sitting balance-Leahy Scale: Poor Sitting balance - Comments: pt with R UE pushing towards the left, once hand in lap pt able to maintain midline posture with modA until onset of fatigue then requiring maxA to maintain EOB balance. pt at baseline with thoracic kyphosis and cervical flexion Postural control: Left lateral lean                                  Cognition Arousal/Alertness: Awake/alert Behavior During Therapy: Flat affect Overall Cognitive Status: Impaired/Different from baseline Area of Impairment: Attention;Safety/judgement;Awareness;Problem solving  Current Attention Level: Sustained Memory: Decreased short-term memory Following Commands: Follows one step commands with increased time Safety/Judgement: Decreased awareness of deficits Awareness: Emergent Problem Solving: Slow processing;Difficulty sequencing;Requires verbal cues;Requires tactile cues General Comments: pt able to track to midline but not towrds L       Exercises General Exercises - Lower Extremity Long Arc Quad: AROM;Left;10 reps;Seated    General Comments General comments (skin integrity, edema, etc.): Pt with labored breathing, SpO2 >92% on RA. HR at 88bpm      Pertinent Vitals/Pain Pain Assessment: Faces Faces Pain Scale: No hurt    Home Living                      Prior Function            PT Goals (current goals can now be found in the care plan section) Acute Rehab PT Goals Patient Stated Goal: none stated Progress towards PT goals: Progressing toward goals    Frequency    Min 4X/week      PT Plan Current plan remains appropriate    Co-evaluation PT/OT/SLP Co-Evaluation/Treatment: Yes Reason for Co-Treatment: Complexity of the patient's impairments (multi-system involvement) PT goals addressed during session: Mobility/safety with mobility        AM-PAC PT "6 Clicks" Mobility   Outcome Measure  Help needed turning from your back to your side while in a flat bed without using bedrails?: Total Help needed moving from lying on your back to sitting on the side of a flat bed without using bedrails?: Total Help needed moving to and from a bed to a chair (including a wheelchair)?: Total Help needed standing up from a chair using your arms (e.g., wheelchair or bedside chair)?: Total Help needed to walk in hospital room?: Total Help needed climbing 3-5 steps with a railing? : Total 6 Click Score: 6    End of Session Equipment Utilized During Treatment: Gait belt Activity Tolerance: Patient tolerated treatment well Patient left: in chair;with call bell/phone within reach;with nursing/sitter in room Nurse Communication: Mobility status PT Visit Diagnosis: Muscle weakness (generalized) (M62.81);Difficulty in walking, not elsewhere classified (R26.2);Other symptoms and signs involving the nervous system (R29.898);Hemiplegia and hemiparesis Hemiplegia - Right/Left: Left Hemiplegia - dominant/non-dominant:  Non-dominant Hemiplegia - caused by: Cerebral infarction     Time: 0802-0831 PT Time Calculation (min) (ACUTE ONLY): 29 min  Charges:  $Therapeutic Activity: 8-22 mins                     Kelly Benjamin, PT, DPT Acute Rehabilitation Services Pager #: [phone] Office #: [phone]    Berline Lopes 03/18/2018, 8:53 AM

## 2018-03-18 NOTE — Progress Notes (Addendum)
NAME:  Kelly Benjamin, MRN:  [id number], DOB:  1941-01-15, LOS: 37 ADMISSION DATE:  04/04/2018, CONSULTATION DATE:  03/25/2018 REFERRING MD:  [facility], CHIEF COMPLAINT:  Vent management  History of present illness   78 year old woman with history of CAD, ischemic cardiomyopathy, HFrEF (EF 40-45% in 2018 and now 15-20% in 2020), COPD, HTN admitted on 04/01/2018 with dyspnea on exertion and BLE edema found to have left upper lobe pneumonia, acute symptomatic anemia (hgb 5.6), NSTEMI, and new onset atrial fibrillation. Initially required BiPAP. She was given Rocephin and azithromycin but this was transitioned to vanc/cefepime. She was diuresed during the hospital course. Tonight, she developed new onset left sided weakness and gaze deviation. CTA noted total occlusion of right proximal M1 segment without collaterals. Underwent right common carotid arteriogram and found to have occluded right ICA terminus, right MCA and right ACA. She underwent thrombectomy with IR 03/05/2018.   Past Medical History   CAD, ischemic cardiomyopathy, HFrEF (EF 40-45% in 2018), COPD, HTN, breast cancer, HLD  Significant Hospital Events   1/4> admit, working diagnosis of CAP, complicated by new onset atrial fibrillation, non-ST elevation MI and new heart failure 1/10> thrombectomy, transfer to ICU 1/11: Remains hemi-paretic on the left side, weaning.  MRI of brain: Showing a large MCA territory infarct involving the basal ganglia there are petechial hemorrhage throughout the infarct area without parenchymal hemorrhage.  There is partial effacement of the right lateral ventricle with 6 mm right to left shift.  Antibiotics completed.  Passed spontaneous breathing trial so extubated. Consults:  IR 1/10 Neuro 1/10 GI 1/5 Cards 1/4  Procedures:  1/10> IR thrombectomy  Significant Diagnostic Tests:  CTA head/neck w/ w/o 1/10> 1. Emergent large vessel occlusion involving the proximal right M1 segment. 2. Very  poor collateral flow to the right MCA distribution. 3. 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 4. Opacification of the left vertebral artery above the V1 segment may be retrograde flow. 5. Mild diffuse small vessel disease within the ACA branch vessels, left MCA branch vessels, and posterior circulation.  CT head w/o contrast 1/10> Acute right MCA territory infarct with decreased density in the right lentiform nucleus, right insular cortex, and right M1 and M2 cortex. Hyperdense right MCA suggesting acute thrombus.  CXR 1/7> Rounded opacity is again noted in the left mid lung   CT chest w/o contrast 1/4> Dense left upper lobe airspace disease. Small left pleural effusion noted. Mild interlobular septal thickening. Calcific coronary artery disease. Aortic atherosclerosis and emphysema  TTE 1/6>  - Left ventricle: The cavity size was normal. Wall thickness was increased in a pattern of mild LVH. Systolic function was severely reduced. The estimated ejection fraction was in the range of 15% to 20%. Diffuse hypokinesis. Features are consistent with a pseudonormal left ventricular filling pattern, with concomitant abnormal relaxation and increased filling pressure (grade 2 diastolic dysfunction). Doppler parameters are consistent with high ventricular filling pressure. - Aortic valve: Trileaflet; mildly thickened, mildly calcified   leaflets. - Mitral valve: There was moderate regurgitation. - Left atrium: The atrium was severely dilated. - Right ventricle: Systolic function was moderately reduced. - Right atrium: The atrium was severely dilated. - Tricuspid valve: There was moderate regurgitation. - Pulmonary arteries: Systolic pressure was mildly increased. PA   peak pressure: 41 mm Hg (S). MRI brain:MRI of brain: Showing a large MCA territory infarct involving the basal ganglia there are petechial hemorrhage throughout the infarct area without parenchymal  hemorrhage.  There  is partial effacement of the right lateral ventricle with 6 mm right to left shift  CXR 1/12> peristant L mid-lung density, mass-like in appearance. Small bilateral effusions.  - personally reviewed-  Micro Data:  Blood Cx x 2 1/4 > NGTD Respiratory culture 1/11> no orgs seen Antimicrobials:  Azithromycin 500 mg IV 1/4 > 1/8 Cefepime 1/7 > 1/12 Ceftriaxone 1/4 > 1/6 Vancomycin 1/6 > 1/9  Interim history/subjective:  No acute events overnight Patient continues to protect airway and has been HDS without need for pressors.  Objective   Blood pressure 121/64, pulse 85, temperature 97.7 F (36.5 C), temperature source Axillary, resp. rate (!) 25, height 5\' 10"  (1.778 m), weight 80.8 kg, SpO2 100 %.        Intake/Output Summary (Last 24 hours) at 03/18/2018 1017 Last data filed at 03/18/2018 0700 Gross per 24 hour  Intake 1672.31 ml  Output 380 ml  Net 1292.31 ml   Filed Weights   03/16/18 0300 03/17/18 0200 03/18/18 0500  Weight: 79.6 kg 80 kg 80.8 kg    Examination: General: elderly, frail appearing female. Seated in chair. NAD HEENT: [place]/AT. Tacky mucus membranes. PERRL. Trachea midline  Pulmonary: Scattered crackles. Symmetrical chest wall expansion.  Cardiovascular: RRR, 1+ radial pulses bilaterally  Abdomen: soft, round, non-tender, non-distended. Normoactive x4  Extremities: No obvious joint deformity. No pedal edema  Neuro: Awake, drowsy. Following commands. Does not move LUE   GU: urine clear and yellow  Assessment & Plan:  Resolved Hospital Problems: Hypotension, AKI  Acute R MCA CVA, acute R ICA occlusion s/p embolectomy Plan Per neurology ASA, zetia, statin per CVA team Neurology to consider DOAC, per neuro discretion  NPO as patient failed swallow study  Acute hypoxic respiratory failure CAP with risk factors for pseudomonas: resolved At risk aspiration Extubated and protecting airway Sputum cx neg to date L lung mass persistent   plan Brovanna, pulmicort, duonebs NPO as failed swallow study 3% Saline nebs for thick secretions  Aggressive pulm toilet Will need follow up CXR in 6-8wks to evaluate mass-like opacity   COPD: Plan Continue Brovanna pulmicort and duonebs Goal SpO2 88-92%  NSTEMI trops peaked 1/5 with troponin 1.35  Cardiology following Plan Continue ASA statin zetia    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 Goal Hgb > 7 When stable, possible consideration for EGD to evaluation possible source of bleed   Malnutrition, at risk Failed swallow study  P Enteral access achieved and enteral nutrition started   Best practice:  Diet: NPO  Pain/Anxiety/Delirium protocol (if indicated): APAP VAP protocol (if indicated): n/a DVT prophylaxis: SCDs GI prophylaxis: Protonix Glucose control: Monitor Mobility: PT/OT  Code Status: Full Family Communication: No family. Close friend at bedside.  Disposition: ICU   Eliseo Gum MSN, AGACNP-BC [facility] 03/18/2018, 10:26 AM

## 2018-03-18 NOTE — Progress Notes (Signed)
Occupational Therapy Treatment Patient Details Name: Kelly Benjamin MRN: [id number] DOB: 24-Mar-1940 Today's Date: 03/18/2018    History of present illness 78 y.o. female admitted on 03/15/2018 for SOB.  Pt initally dx with PNA, NSTEMI vs demand ischemia vs ACS.  Pt with symptomatic anemia (GI consulted ro r/o bleed).  Pt converted to A-fib with RVR on and off of HFNC and BiPAP.  Pt's course complicated when code stroke was called on 03/13/2018 due to sudden onset L sided weakness.  Pt underwent revascularization of R ICA. tPA was not given due to suspected GIB.  Pt intubated for procedure and extubated 02/14/19.  Pt with other significant PMH of HTN, COPD, CAD, L Breast CA, knee surgery.     OT comments  Patient supine in bed and agreeable to OT/PT.  Patient incontinent of BM (loose) with no awareness, total assist for hygiene from bed level then became incontinent again with mobility.  Continues to have L hemiparesis, L sided neglect, impaired vision with inability to gaze past midline, and poor balance.  Following commands fairly consistently, but difficulty coordinating and sequencing self care and mobility tasks.  Will continue to follow and CIR remains appropriate.     Follow Up Recommendations  CIR    Equipment Recommendations  Other (comment)(TBD at next venue of care)    Recommendations for Other Services Rehab consult    Precautions / Restrictions Precautions Precautions: Fall Precaution Comments: L neglect and hemiplegia Restrictions Weight Bearing Restrictions: No       Mobility Bed Mobility Overal bed mobility: Needs Assistance Bed Mobility: Rolling;Sidelying to Sit Rolling: Max assist;+2 for physical assistance(rolled L/R for hygiene due to incontient BM) Sidelying to sit: Max assist;+2 for physical assistance       General bed mobility comments: max assist for L side, assisting minimally with L LE but total assist for L UE; max assist for trunk  support  Transfers Overall transfer level: Needs assistance Equipment used: (person lift with gait belt, pad to chair) Transfers: Sit to/from [facility] Sit to Stand: Max assist;+2 physical assistance Stand pivot transfers: Max assist;+2 physical assistance       General transfer comment: blocking at L knee, completed 2 standing trials, pt with improved ability to assist with second stand and achieve full upright standing posture. maxA for advancement of LEs/stepping to chair    Balance Overall balance assessment: Needs assistance Sitting-balance support: Feet supported;Single extremity supported Sitting balance-Leahy Scale: Poor Sitting balance - Comments: pt with R UE pushing towards the left, once hand in lap pt able to maintain midline posture with modA until onset of fatigue then requiring maxA to maintain EOB balance. pt at baseline with thoracic kyphosis and cervical flexion Postural control: Left lateral lean                                 ADL either performed or assessed with clinical judgement   ADL Overall ADL's : Needs assistance/impaired                         Toilet Transfer: Maximal assistance;+2 for physical assistance;+2 for safety/equipment;Stand-pivot Toilet Transfer Details (indicate cue type and reason): simulated to recliner  Toileting- Clothing Manipulation and Hygiene: Total assistance;+2 for physical assistance;Bed level Toileting - Clothing Manipulation Details (indicate cue type and reason): incontinent BM at bed level with total assist for hygiene, continued incontinence with BM during  transfer to chair      Functional mobility during ADLs: Maximal assistance;+2 for physical assistance;+2 for safety/equipment;Cueing for sequencing;Cueing for safety General ADL Comments: continues to present with L hemiparesis and decreased awareness, L lateral lean and foward head posturing      Vision   Additional Comments:  continue assessment, able to turn head towards L side minimally today but gaze remains at midline   Perception     Praxis      Cognition Arousal/Alertness: Awake/alert Behavior During Therapy: Flat affect Overall Cognitive Status: Impaired/Different from baseline Area of Impairment: Attention;Safety/judgement;Awareness;Problem solving;Following commands                   Current Attention Level: Sustained Memory: Decreased short-term memory Following Commands: Follows one step commands with increased time Safety/Judgement: Decreased awareness of deficits(L sided neglect) Awareness: Emergent Problem Solving: Slow processing;Difficulty sequencing;Requires verbal cues;Requires tactile cues General Comments: continues to require increased time to process and initate, continues to have decreased awareness to L side and unaware of loose BMs         Exercises General Exercises - Lower Extremity Long Arc Quad: AROM;Left;10 reps;Seated   Shoulder Instructions       General Comments VSS, SpO2 > 92% on RA with labored breathing (especially while supine), HR 88bpm    Pertinent Vitals/ Pain       Pain Assessment: Faces Faces Pain Scale: No hurt  Home Living                                          Prior Functioning/Environment              Frequency  Min 3X/week        Progress Toward Goals  OT Goals(current goals can now be found in the care plan section)     Acute Rehab OT Goals Patient Stated Goal: none stated Time For Goal Achievement: 03/30/18 Potential to Achieve Goals: Good  Plan      Co-evaluation    PT/OT/SLP Co-Evaluation/Treatment: Yes Reason for Co-Treatment: Complexity of the patient's impairments (multi-system involvement) PT goals addressed during session: Mobility/safety with mobility OT goals addressed during session: ADL's and self-care      AM-PAC OT "6 Clicks" Daily Activity     Outcome Measure   Help from  another person eating meals?: Total Help from another person taking care of personal grooming?: A Lot Help from another person toileting, which includes using toliet, bedpan, or urinal?: Total Help from another person bathing (including washing, rinsing, drying)?: Total Help from another person to put on and taking off regular upper body clothing?: Total Help from another person to put on and taking off regular lower body clothing?: Total 6 Click Score: 7    End of Session    OT Visit Diagnosis: Other abnormalities of gait and mobility (R26.89);Muscle weakness (generalized) (M62.81);Hemiplegia and hemiparesis Hemiplegia - Right/Left: Left Hemiplegia - dominant/non-dominant: Non-Dominant Hemiplegia - caused by: Cerebral infarction   Activity Tolerance Patient tolerated treatment well   Patient Left with call bell/phone within reach;in chair;with chair alarm set;with nursing/sitter in room   Nurse Communication Mobility status        Time: 3474-2595 OT Time Calculation (min): 24 min  Charges: OT General Charges $OT Visit: 1 Visit OT Treatments $Self Care/Home Management : 8-22 mins  Delight Stare, [place] Pager [phone] Office [phone]  Delight Stare 03/18/2018, 10:44 AM

## 2018-03-18 NOTE — Progress Notes (Signed)
[facility] TEAM 1 - Stepdown/ICU TEAM  Kelly Benjamin  RFF:[id number] DOB: [birth year] DOA: 03/13/2018 PCP: Maurice Small, MD    Brief Narrative:  78 year old woman with history of CAD, ischemic cardiomyopathy, CHF (EF 40-45% in 2018 and 15-20% in 2020), COPD, and HTN who was admitted on1/4/2020with dyspnea on exertion and BLE edema and found to have left upper lobe pneumonia, acute anemia (hgb 5.6), NSTEMI, and new onset atrial fibrillation. Initially required BiPAP. She was given Rocephin and azithromycin but this was transitioned to vanc/cefepime. She was diuresed during the hospital course. She developed new onset left sided weakness and gaze deviation. CTA noted total occlusion of right proximal M1 segment without collaterals. Underwent right common carotid arteriogram and found to have occluded right ICA terminus, right MCA and right ACA. She underwent thrombectomy with IR 03/21/2018.   Significant Events: 1/4 admit, working diagnosis of CAP, complicated by new onset atrial fibrillation, non-ST elevation MI and new heart failure 1/10 thrombectomy, transfer to ICU 1/11 Remains hemi-paretic on the left side, weaning. MRI of the brain showed large MCA territory infarct involving basal ganglia and cortex, petechial hemorrhage throughout the infarct without parenchymal hemorrhage.  Mass-effect with partial effacement of the right lateral ventricle and 6 mm right-to-left midline shift. 1/12 Patient currently extubated.  On oxygen by nasal cannula.  Subjective: Neurology was at bedside when I arrived.  Patient lethargic, significantly short of breath.  Occasional coughing.  Responds to verbal stimuli.  No nausea no vomiting other acute events overnight.  Unable to tolerate chest PT.  Assessment & Plan:  Acute Right MCA CVA, Acute Right ICA Occlusion s/p IR Embolectomy 1/10 Care per Neurology  Acute hypoxic respiratory failure - PNA Completed a course of cefepime for suspected  pneumonia  Acute kidney injury resolved  COPD Care per PCCM   Hypotension  NSTEMI troponin peaked to 1.35 on 1/5 - per Cardiology absence of angina or ischemia on nuclear scintigraphy, recommend medical therapy  Acute on chronic combined systolic and diastolic CHF Will diurese with IV Lasix. Patient's weight 3.5 kg on admission to 80.8 kg this morning.  New onset atrial fibrillation Care per Cardiology   Acute symptomatic anemia status post 2 units PRBCs  Transaminitis Monitor LFTs  Goals of care discussion. Patient's friends, 1 of whom he actually has power of attorney was at bedside. Explained patient's prognosis to them with low EF, acute on chronic CHF, A. fib with RVR, symptomatic anemia, non-STEMI, hypotension, poor p.o. intake, severe respiratory distress, and acute stroke. In the situation of the patient actually has a major event her chances of recovery are negligible and her chances of surviving severe event are also less. POA is focused primarily on to treat what is treatable but to maintain comfort as a priority. They would not want to prolong life in any situation just to prolong suffering. Currently CODE STATUS is changed from full code to partial code with BiPAP only when needed. Anticipating poor prognosis going forward.  Palliative care consulted.  DVT prophylaxis: SCDs Code Status: Partial code, BiPAP only. Family Communication: Does not have any family.  Actually has 2).  1 of them has power of attorney. Discussed with him at bedside. Disposition Plan:   Consultants:  Neurology PCCM Cardiology IR   Antimicrobials:  None presently   Objective: Blood pressure (!) 119/59, pulse (!) 102, temperature (!) 97.2 F (36.2 C), temperature source Axillary, resp. rate (!) 22, height _0  (1.778 m), weight 80.8 kg, SpO2 99 %.  Intake/Output  Summary (Last 24 hours) at 03/18/2018 1753 Last data filed at 03/18/2018 1600 Gross per 24 hour  Intake  1980.65 ml  Output 780 ml  Net 1200.65 ml   Filed Weights   03/16/18 0300 03/17/18 0200 03/18/18 0500  Weight: 79.6 kg 80 kg 80.8 kg    Examination: No exam by TRH today   CBC: Recent Labs  Lab 03/15/18 0602 03/16/18 0529 03/17/18 0749 03/18/18 0529  WBC 17.0* 17.7* 22.0* 19.3*  NEUTROABS 14.2*  --   --   --   HGB 9.8* 9.2* 10.8* 10.3*  HCT 32.0* 30.3* 35.4* 33.4*  MCV 84.0 84.4 86.3 85.6  PLT 301 266 268 397   Basic Metabolic Panel: Recent Labs  Lab 03/15/18 0602  03/17/18 0749 03/18/18 0529 03/18/18 1543  NA  --    < > 138 140 143  K  --    < > 4.4 4.3 4.1  CL  --    < > 109 113* 117*  CO2  --    < > 16* 16* 16*  GLUCOSE  --    < > 124* 154* 170*  BUN  --    < > 27* 35* 40*  CREATININE  --    < > 0.95 1.05* 1.15*  CALCIUM  --    < > 9.1 9.6 9.6  MG  --    < > 2.4 2.6* 2.6*  PHOS 3.1  --   --  3.3  --    < > = values in this interval not displayed.   GFR: Estimated Creatinine Clearance: 45 mL/min (A) (by C-G formula based on SCr of 1.15 mg/dL (H)).  Liver Function Tests: Recent Labs  Lab 03/12/18 0518  AST 68*  ALT 136*  ALKPHOS 435*  BILITOT 0.9  PROT 5.3*  ALBUMIN 2.0*    Coagulation Profile: Recent Labs  Lab 04/03/2018 2201  INR 1.35    HbA1C: Hgb A1c MFr Bld  Date/Time Value Ref Range Status  03/15/2018 06:02 AM 6.1 (H) 4.8 - 5.6 % Final    Comment:    (NOTE) Pre diabetes:          5.7%-6.4% Diabetes:              >6.4% Glycemic control for   <7.0% adults with diabetes   02/29/2016 05:01 AM 5.4 4.8 - 5.6 % Final    Comment:    (NOTE)         Pre-diabetes: 5.7 - 6.4         Diabetes: >6.4         Glycemic control for adults with diabetes: <7.0     CBG: Recent Labs  Lab 03/17/18 2321 03/18/18 0347 03/18/18 0744 03/18/18 1150 03/18/18 1531  GLUCAP 163* 142* 165* 169* 155*    Recent Results (from the past 240 hour(s))  MRSA PCR Screening     Status: None   Collection Time: 03/24/2018 11:45 PM  Result Value Ref Range  Status   MRSA by PCR NEGATIVE NEGATIVE Final    Comment:        The GeneXpert MRSA Assay (FDA approved for NASAL specimens only), is one component of a comprehensive MRSA colonization surveillance program. It is not intended to diagnose MRSA infection nor to guide or monitor treatment for MRSA infections. Performed at [facility], [place] [address] [place], [place] [address]   Culture, respiratory (non-expectorated)     Status: None   Collection Time: 03/15/18 11:48 AM  Result Value Ref Range Status   Specimen Description TRACHEAL ASPIRATE  Final   Special Requests NONE  Final   Gram Stain NO WBC SEEN NO ORGANISMS SEEN   Final   Culture   Final    FEW Consistent with normal respiratory flora. Performed at [facility], [place] [address], [place], [place] [address]    Report Status 03/17/2018 FINAL  Final     Scheduled Meds: .  stroke: mapping our early stages of recovery book   Does not apply Once  . arformoterol  15 mcg Nebulization BID  . aspirin  325 mg Oral Daily  . atorvastatin  20 mg Oral QHS  . budesonide (PULMICORT) nebulizer solution  0.25 mg Nebulization BID  . chlorhexidine gluconate (MEDLINE KIT)  15 mL Mouth Rinse BID  . ezetimibe  10 mg Oral Daily  . feeding supplement (PRO-STAT SUGAR FREE 64)  30 mL Per Tube Daily  . furosemide  40 mg Intravenous Q12H  . ipratropium-albuterol  3 mL Nebulization TID  . mouth rinse  15 mL Mouth Rinse Q4H  . multivitamin  1 tablet Oral BID  . pantoprazole (PROTONIX) IV  40 mg Intravenous Q24H  . sodium chloride HYPERTONIC  4 mL Nebulization TID     LOS: 10 days   Author:  Berle Mull, MD [facility] 03/18/2018  If 7PM-7AM, please contact night-coverage To reach On-call, see [email or web address]   If 7PM-7AM, please contact night-coverage per Amion 03/18/2018, 5:53 PM

## 2018-03-18 NOTE — Progress Notes (Signed)
RT NOTE:  Attempted Vest CPT. PT became very nauseous after only 2 minute of wearing it. Will attempt at a later time.

## 2018-03-18 NOTE — Progress Notes (Signed)
RT NOTE:  Vest CPT not performed during morning rounds. Pt refused and claimed that the therapy makes her nauseous. Discussed with pt the importance of CPT and she continued to refuse. Will attempt again at a later time. RN informed.

## 2018-03-18 NOTE — Progress Notes (Signed)
  Speech Language Pathology Treatment: Dysphagia  Patient Details Name: Kelly Benjamin MRN: [id number] DOB: 27-Sep-1940 Today's Date: 03/18/2018 Time: 4970-2637 SLP Time Calculation (min) (ACUTE ONLY): 15 min  Assessment / Plan / Recommendation Clinical Impression  Pt continues with significant left neglect, right gaze preference, and poor sustained attention.  Oral care provided, removing debris from tongue and soft palate.  Pt is a mouth breather and continues to have difficulty managing secretions. Provided with ice chips - pt with no sensory awareness of presence of ice in left oral cavity; masticated and swallowed when placed on right side.  Continues to demonstrate wet, consistent cough after 100% of trials.  She required frequent suctioning in an effort to remove secretions.  Cough is weak and pt has difficulty mobilizing them. She is not ready for POs nor instrumental swallow study.  D/W family.  Continue NPO.  SLP will follow for PO readiness, cognitive-communication.   HPI HPI: 78 year old with ischemic cardiomyopathy admitted with pneumonia, non-ST elevation MI, symptomatic anemia, atrial fibrillation.  Developed acute ischemic stroke status post IR thrombectomy. Intubated from 1/10 to 1/12. Remains hemi-paretic on the left side, weaning.  MRI of brain: Showing a large right MCA territory infarct involving the basal ganglia there are petechial hemorrhage throughout the infarct area without parenchymal hemorrhage.  There is partial effacement of the right lateral ventricle with 6 mm right to left shift.        SLP Plan  Continue with current plan of care       Recommendations  Diet recommendations: NPO Medication Administration: Via alternative means                Oral Care Recommendations: Oral care QID SLP Visit Diagnosis: Dysphagia, unspecified (R13.10) Plan: Continue with current plan of care       GO               Kelly Benjamin L. Tivis Ringer, [place] CCC/SLP Acute  Rehabilitation Services Office number [phone] Pager [phone]  Kelly Benjamin 03/18/2018, 4:16 PM

## 2018-03-18 NOTE — Progress Notes (Signed)
STROKE TEAM PROGRESS NOTE   SUBJECTIVE (INTERVAL HISTORY) Her RN and Drs. Mannam and Posey Pronto are at bedside.  She still has A. fib RVR intermittently, BP on the soft side. Still has SOB and as per RN copious thick secretion was suctioned out over night. Discussed with CCM and Dr. Posey Pronto, will continue TF and IVF, but will give lasix and continue chest PT. Pt refused chest PT earlier today.    OBJECTIVE Vitals:   03/18/18 0758 03/18/18 0800 03/18/18 0910 03/18/18 1000  BP:  121/64 (!) 124/96 107/72  Pulse:  85 86 (!) 102  Resp:      Temp:  97.7 F (36.5 C)    TempSrc:  Axillary    SpO2: 98% 100% 97% 94%  Weight:      Height:        CBC:  Recent Labs  Lab 03/15/18 0602  03/17/18 0749 03/18/18 0529  WBC 17.0*   < > 22.0* 19.3*  NEUTROABS 14.2*  --   --   --   HGB 9.8*   < > 10.8* 10.3*  HCT 32.0*   < > 35.4* 33.4*  MCV 84.0   < > 86.3 85.6  PLT 301   < > 268 312   < > = values in this interval not displayed.    Basic Metabolic Panel:  Recent Labs  Lab 03/15/18 0602  03/17/18 0749 03/18/18 0529  NA  --    < > 138 140  K  --    < > 4.4 4.3  CL  --    < > 109 113*  CO2  --    < > 16* 16*  GLUCOSE  --    < > 124* 154*  BUN  --    < > 27* 35*  CREATININE  --    < > 0.95 1.05*  CALCIUM  --    < > 9.1 9.6  MG  --    < > 2.4 2.6*  PHOS 3.1  --   --  3.3   < > = values in this interval not displayed.    Lipid Panel:     Component Value Date/Time   CHOL 71 03/15/2018 0602   CHOL 112 12/07/2016 0812   TRIG 64 03/15/2018 0602   HDL 26 (L) 03/15/2018 0602   HDL 32 (L) 12/07/2016 0812   CHOLHDL 2.7 03/15/2018 0602   VLDL 13 03/15/2018 0602   LDLCALC 32 03/15/2018 0602   LDLCALC 59 12/07/2016 0812   HgbA1c:  Lab Results  Component Value Date   HGBA1C 6.1 (H) 03/15/2018   Urine Drug Screen: No results found for: LABOPIA, COCAINSCRNUR, LABBENZ, AMPHETMU, THCU, LABBARB  Alcohol Level No results found for: ETH  IMAGING  Ct Angio Head W Or Wo Contrast Ct Angio Neck  W Or Wo Contrast 03/10/2018 IMPRESSION:  1. Emergent large vessel occlusion involving the proximal right M1 segment.  2. Very poor collateral flow to the right MCA distribution.  3. 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  4. Opacification of the left vertebral artery above the V1 segment may be retrograde flow.  5. Mild diffuse small vessel disease within the ACA branch vessels, left MCA branch vessels, and posterior circulation.  6. Extensive atherosclerotic calcification and vessel wall irregularity at the carotid bifurcations bilaterally without significant stenosis.  7. Extensive atherosclerotic changes within the cavernous internal carotid arteries bilaterally without significant stenosis.  8. Atherosclerotic changes at the aortic arch and  particularly the left subclavian artery without significant stenosis.    Dg Chest Port 1 View 03/15/2018 IMPRESSION:  Appliances appear in satisfactory location. Cardiac enlargement. Left perihilar mass or consolidation with consolidation or atelectasis in the left lower lung, unchanged.    Ct Head Code Stroke Wo Contrast 03/12/2018 IMPRESSION:  1. Acute right MCA territory infarct with decreased density in the right lentiform nucleus, right insular cortex, and right M1 and M2 cortex.  2. Hyperdense right MCA suggesting acute thrombus.  3. ASPECTS is 6/10    Transthoracic Echocardiogram  03/10/2017 Study Conclusions - Left ventricle: The cavity size was normal. Wall thickness was   increased in a pattern of mild LVH. Systolic function was   severely reduced. The estimated ejection fraction was in the   range of 15% to 20%. Diffuse hypokinesis. Features are consistent   with a pseudonormal left ventricular filling pattern, with   concomitant abnormal relaxation and increased filling pressure   (grade 2 diastolic dysfunction). Doppler parameters are   consistent with high ventricular filling pressure. -  Aortic valve: Trileaflet; mildly thickened, mildly calcified   leaflets. - Mitral valve: There was moderate regurgitation. - Left atrium: The atrium was severely dilated. - Right ventricle: Systolic function was moderately reduced. - Right atrium: The atrium was severely dilated. - Tricuspid valve: There was moderate regurgitation. - Pulmonary arteries: Systolic pressure was mildly increased. PA   peak pressure: 41 mm Hg (S). Impressions: - EF has reduced from prior study (40-45%)   [name] Wo Contrast  Result Date: 03/15/2018 CLINICAL DATA:  Status post revascularization of right M1 occlusion. Right MCA infarct. EXAM: MRI HEAD WITHOUT CONTRAST MRA HEAD WITHOUT CONTRAST TECHNIQUE: Multiplanar, multiecho pulse sequences of the brain and surrounding structures were obtained without intravenous contrast. Angiographic images of the head were obtained using MRA technique without contrast. COMPARISON:  CTA head and neck 03/14/2017. FINDINGS: MRI HEAD FINDINGS Brain: Diffusion-weighted images demonstrate a large right MCA territory infarct involving the right frontal and parietal lobe. There is involving the superior temporal lobe and insular cortex. Right lentiform nucleus and caudate are involved. T2 and FLAIR sequences demonstrate diffuse edema. There is petechial hemorrhage throughout the infarct territory. Petechial hemorrhage is noted in the right basal ganglia as well. No parenchymal hemorrhage is present. There is mass effect with edema in the cortex. There is partial effacement of the right lateral ventricle. Midline shift measures 6 mm at the foramen of Monro. No new infarct is present. Moderate diffuse white matter disease is present in the left hemisphere. Brainstem is within normal limits. Remote lacunar infarcts are again noted in the cerebellum bilaterally. The internal auditory canals are within normal limits. Vascular: Flow is present in the major intracranial arteries. Skull and upper  cervical spine: Fluid in the nasopharynx is likely related to intubation. Exaggerated cervical lordosis is again noted. Sinuses/Orbits: Mild mucosal thickening is present throughout the ethmoid air cells. There is minimal mucosal thickening in the right maxillary sinus. Small mastoid effusions are present. Bilateral lens replacements are noted. Globes and orbits are otherwise unremarkable. MRA HEAD FINDINGS The internal carotid arteries are patent bilaterally. Mild atherosclerotic changes are noted in the cavernous segments. Flow is maintained within the right ICA, A1, and M1 segments. Left A1 and M1 segments are normal. ACA and MCA branch vessels are normal bilaterally. The vertebrobasilar junction is below the field of view. Basilar artery is normal. AICA vessels are normal. Both posterior cerebral arteries originate from basilar tip. A left posterior  communicating artery is present. PCA branch vessels are normal. IMPRESSION: 1. 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 2. Petechial hemorrhage throughout the infarct without parenchymal hemorrhage. 3. Mass effect with partial effacement of the right lateral ventricle and 6 mm of right-to-left midline shift. 4. Left cerebral hemisphere white matter disease consistent with underlying microvascular ischemia. 5. MRA demonstrates persistent patent flow within the right ICA and MCA. Electronically Signed   By: San Morelle M.D.   On: 03/15/2018 16:12   Mr Brain Wo Contrast  Result Date: 03/15/2018 CLINICAL DATA:  Status post revascularization of right M1 occlusion. Right MCA infarct. EXAM: MRI HEAD WITHOUT CONTRAST MRA HEAD WITHOUT CONTRAST TECHNIQUE: Multiplanar, multiecho pulse sequences of the brain and surrounding structures were obtained without intravenous contrast. Angiographic images of the head were obtained using MRA technique without contrast. COMPARISON:  CTA head and neck 03/14/2017. FINDINGS: MRI HEAD FINDINGS Brain:  Diffusion-weighted images demonstrate a large right MCA territory infarct involving the right frontal and parietal lobe. There is involving the superior temporal lobe and insular cortex. Right lentiform nucleus and caudate are involved. T2 and FLAIR sequences demonstrate diffuse edema. There is petechial hemorrhage throughout the infarct territory. Petechial hemorrhage is noted in the right basal ganglia as well. No parenchymal hemorrhage is present. There is mass effect with edema in the cortex. There is partial effacement of the right lateral ventricle. Midline shift measures 6 mm at the foramen of Monro. No new infarct is present. Moderate diffuse white matter disease is present in the left hemisphere. Brainstem is within normal limits. Remote lacunar infarcts are again noted in the cerebellum bilaterally. The internal auditory canals are within normal limits. Vascular: Flow is present in the major intracranial arteries. Skull and upper cervical spine: Fluid in the nasopharynx is likely related to intubation. Exaggerated cervical lordosis is again noted. Sinuses/Orbits: Mild mucosal thickening is present throughout the ethmoid air cells. There is minimal mucosal thickening in the right maxillary sinus. Small mastoid effusions are present. Bilateral lens replacements are noted. Globes and orbits are otherwise unremarkable. MRA HEAD FINDINGS The internal carotid arteries are patent bilaterally. Mild atherosclerotic changes are noted in the cavernous segments. Flow is maintained within the right ICA, A1, and M1 segments. Left A1 and M1 segments are normal. ACA and MCA branch vessels are normal bilaterally. The vertebrobasilar junction is below the field of view. Basilar artery is normal. AICA vessels are normal. Both posterior cerebral arteries originate from basilar tip. A left posterior communicating artery is present. PCA branch vessels are normal. IMPRESSION: 1. Large MCA territory infarct involves the basal  ganglia and cortex. 2. Petechial hemorrhage throughout the infarct without parenchymal hemorrhage. 3. Mass effect with partial effacement of the right lateral ventricle and 6 mm of right-to-left midline shift. 4. Left cerebral hemisphere white matter disease consistent with underlying microvascular ischemia. 5. MRA demonstrates persistent patent flow within the right ICA and MCA. Electronically Signed   By: San Morelle M.D.   On: 03/15/2018 16:12     PHYSICAL EXAM  Temp:  [97.5 F (36.4 C)-97.7 F (36.5 C)] 97.7 F (36.5 C) (01/14 0800) Pulse Rate:  [80-133] 102 (01/14 1000) Resp:  [25] 25 (01/14 0755) BP: (87-129)/(58-96) 107/72 (01/14 1000) SpO2:  [94 %-100 %] 94 % (01/14 1000) Weight:  [80.8 kg] 80.8 kg (01/14 0500)  General - Well nourished, well developed, in mild respiratory distress.  Ophthalmologic - fundi not visualized due to noncooperation.  Cardiovascular - irregularly irregular heart rate  and rhythm, telemetry showed A. fib RVR.  Neuro - mild respiratory distress, awake alert, orientated to self, age, place and time.  Able to following all simple commands. Right gaze preference but barely cross midline, PERRL, blinking to visual threat on the right but not on the left. Mild left facial asymmetry. Tongue midline. RUE 4/5 and RLE 3/5, but left LE 2/5 proximally on pain but able to wiggle toes on command on the left, left upper extremity 0/5. DTR 1+ and left babinski positive. Sensation symmetrical, coordination not cooperative and gait not tested.   ASSESSMENT/PLAN Kelly Benjamin is a 78 y.o. female with history of coronary artery disease, ischemic cardiomyopathy with EF in the past to 40%, iron deficiency anemia as a child  presenting with CHF, elevated troponin, dysarthria and Lt sided weakness.  She did not receive IV t-PA due to recent GI bleeding and severe anemia and late presentation.  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 fib and cardiomyopathy with low EF  CT head - Acute right MCA territory infarct with decreased density in the right lentiform nucleus, right insular cortex, and right M1 and M2 cortex. Hyperdense right MCA suggesting acute thrombus.   MRI head right MCA large infarct with diffuse petechial hemorrhage  MRA head right MCA patent with luxury perfusion  CTA H&N - proximal right M1 LVO. High-grade stenosis, possibly occlusion of the dominant left vertebral artery.   2D Echo  - EF 15-20% decrease from prior 40 to 45%. No cardiac source of emboli identified.   LDL - 32  HgbA1c - 6.1  VTE prophylaxis - SCDs  Diet - NPO  aspirin 81 mg daily and clopidogrel 75 mg daily prior to admission, now on aspirin 325 mg daily. Will consider DOAC in 7-10 days (03/22/18) post stroke given size of infarct and hemorrhagic conversion  Patient counseled to be compliant with her antithrombotic medications  Ongoing aggressive stroke risk factor management  Therapy recommendations:  CIR  Disposition:  Pending  Cerebral edema  MRI showed large right MCA infarct but spare most BG and CR.   MLS about 87mm  MRA showed right MCA luxury reperfusion  Clinically stable at this time  No need 3% at this time  Sodium 135-138-140  New onset Afib  In the setting of pneumonia, anemia and non-STEMI  Currently intermittent A. fib RVR  On IV metoprolol 5 mg as needed  May consider po metoprolol once BP more stable  We will consider DOAC 7-10 days post stroke (03/22/18) given the size of infarct  Cardiomyopathy with low EF  2D echo showed EF 15 to 20% down from prior 40 to 45%  IM Dr. Posey Pronto on board  Avoid fluid overload  Consider lasix one dose  CXR Masslike opacity in the left mid lung consistent with pneumonia   Non-STEMI  Cardiology on board  On aspirin  We will consider DOAC at day 7 post stroke  Respiratory failure  Intubated for  procedure  CCM and IM on board  Extubated, however still has SOB  Copious secretions  3% saline nebulization  Chest PT  NT suctioning as needed  Kept in ICU for another 24h at least  Hypotension History of hypertension  BP runs low but stable  Off Levophed  . BP goal 110-140 post procedure . continue NS @ 50 . Diuresis if needed  Hyperlipidemia  Lipid lowering medication  PTA:  Lipitor 40 mg daily and Zetia 10 mg daily  LDL 32, goal < 70  Current lipid lowering medication: Lipitor 20 mg daily and Zetia 10 mg daily  Continue statin at discharge  Recent pneumonia  CXR showed Masslike opacity in the left mid lung. Unchanged consolidation or atelectasis in the left lower lung  Leukocytosis 17.0->17.7-> 22.0->19.3  Off cefepime today   CCM on board  Fever Tmax 100.8-> afebrile  Hyponatremia  Sodium 127-133->135-> 138->140  Continue monitoring  Continue NS @ 50  Severe anemia, improving  Hemoglobin 5.6-PRBC-9.8->9.2-> 10.8->10.3  GI on board plan for endoscopy once patient stable  CBC monitoring  Dysphagia  Did not pass swallow  Speech following  Had core track placed 03/17/18  On TF @ 50  Other Stroke Risk Factors  Advanced age  Former cigarette smoker - quit  ETOH use, advised to drink no more than 1 alcoholic beverage per day.  Coronary artery disease  Other Active Problems  Elevated creatinine 1.57-1.28-0.88->0.97-> 0.95->1.05   Hospital day # 10  This patient is critically ill due to right MCA stroke, A. fib RVR, cardiomyopathy with low EF, pneumonia, non-STEMI, anemia need blood transfusion, new onset A. fib and at significant risk of neurological worsening, death form recurrent stroke, hemorrhagic conversion, seizure, heart failure, cardiogenic shock. This patient's care requires constant monitoring of vital signs, hemodynamics, respiratory and cardiac monitoring, review of multiple databases, neurological assessment,  discussion with family, other specialists and medical decision making of high complexity. I spent 35 minutes of neurocritical care time in the care of this patient.  I discussed with Dr. Vaughan Browner in CCM and Dr. Posey Pronto from IM.  Neurology will follow peripherally. Please call with questions. Pt will follow up with stroke clinic NP at [facility] in about 4 weeks after discharge.    Rosalin Hawking, MD PhD Stroke Neurology 03/18/2018 10:41 AM     To contact Stroke Continuity provider, please refer to [email or web address] After hours, contact General Neurology

## 2018-03-18 NOTE — Progress Notes (Addendum)
Progress Note  Patient Name: Kelly Benjamin Date of Encounter: 03/18/2018  Primary Cardiologist: Quay Burow, MD   Subjective   No significant overnight events. Patient more alert this morning. No chest pain.  Inpatient Medications    Scheduled Meds: .  stroke: mapping our early stages of recovery book   Does not apply Once  . arformoterol  15 mcg Nebulization BID  . aspirin  325 mg Oral Daily  . atorvastatin  20 mg Oral QHS  . budesonide (PULMICORT) nebulizer solution  0.25 mg Nebulization BID  . chlorhexidine gluconate (MEDLINE KIT)  15 mL Mouth Rinse BID  . ezetimibe  10 mg Oral Daily  . feeding supplement (PRO-STAT SUGAR FREE 64)  30 mL Per Tube Daily  . furosemide  40 mg Intravenous Q12H  . ipratropium-albuterol  3 mL Nebulization TID  . mouth rinse  15 mL Mouth Rinse Q4H  . multivitamin  1 tablet Oral BID  . pantoprazole (PROTONIX) IV  40 mg Intravenous Q24H  . sodium chloride HYPERTONIC  4 mL Nebulization TID   Continuous Infusions: . feeding supplement (JEVITY 1.2 CAL) 50 mL/hr at 03/18/18 0500   PRN Meds: acetaminophen **OR** acetaminophen (TYLENOL) oral liquid 160 mg/5 mL **OR** acetaminophen, bisacodyl, docusate, metoprolol tartrate, ondansetron (ZOFRAN) IV   Vital Signs    Vitals:   03/18/18 0800 03/18/18 0910 03/18/18 1000 03/18/18 1125  BP: 121/64 (!) 124/96 107/72 102/70  Pulse: 85 86 (!) 102 (!) 117  Resp:    (!) 25  Temp: 97.7 F (36.5 C)     TempSrc: Axillary     SpO2: 100% 97% 94% 98%  Weight:      Height:        Intake/Output Summary (Last 24 hours) at 03/18/2018 1224 Last data filed at 03/18/2018 1000 Gross per 24 hour  Intake 1842.9 ml  Output 380 ml  Net 1462.9 ml   Filed Weights   03/16/18 0300 03/17/18 0200 03/18/18 0500  Weight: 79.6 kg 80 kg 80.8 kg    Telemetry    Atrial fibrillation with ventricular rates currently in the 80's to 110's. Brief episodes in the 120's to 130's this morning. A few episodes of non-sustained  VT up to 4 beats. - Personally Reviewed  Physical Exam   GEN: Elderly, frail, and ill-appearing Caucasian female. No acute distress. Neck: Supple. Cardiac: Borderline tachycardic with irregularly irregular rhythm. No murmurs, rubs, or gallops.  Respiratory: Tachypneic with mild increased work of breathing. Course breath sounds throughout. GI: Abdomen soft, non-distended, and non-tender to palpation. Bowel sounds present. MS: Trace to 1+ edema of bilateral lower extremities. Edema of left upper extremity. Skin: Warm and dry. Neuro:  Left hemiparesis. Psych: Normal Affect.  Labs    Chemistry Recent Labs  Lab 03/12/18 0518  03/16/18 0529 03/17/18 0749 03/18/18 0529  NA  --    < > 135 138 140  K  --    < > 4.0 4.4 4.3  CL  --    < > 110 109 113*  CO2  --    < > 15* 16* 16*  GLUCOSE  --    < > 124* 124* 154*  BUN  --    < > 30* 27* 35*  CREATININE  --    < > 0.97 0.95 1.05*  CALCIUM  --    < > 8.3* 9.1 9.6  PROT 5.3*  --   --   --   --   ALBUMIN 2.0*  --   --   --   --  AST 68*  --   --   --   --   ALT 136*  --   --   --   --   ALKPHOS 435*  --   --   --   --   BILITOT 0.9  --   --   --   --   GFRNONAA  --    < > 57* 58* 52*  GFRAA  --    < > >60 >60 60*  ANIONGAP  --    < > '10 13 11   '$ < > = values in this interval not displayed.     Hematology Recent Labs  Lab 03/16/18 0529 03/17/18 0749 03/18/18 0529  WBC 17.7* 22.0* 19.3*  RBC 3.59* 4.10 3.90  HGB 9.2* 10.8* 10.3*  HCT 30.3* 35.4* 33.4*  MCV 84.4 86.3 85.6  MCH 25.6* 26.3 26.4  MCHC 30.4 30.5 30.8  RDW 19.0* 19.9* 20.3*  PLT 266 268 312    Cardiac Enzymes No results for input(s): TROPONINI in the last 168 hours.  No results for input(s): TROPIPOC in the last 168 hours.   BNP No results for input(s): BNP, PROBNP in the last 168 hours.   DDimer No results for input(s): DDIMER in the last 168 hours.   Radiology    Dg Chest Port 1 View  Result Date: 03/17/2018 CLINICAL DATA:  Follow-up pneumonia  EXAM: PORTABLE CHEST 1 VIEW COMPARISON:  03/15/2018 FINDINGS: Cardiac shadow remains enlarged. Aortic calcifications are again seen. The endotracheal tube and nasogastric catheter have been removed in the interval. Persistent masslike density in the left mid lung is seen. Small effusions are noted bilaterally left greater than right. No other focal abnormality is seen. IMPRESSION: Persistent masslike density in the left mid lung. Bilateral small effusions. Electronically Signed   By: Inez Catalina M.D.   On: 03/17/2018 07:11    Cardiac Studies   Echocardiogram 06/20/2016: Study Conclusions: - Left ventricle: The cavity size was normal. There was mild   concentric hypertrophy. Systolic function was mildly to   moderately reduced. The estimated ejection fraction was in the   range of 40% to 45%. Hypokinesis of the anterior and   anterolateral with akinesis of the inferolateral myocardium.   Doppler parameters are consistent with abnormal left ventricular   relaxation (grade 1 diastolic dysfunction). Doppler parameters   are consistent with indeterminate ventricular filling pressure. - Aortic valve: Transvalvular velocity was within the normal range.   There was no stenosis. There was no regurgitation. - Mitral valve: Transvalvular velocity was within the normal range.   There was no evidence for stenosis. There was mild regurgitation. - Right ventricle: Systolic function was normal. - Atrial septum: No defect or patent foramen ovale was identified   by color flow Doppler. - Tricuspid valve: There was trivial regurgitation. - Pulmonary arteries: Systolic pressure was within the normal   range. PA peak pressure: 21 mm Hg (S). _______________  Left Heart Catheterization 03/02/2016:  Prox RCA to Mid RCA lesion, 90 %stenosed.  Dist LAD lesion, 85 %stenosed.    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 The right coronary is a very  large territory and also has left right collaterals from the left coronary system. The likely scenario is the patient's inferolateral infarction occurred due to thrombotic occlusion and she subsequently had on pain is recanalization.  Widely patent circumflex.  Diffuse 85-90% apical LAD disease.  Elevated left ventricular end-diastolic pressure. Previously  documented LVEF in the 30-35% range by noninvasive imaging.  Recommendations:  In absence of angina or ischemia on nuclear scintigraphy, medical therapy would appear to be the treatment of choice.  LV dysfunction is likely secondary to chronic remodeling following inferolateral infarction which occurred remotely.  Guideline mandated heart failure therapy. Discontinuation amlodipine will allow further up titration of heart failure agents.  Patient Profile   Ms. Pak is a 78 y.o. female with a history of of CAD, ischemic cardiomyopathy with EF of 40-45% on Echo in 06/2016, hyperlipidemia, COPD, and left breast cancer s/p radiation and lumpectomy who presented to the [facility] ED on 03/08/2017 for evaluation of worsening shortness of breath and edema. On arrival, she was found to have profound anemia with a hemoglobin of 5.6. Chest x-ray consistent with left upper lobe pneumonia. Troponin also elevated. Cardiology was consulted for acute on chronic systolic CHF and NSTEMI.  Assessment & Plan    Acute Hypoxemic Respiratory Failure  - Extubated 03/16/2018. - Per primary team.  Acute Combined Systolic and Diastolic CHF  - Cardiomyopathy may be ischemic with known CAD and newly reduced EF. - BNP elevated at 3,356.1. - Echo showed LVEF of 15-20% (down from 40-45% in 06/2016) with diffuse hypokinesis and grade 2 diastolic dysfunction.   - Patient received some IV fluids this morning at 73m/hr. Patient also restarted on IV Lasix '40mg'$  twice daily today by primary team. - No ACEi/ARB due to soft BP. BP seems to be improving some. Consider  starting low dose when able. - Currently on IV Metoprolol as needed. Unable to take PO.  New Onset Atrial Fibrillation  - Telemetry shows atrial fibrillation with ventricular rates currently in the 80's to 110's. A few episodes of non-sustained VT up to 4 beats also noted. - Magneisum 2.6 today. At goal of > 2.0. - Potassium 4.3 today. At goal of > 4.0.  - Continue IV Lopressor PRN. Last dose was yesterday morning. If episodes of non-sustained VT continue, may consider scheduled doses.  - Per Neurology, consider starting DOAC 7 days post stroke. Will defer to MD and Neurology.  Acute Embolic Stroke and Cerebral Edema - Felt to be due to new onset atrial fibrillation and cardiomyopathy with reduced EF. - Patient failed swallow study yesterday.  - Per Neurology, consider starting DOAC 7 days post stroke given size of infarct and hemorrhagic conversion. - Management per Neurology and primary team.   Elevated Troponin with Known CAD - Troponin peaked at 1.35. Rise and fall of troponin possibly due to demand ischemia in the setting of severe anemia with underlying CAD and systolic CHF. - Continue Lipitor and Zetia. - No coronary angiogram anticipated during this hospital stay. Follow-up as outpatient.  Acute Anemia of Unknown Etiology - Hemoglobin 5.6 on admission (11.3 in 02/2016).  - Patient received multiple blood transfusions with last one being on 1/10. - Hemoglobin stable at 10.3 today. - Management per primary team.  For questions or updates, please contact [place] consult [email or web address] for contact info under        Signed, CDarreld Mclean PA-C  03/18/2018, 12:24 PM    Personally seen and examined. Agree with above.  Feeling a little better.  No chest pain.  Physical exam: Frail, elderly with irregularly irregular rhythm, now less than 100 no murmurs with mildly increased work of breathing trace edema left hemiparesis, no changes  Laboratory  reviewed Telemetry reviewed personally-atrial fibrillation as fast as 140 earlier this morning.  78year old with  known CAD, ischemic cardiomyopathy EF 40, now 15-20% with profound anemia hemoglobin 5.6 on admission, left upper lobe pneumonia, acute on chronic systolic heart failure.  Acute hypoxemic respiratory failure - Extubated, slowly progressing.  Antibiotics. No changes  Chronic systolic heart failure - EF has decreased, likely a result of recent severe anemia, stroke with concomitant paroxysmal atrial fibrillation. - Unable to utilize ACE inhibitor because of low BP.   -Utilizing low-dose beta-blocker at this time.  - try to avoid IVF. Agree with IV lasix  New onset atrial fibrillation paroxysmal - rate improved 80-110  Unable to take p.o.  IV metoprolol helped.  Agree with metoprolol 5 mg IV every 6 hours to keep heart rate less than 110. - Consider starting Eliquis 7 to 14 days post stroke if okay with neurology  Elevated troponin peak 1.35 - Rise and fall of troponin most likely secondary to demand ischemia in the setting of severe anemia with underlying coronary artery disease and stroke and chronic systolic heart failure.  Not acute coronary syndrome.  I do not think this is a typical non-ST elevation myocardial infarction.  Nonetheless, this does increase her cardiovascular mortality/morbidity.  No further invasive cardiac evaluation.  Severe anemia of 5.6 on admission -Currently stable.  GI. Hgb stable -Aspirin has been started.  Complex medical decision making.  Candee Furbish, MD

## 2018-03-19 ENCOUNTER — Inpatient Hospital Stay (HOSPITAL_COMMUNITY): Payer: Medicare PPO

## 2018-03-19 LAB — GLUCOSE, CAPILLARY
GLUCOSE-CAPILLARY: 130 mg/dL — AB (ref 70–99)
Glucose-Capillary: 149 mg/dL — ABNORMAL HIGH (ref 70–99)
Glucose-Capillary: 114 mg/dL — ABNORMAL HIGH (ref 70–99)
Glucose-Capillary: 126 mg/dL — ABNORMAL HIGH (ref 70–99)
Glucose-Capillary: 140 mg/dL — ABNORMAL HIGH (ref 70–99)
Glucose-Capillary: 161 mg/dL — ABNORMAL HIGH (ref 70–99)

## 2018-03-19 LAB — POCT I-STAT 3, ART BLOOD GAS (G3+)
Acid-base deficit: 6 mmol/L — ABNORMAL HIGH (ref 0.0–2.0)
Bicarbonate: 17.6 mmol/L — ABNORMAL LOW (ref 20.0–28.0)
O2 Saturation: 99 %
Patient temperature: 97.1
TCO2: 18 mmol/L — AB (ref 22–32)
pCO2 arterial: 26.3 mmHg — ABNORMAL LOW (ref 32.0–48.0)
pH, Arterial: 7.43 (ref 7.350–7.450)
pO2, Arterial: 128 mmHg — ABNORMAL HIGH (ref 83.0–108.0)

## 2018-03-19 LAB — BASIC METABOLIC PANEL
Anion gap: 14 (ref 5–15)
BUN: 43 mg/dL — ABNORMAL HIGH (ref 8–23)
CALCIUM: 9.5 mg/dL (ref 8.9–10.3)
CO2: 18 mmol/L — ABNORMAL LOW (ref 22–32)
Chloride: 112 mmol/L — ABNORMAL HIGH (ref 98–111)
Creatinine, Ser: 1.18 mg/dL — ABNORMAL HIGH (ref 0.44–1.00)
GFR, EST AFRICAN AMERICAN: 52 mL/min — AB (ref >60)
GFR, EST NON AFRICAN AMERICAN: 45 mL/min — AB (ref >60)
Glucose, Bld: 150 mg/dL — ABNORMAL HIGH (ref 70–99)
Potassium: 4.5 mmol/L (ref 3.5–5.1)
Sodium: 144 mmol/L (ref 135–145)

## 2018-03-19 LAB — CBC
HCT: 32.1 % — ABNORMAL LOW (ref 36.0–46.0)
Hemoglobin: 9.4 g/dL — ABNORMAL LOW (ref 12.0–15.0)
MCH: 25.3 pg — ABNORMAL LOW (ref 26.0–34.0)
MCHC: 29.3 g/dL — ABNORMAL LOW (ref 30.0–36.0)
MCV: 86.5 fL (ref 80.0–100.0)
Platelets: 301 10*3/uL (ref 150–400)
RBC: 3.71 MIL/uL — ABNORMAL LOW (ref 3.87–5.11)
RDW: 20.3 % — AB (ref 11.5–15.5)
WBC: 16.1 10*3/uL — ABNORMAL HIGH (ref 4.0–10.5)
nRBC: 0.2 % (ref 0.0–0.2)

## 2018-03-19 LAB — PHOSPHORUS: Phosphorus: 2.9 mg/dL (ref 2.5–4.6)

## 2018-03-19 LAB — MAGNESIUM: Magnesium: 2.4 mg/dL (ref 1.7–2.4)

## 2018-03-19 MED ORDER — FUROSEMIDE 10 MG/ML IJ SOLN
40.0000 mg | Freq: Two times a day (BID) | INTRAMUSCULAR | Status: AC
  Administered 2018-03-19 (×2): 40 mg via INTRAVENOUS
  Filled 2018-03-19 (×2): qty 4

## 2018-03-19 MED ORDER — MORPHINE SULFATE (PF) 2 MG/ML IV SOLN
1.0000 mg | INTRAVENOUS | Status: DC | PRN
  Administered 2018-03-19 – 2018-03-20 (×3): 1 mg via INTRAVENOUS
  Filled 2018-03-19 (×3): qty 1

## 2018-03-19 NOTE — Progress Notes (Addendum)
Progress Note  Patient Name: Kelly Benjamin Date of Encounter: 03/19/2018  Primary Cardiologist: Quay Burow, MD   Subjective   No significant overnight events. Patient denies any chest pain this morning.   Inpatient Medications    Scheduled Meds: .  stroke: mapping our early stages of recovery book   Does not apply Once  . arformoterol  15 mcg Nebulization BID  . aspirin  325 mg Oral Daily  . atorvastatin  20 mg Oral QHS  . budesonide (PULMICORT) nebulizer solution  0.25 mg Nebulization BID  . chlorhexidine gluconate (MEDLINE KIT)  15 mL Mouth Rinse BID  . ezetimibe  10 mg Oral Daily  . feeding supplement (PRO-STAT SUGAR FREE 64)  30 mL Per Tube Daily  . furosemide  40 mg Intravenous Q12H  . ipratropium-albuterol  3 mL Nebulization TID  . mouth rinse  15 mL Mouth Rinse Q4H  . multivitamin  1 tablet Oral BID  . pantoprazole (PROTONIX) IV  40 mg Intravenous Q24H  . sodium chloride HYPERTONIC  4 mL Nebulization TID   Continuous Infusions: . feeding supplement (JEVITY 1.2 CAL) Stopped (03/19/18 0200)   PRN Meds: acetaminophen **OR** acetaminophen (TYLENOL) oral liquid 160 mg/5 mL **OR** acetaminophen, bisacodyl, docusate, metoprolol tartrate, morphine injection, ondansetron (ZOFRAN) IV   Vital Signs    Vitals:   03/19/18 0754 03/19/18 0800 03/19/18 0900 03/19/18 1152  BP:  107/64 127/79 100/65  Pulse:  91  93  Resp:  (!) 24 (!) 26 (!) 23  Temp:  97.8 F (36.6 C)    TempSrc:  Axillary    SpO2: 96% 98%    Weight:      Height:        Intake/Output Summary (Last 24 hours) at 03/19/2018 1230 Last data filed at 03/19/2018 0345 Gross per 24 hour  Intake 710 ml  Output 1310 ml  Net -600 ml   Filed Weights   03/17/18 0200 03/18/18 0500 03/19/18 0500  Weight: 80 kg 80.8 kg 79.8 kg    Telemetry    Atrial fibrillation with ventricular rates currently in the 80's to 120's. An episode of non-sustained VT up to 6 beats also noted. - Personally Reviewed  Physical  Exam   GEN: Elderly, frail, and ill-appearing Caucasian female. No acute distress. Neck: Supple. Cardiac: Irregularly irregular rhythm with normal rate. No murmurs, rubs, or gallops.  Respiratory: Tachypneic with mild increased work of breathing. Crackles noted in bilateral bases, some improvement compared to yesterday. GI: Abdomen soft, non-distended, and non-tender to palpation. Bowel sounds present. MS: Trace to 1-2+ edema of bilateral lower extremities. Edema of left upper extremity. Skin: Warm and dry. Neuro:  Left hemiparesis. Psych: Normal Affect.  Labs    Chemistry Recent Labs  Lab 03/18/18 0529 03/18/18 1543 03/19/18 0349  NA 140 143 144  K 4.3 4.1 4.5  CL 113* 117* 112*  CO2 16* 16* 18*  GLUCOSE 154* 170* 150*  BUN 35* 40* 43*  CREATININE 1.05* 1.15* 1.18*  CALCIUM 9.6 9.6 9.5  GFRNONAA 52* 46* 45*  GFRAA 60* 54* 52*  ANIONGAP '11 10 14     '$ Hematology Recent Labs  Lab 03/17/18 0749 03/18/18 0529 03/19/18 0349  WBC 22.0* 19.3* 16.1*  RBC 4.10 3.90 3.71*  HGB 10.8* 10.3* 9.4*  HCT 35.4* 33.4* 32.1*  MCV 86.3 85.6 86.5  MCH 26.3 26.4 25.3*  MCHC 30.5 30.8 29.3*  RDW 19.9* 20.3* 20.3*  PLT 268 312 301    Cardiac Enzymes No results  for input(s): TROPONINI in the last 168 hours.  No results for input(s): TROPIPOC in the last 168 hours.   BNP No results for input(s): BNP, PROBNP in the last 168 hours.   DDimer No results for input(s): DDIMER in the last 168 hours.   Radiology    Dg Chest Port 1 View  Result Date: 03/19/2018 CLINICAL DATA:  Pneumonia EXAM: PORTABLE CHEST 1 VIEW COMPARISON:  03/17/2018 FINDINGS: Consolidation throughout the left lung has worsened. Hazy opacity at the right base is stable. No pneumothorax. Hyperaeration. Feeding tube placed. The tip is beyond the gastroesophageal junction. Upper normal heart size. IMPRESSION: Worsening consolidation throughout the left lung. Stable hazy opacity at the right base. Electronically Signed    By: Marybelle Killings M.D.   On: 03/19/2018 08:08    Cardiac Studies   Echocardiogram 06/20/2016: Study Conclusions: - Left ventricle: The cavity size was normal. There was mild   concentric hypertrophy. Systolic function was mildly to   moderately reduced. The estimated ejection fraction was in the   range of 40% to 45%. Hypokinesis of the anterior and   anterolateral with akinesis of the inferolateral myocardium.   Doppler parameters are consistent with abnormal left ventricular   relaxation (grade 1 diastolic dysfunction). Doppler parameters   are consistent with indeterminate ventricular filling pressure. - Aortic valve: Transvalvular velocity was within the normal range.   There was no stenosis. There was no regurgitation. - Mitral valve: Transvalvular velocity was within the normal range.   There was no evidence for stenosis. There was mild regurgitation. - Right ventricle: Systolic function was normal. - Atrial septum: No defect or patent foramen ovale was identified   by color flow Doppler. - Tricuspid valve: There was trivial regurgitation. - Pulmonary arteries: Systolic pressure was within the normal   range. PA peak pressure: 21 mm Hg (S). _______________  Left Heart Catheterization 03/02/2016:  Prox RCA to Mid RCA lesion, 90 %stenosed.  Dist LAD lesion, 85 %stenosed.    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 The right coronary is a very large territory and also has left right collaterals from the left coronary system. The likely scenario is the patient's inferolateral infarction occurred due to thrombotic occlusion and she subsequently had on pain is recanalization.  Widely patent circumflex.  Diffuse 85-90% apical LAD disease.  Elevated left ventricular end-diastolic pressure. Previously documented LVEF in the 30-35% range by noninvasive imaging.  Recommendations:  In absence of angina or ischemia on  nuclear scintigraphy, medical therapy would appear to be the treatment of choice.  LV dysfunction is likely secondary to chronic remodeling following inferolateral infarction which occurred remotely.  Guideline mandated heart failure therapy. Discontinuation amlodipine will allow further up titration of heart failure agents.  Patient Profile   Ms. Mace is a 78 y.o. female with a history of of CAD, ischemic cardiomyopathy with EF of 40-45% on Echo in 06/2016, hyperlipidemia, COPD, and left breast cancer s/p radiation and lumpectomy who presented to the [facility] ED on 03/08/2017 for evaluation of worsening shortness of breath and edema. On arrival, she was found to have profound anemia with a hemoglobin of 5.6. Chest x-ray consistent with left upper lobe pneumonia. Troponin also elevated. Cardiology was consulted for acute on chronic systolic CHF and NSTEMI.  Assessment & Plan    Acute Hypoxemic Respiratory Failure  - Extubated 03/16/2018. - Per primary team.  Acute Combined Systolic and Diastolic CHF  - Cardiomyopathy may be  ischemic with known CAD and newly reduced EF. - BNP elevated at 3,356.1. - Echo showed LVEF of 15-20% (down from 40-45% in 06/2016) with diffuse hypokinesis and grade 2 diastolic dysfunction.   - Patient has not received any additional fluids since yesterday morning. Dr. Marlou Porch has recommended trying to avoid IV fluids. - Documented output of 1.3 L in the past 24 hours since restarting Lasix. Lungs sound slightly better today. - Continue IV Lasix '40mg'$  twice daily as BP allows and renal function allows. - No ACEi/ARB due to soft BP.  - Currently on IV Metoprolol. Unable to take PO.  New Onset Atrial Fibrillation  - Telemetry shows atrial fibrillation with ventricular rates currently in the 80's to 120's. An episode of non-sustained VT up to 6 beats also noted. - Magneisum 2.4 today. At goal of > 2.0. - Potassium 4.5 today. At goal of > 4.0.  - Recommend IV Lopressor  '5mg'$  every 6 hours as BP allows. Goal is to keep heart rate <100 bpm. - Per Neurology, consider starting DOAC 7 days post stroke. Will defer to MD and Neurology.  Acute Embolic Stroke and Cerebral Edema - Felt to be due to new onset atrial fibrillation and cardiomyopathy with reduced EF. - Patient failed swallow study yesterday.  - Per Neurology, consider starting DOAC 7 days post stroke given size of infarct and hemorrhagic conversion. - Management per Neurology and primary team.   Elevated Troponin with Known CAD - Troponin peaked at 1.35. Rise and fall of troponin possibly due to demand ischemia in the setting of severe anemia with underlying CAD and systolic CHF. - Continue Lipitor and Zetia. - No coronary angiogram anticipated during this hospital stay. Follow-up as outpatient.  Acute Anemia of Unknown Etiology - Hemoglobin 5.6 on admission (11.3 in 02/2016).  - Patient received multiple blood transfusions with last one being on 1/10. - Aspirin has been started. - Hemoglobin 9.4 today (10.3 yesterday). - Will continue to monitor. - Management per primary team.  For questions or updates, please contact [place] Please consult [email or web address] for contact info under        Signed, Darreld Mclean, PA-C  03/19/2018, 12:30 PM     Insert addendum she is currently on facemask oxygen receiving nebulized therapy.  Worsening consolidation of left lung noted on x-ray.  Still struggling.  Telemetry demonstrates atrial fibrillation under reasonable rate control.  She appears weak, sitting in chair, irregularly irregular rhythm abdomen is soft 1+ bilateral lower extremity edema.  Lab work personally reviewed as above.  Assessment and plan:  Acute systolic heart failure - EF on this admission down to 15 to 20%.  Trying to avoid excessive IV fluids.  IV Lasix is being administered 40 mg twice a day.  Consolidation on left lung is more than pulmonary edema.  Atrial  fibrillation - Overall reasonable rate control.  Continuing with IV Lopressor.  Trying to keep heart rate less than 100.  Acute stroke with cerebral edema. - Continues to struggle.  Demand ischemia -Troponin I 0.35, stroke, underlying CAD and systolic heart failure.  Pulmonary consolidation, respiratory failure - Poor cough mechanics poor with respiratory status.  Marni Griffon, NP has discussed with patient, goals of care, is clear that she does not want to be reintubated.  BiPAP for support.  Is certainly possible that she will not progress or improve.  May need comfort care soon.  At this time, no new recommendations from a cardiac perspective.  We will go ahead and  sign off.  Please let us know if we can provide further assistance.  Candee Furbish, MD

## 2018-03-19 NOTE — Progress Notes (Signed)
NAME:  Kelly Benjamin, MRN:  [id number], DOB:  [birth year], LOS: 5 ADMISSION DATE:  03/17/2018, CONSULTATION DATE:  03/27/2018 REFERRING MD:  [facility], CHIEF COMPLAINT:  Vent management  History of present illness   78 year old woman with history of CAD, ischemic cardiomyopathy, HFrEF (EF 40-45% in 2018 and now 15-20% in 2020), COPD, HTN admitted on 03/07/2018 with dyspnea on exertion and BLE edema found to have left upper lobe pneumonia, acute symptomatic anemia (hgb 5.6), NSTEMI, and new onset atrial fibrillation. Initially required BiPAP. She was given Rocephin and azithromycin but this was transitioned to vanc/cefepime. She was diuresed during the hospital course. Tonight, she developed new onset left sided weakness and gaze deviation. CTA noted total occlusion of right proximal M1 segment without collaterals. Underwent right common carotid arteriogram and found to have occluded right ICA terminus, right MCA and right ACA. She underwent thrombectomy with IR 03/21/2018.   Past Medical History   CAD, ischemic cardiomyopathy, HFrEF (EF 40-45% in 2018), COPD, HTN, breast cancer, HLD  Significant Hospital Events   1/4> admit, working diagnosis of CAP, complicated by new onset atrial fibrillation, non-ST elevation MI and new heart failure 1/10> thrombectomy, transfer to ICU 1/11: Remains hemi-paretic on the left side, weaning.  MRI of brain: Showing a large MCA territory infarct involving the basal ganglia there are petechial hemorrhage throughout the infarct area without parenchymal hemorrhage.  There is partial effacement of the right lateral ventricle with 6 mm right to left shift.  Antibiotics completed.  Passed spontaneous breathing trial so extubated. 1/12 through 1/15: Initially extubated.  Cough mechanics poor, requiring escalating oxygen, diuresis, and pulmonary hygiene measures.  During the early a.m. hours of 1/15 developed significant worse respiratory distress necessitating IV  Lasix and placement of noninvasive positive pressure ventilation.  On a.m. exam remains labored, left hemithorax with worsening opacification. Consults:  IR 1/10 Neuro 1/10 GI 1/5 Cards 1/4  Procedures:  1/10> IR thrombectomy  Significant Diagnostic Tests:  CTA head/neck w/ w/o 1/10> 1. Emergent large vessel occlusion involving the proximal right M1 segment. 2. Very poor collateral flow to the right MCA distribution. 3. 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 4. Opacification of the left vertebral artery above the V1 segment may be retrograde flow. 5. Mild diffuse small vessel disease within the ACA branch vessels, left MCA branch vessels, and posterior circulation.  CT head w/o contrast 1/10> Acute right MCA territory infarct with decreased density in the right lentiform nucleus, right insular cortex, and right M1 and M2 cortex. Hyperdense right MCA suggesting acute thrombus.  CXR 1/7> Rounded opacity is again noted in the left mid lung   CT chest w/o contrast 1/4> Dense left upper lobe airspace disease. Small left pleural effusion noted. Mild interlobular septal thickening. Calcific coronary artery disease. Aortic atherosclerosis and emphysema  TTE 1/6>  - Left ventricle: The cavity size was normal. Wall thickness was increased in a pattern of mild LVH. Systolic function was severely reduced. The estimated ejection fraction was in the range of 15% to 20%. Diffuse hypokinesis. Features are consistent with a pseudonormal left ventricular filling pattern, with concomitant abnormal relaxation and increased filling pressure (grade 2 diastolic dysfunction). Doppler parameters are consistent with high ventricular filling pressure. - Aortic valve: Trileaflet; mildly thickened, mildly calcified   leaflets. - Mitral valve: There was moderate regurgitation. - Left atrium: The atrium was severely dilated. - Right ventricle: Systolic function was  moderately reduced. - Right atrium: The atrium was severely dilated. -  Tricuspid valve: There was moderate regurgitation. - Pulmonary arteries: Systolic pressure was mildly increased. PA   peak pressure: 41 mm Hg (S). MRI brain:MRI of brain: Showing a large MCA territory infarct involving the basal ganglia there are petechial hemorrhage throughout the infarct area without parenchymal hemorrhage.  There is partial effacement of the right lateral ventricle with 6 mm right to left shift   Micro Data:  Blood Cx x 2 1/4 > NGTD Respiratory culture 1/11> no orgs seen Antimicrobials:  Azithromycin 500 mg IV 1/4 > 1/8 Cefepime 1/7 > 1/12 Ceftriaxone 1/4 > 1/6 Vancomycin 1/6 > 1/9  Interim history/subjective:  Worsening respiratory status overnight, diuresed, placed back on noninvasive positive pressure ventilation. Objective   Blood pressure (Abnormal) 89/59, pulse 91, temperature 97.8 F (36.6 C), temperature source Axillary, resp. rate (Abnormal) 22, height 5\' 10"  (1.778 m), weight 79.8 kg, SpO2 96 %.    Vent Mode: BIPAP FiO2 (%):  [40 %-55 %] 40 % Set Rate:  [12 bmp-16 bmp] 12 bmp PEEP:  [5 cmH20] 5 cmH20 Pressure Support:  [5 cmH20] 5 cmH20   Intake/Output Summary (Last 24 hours) at 03/19/2018 0846 Last data filed at 03/19/2018 0345 Gross per 24 hour  Intake 1017.43 ml  Output 1310 ml  Net -292.57 ml   Filed Weights   03/17/18 0200 03/18/18 0500 03/19/18 0500  Weight: 80 kg 80.8 kg 79.8 kg    Examination: 78 year old white female currently on noninvasive positive pressure ventilation.  She nods appropriately, denies distress as long as she is on BiPAP HEENT normocephalic atraumatic.  She has a BiPAP mask in place Pulmonary: Coarse scattered rhonchi with accessory use after minimal exertion.  She is a bit more diminished on the left than right. Cardiac: Atrial fibrillation on telemetry monitoring.  She has frequent bursts no PAF Abdomen: Soft, not tender.  No organomegaly.   Positive bowel sounds Extremities: Warm, dry, no significant edema, brisk capillary refill Neuro: Awake, nods appropriately.  Left-sided hemiparesis persists.  Cough mechanics are extremely weak  Resolved Hospital Problems:   Hypotension, AKI, CAP (NOS)->treated  Assessment and plan   Acute R MCA CVA, acute R ICA occlusion s/p embolectomy -Stroke team signed off Plan Continuing aspirin, Zetia, and statin. N.p.o. Additional neuro diagnostics and rehab efforts on hold given clinical respiratory decline  Persistent acute respiratory failure in the setting of mucous plugging, atelectasis and aspiration following CVA.  Superimposed on recent community-acquired pneumonia -Portable chest x-ray was personally reviewed: This demonstrates bilateral airspace disease.  There is significant worsening of the left hemithorax primarily due to mucous plugging and atelectasis superimposed on prior consolidation -Now on BiPAP, work of breathing still labored -Refusing chest physiotherapy Plan We will continue noninvasive positive pressure ventilation for now Continue 3% nebulized saline Lasix Bronchodilators DO NOT RESUSCITATE, DO NOT INTUBATE. 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) ECHO 1/6 shows LVEF 15-20%, significant decrease from prior Plan Holding ACE inhibitor and ARB due to borderline blood pressure Lasix as tolerated KVO IV fluids  Atrial Fibrilation Plan Telemetry monitoring PRN beta-blockade  Acute anemia -etiology unknown Plan  Continuing Protonix Trending CBC, transfuse if hemoglobin drops less than 7 She is  not a  candidate for EGD at this point given her ineffective airway clearance    Dysphasia with critical care related malnutrition Failed swallow study  Plan N.p.o., continue tube feeds   Best practice:  Diet: NPO  Pain/Anxiety/Delirium protocol (if indicated): APAP VAP protocol (if indicated): n/a DVT prophylaxis: SCDs GI prophylaxis: Protonix Glucose control: Monitor Mobility: PT/OT  Code Status: DO NOT RESUSCITATE, noninvasive positive pressure ventilation only Family Communication: No family. Close friend at bedside.  Disposition:  Keep in intensive care at this point.  She has significant clinical decline since extubation.  She is now temporized on noninvasive positive pressure ventilation, but I have very little to offer as her issue is more airway clearance.  I do not think she will survive.  We can can continue noninvasive positive pressure ventilation, pulmonary hygiene measures, and diuresis.  Positive pressure with noninvasive mask in a patient who cannot protect her airway is worrisome.  She indicates she is getting some relief with this so as long as she is getting more comfort than distress I think it is okay to continue.  I plan on meeting with family later today to discuss transitioning to comfort, or at least preparing them that that is the next step.   My critical care time is 40 minutes Erick Colace ACNP-BC [place] Pager # [phone] OR # [phone] if no answer  03/19/2018, 8:46 AM

## 2018-03-19 NOTE — Progress Notes (Signed)
Pt placed on Bipap per MD order due to increased WOB. Mask secured. Pt tolerating well. Will continue to monitor.

## 2018-03-19 NOTE — Care Management Note (Signed)
Case Management Note  Patient Details  Name: Kelly Benjamin MRN: [id number] Date of Birth: Jun 09, 1940  Subjective/Objective:  Pt admitted on 03/09/2018 with PNA and NSTEMI.  Code stroke called 03/22/2018 due to Lt sided weakness; pt to IR for revascularization of RT ICA.  PTA, pt resided at home alone; has support from friends and family.                   Action/Plan: PT/OT recommending CIR vs SNF, though pt requiring BIPAP currently, and may continue to decline. Pt has made her wishes know to provider, and she does not want reintubation or tracheostomy. Will continue to follow.    Expected Discharge Date:                  Expected Discharge Plan:     In-House Referral:     Discharge planning Services  CM Consult  Post Acute Care Choice:    Choice offered to:     DME Arranged:    DME Agency:     HH Arranged:    HH Agency:     Status of Service:  In process, will continue to follow  If discussed at Long Length of Stay Meetings, dates discussed:    Additional Comments:  Reinaldo Raddle, RN, BSN  Trauma/Neuro ICU Case Manager [phone]

## 2018-03-19 NOTE — Progress Notes (Signed)
[facility] Progress Note Patient Name: Kelly Benjamin DOB: 05-18-1940 MRN: [id number]   Date of Service  03/19/2018  HPI/Events of Note  Increased work of breathing with impending acute respiratory failure of multifactorial etiology. Pt is DNI and partial code status at this time.  eICU Interventions  Trial of Bipap with ABG in one hour.        [name] Janila Arrazola 03/19/2018, 3:07 AM

## 2018-03-19 NOTE — Progress Notes (Signed)
Physical Therapy Treatment Patient Details Name: Kelly Benjamin MRN: [id number] DOB: [birth year] Today's Date: 03/19/2018    History of Present Illness 78 y.o. female admitted on 03/15/2018 for SOB.  Pt initally dx with PNA, NSTEMI vs demand ischemia vs ACS.  Pt with symptomatic anemia (GI consulted ro r/o bleed).  Pt converted to A-fib with RVR on and off of HFNC and BiPAP.  Pt's course complicated when code stroke was called on 03/29/2018 due to sudden onset L sided weakness.  Pt underwent revascularization of R ICA. tPA was not given due to suspected GIB.  Pt intubated for procedure and extubated 02/14/19.  Pt with other significant PMH of HTN, COPD, CAD, L Breast CA, knee surgery.      PT Comments    Pt was agreeable to OOB to chair today, eagerly wanting to work with therapy.  She continues to have increased WOB and audible secretions that sound like she is having difficulty managing.  VSS on 2 L O2 [place].  She continues to want to work with therapy to get up OOB.  Unsure of her respiratory future, but we will continue to progress mobility as able.  Follow Up Recommendations  CIR     Equipment Recommendations  Wheelchair (measurements PT);Wheelchair cushion (measurements PT);3in1 (PT);Hospital bed    Recommendations for Other Services   NA     Precautions / Restrictions Precautions Precautions: Fall Precaution Comments: L neglect and hemiplegia, cortrack Restrictions Weight Bearing Restrictions: No    Mobility  Bed Mobility Overal bed mobility: Needs Assistance Bed Mobility: Supine to Sit     Supine to sit: Max assist;+2 for physical assistance;+2 for safety/equipment;HOB elevated     General bed mobility comments: max assist +2 to manage L LE and scoot hips towards EOB, patient able to elevate trunk with mod assist and initate movement with R LE/UE, cueing to sequence and total assist to manage L UE   Transfers Overall transfer level: Needs assistance Equipment used: (2 person  lift with gait belt and bed pad) Transfers: Sit to/from Stand;Stand Pivot Transfers Sit to Stand: Max assist;+2 physical assistance Stand pivot transfers: Max assist;+2 physical assistance       General transfer comment: max assist +2 to ascend into standing, heavy L lateral lean with cueing for sequencing and safety.  Pt was attempting to reach towards the chair and did assist in powering up.  Ambulation/Gait             General Gait Details: unable at this time.     Modified Rankin (Stroke Patients Only) Modified Rankin (Stroke Patients Only) Pre-Morbid Rankin Score: No symptoms Modified Rankin: Severe disability     Balance Overall balance assessment: Needs assistance Sitting-balance support: Feet supported;Single extremity supported Sitting balance-Leahy Scale: Poor Sitting balance - Comments: max assist to sit EOB, at best mod assist, decreased pushing towards L today but increased L lateral lean; utilized lean for weightbearing through L hand/UE at times.  Pt also with forward flexed head laterally flexed to the left, difficulty holding her head upright.  Postural control: Left lateral lean Standing balance support: Single extremity supported Standing balance-Leahy Scale: Zero Standing balance comment: two person max assist.                             Cognition Arousal/Alertness: Awake/alert Behavior During Therapy: Flat affect Overall Cognitive Status: Impaired/Different from baseline Area of Impairment: Attention;Safety/judgement;Awareness;Problem solving;Following commands  Current Attention Level: Selective Memory: Decreased short-term memory Following Commands: Follows one step commands with increased time;Follows one step commands consistently Safety/Judgement: Decreased awareness of deficits Awareness: Emergent Problem Solving: Slow processing;Difficulty sequencing;Requires verbal cues;Requires tactile cues;Decreased  initiation(decreased initation on L side (LE)) General Comments: continues to require increased time to process and sequence, L inattention remains          General Comments General comments (skin integrity, edema, etc.): VSS, on 2L supplemental oxgyen via [place] oxgyen > 98%; but noted increased secretions, accessory muscle use for breathing and increased DOE/WOB 3/4 in sitting EOB, pt sounds like she is moving secreations in sitting by coughing, but uless they are suctioned out she sounds like she is having significant difficulty when she attempts to swallow them; foward head posture.        Pertinent Vitals/Pain Pain Assessment: Faces Pain Score: 0-No pain Faces Pain Scale: No hurt           PT Goals (current goals can now be found in the care plan section) Acute Rehab PT Goals Patient Stated Goal: none stated Progress towards PT goals: Progressing toward goals    Frequency    Min 4X/week      PT Plan Current plan remains appropriate    Co-evaluation PT/OT/SLP Co-Evaluation/Treatment: Yes Reason for Co-Treatment: Complexity of the patient's impairments (multi-system involvement);Necessary to address cognition/behavior during functional activity;For patient/therapist safety;To address functional/ADL transfers PT goals addressed during session: Mobility/safety with mobility;Balance;Strengthening/ROM OT goals addressed during session: ADL's and self-care;Other (comment)(mobility)      AM-PAC PT "6 Clicks" Mobility   Outcome Measure  Help needed turning from your back to your side while in a flat bed without using bedrails?: Total Help needed moving from lying on your back to sitting on the side of a flat bed without using bedrails?: Total Help needed moving to and from a bed to a chair (including a wheelchair)?: Total Help needed standing up from a chair using your arms (e.g., wheelchair or bedside chair)?: Total Help needed to walk in hospital room?: Total Help needed  climbing 3-5 steps with a railing? : Total 6 Click Score: 6    End of Session Equipment Utilized During Treatment: Gait belt;Oxygen Activity Tolerance: Patient limited by fatigue Patient left: in chair;with call bell/phone within reach;with chair alarm set;with family/visitor present Nurse Communication: Mobility status PT Visit Diagnosis: Muscle weakness (generalized) (M62.81);Difficulty in walking, not elsewhere classified (R26.2);Other symptoms and signs involving the nervous system (R29.898);Hemiplegia and hemiparesis Hemiplegia - Right/Left: Left Hemiplegia - dominant/non-dominant: Non-dominant Hemiplegia - caused by: Cerebral infarction     Time: 8295-6213 PT Time Calculation (min) (ACUTE ONLY): 34 min  Charges:  $Therapeutic Activity: 8-22 mins             Jaselynn B. Jazia Faraci, PT, DPT  Acute Rehabilitation [phone] pager #[phone] office            03/19/2018, 5:51 PM

## 2018-03-19 NOTE — Progress Notes (Signed)
Goals of care discussion Bedside discussion including the patient, as well as her healthcare power of attorney Ms. Kelly Benjamin.  We talked about the patient's stroke, her worsening respiratory status, poor cough mechanics, and high risk of further respiratory decline.  The patient has been very clear that she does not want reintubation, she does not want tracheostomy.  We discussed her current care within the context of these limitations.  We will continue to offer BiPAP PRN, also placing morphine as needed as well.  We will continue to provide supportive care for as long as the patient would like to continue, however I think she will likely continue to decline.  I explained to her power of attorney that we may soon need to transition to comfort.  She understands the current plan of care   Erick Colace ACNP-BC [place] Pager # [phone] OR # [phone] if no answer

## 2018-03-19 NOTE — Progress Notes (Signed)
Occupational Therapy Treatment Patient Details Name: Kelly Benjamin MRN: [id number] DOB: 1940-07-19 Today's Date: 03/19/2018    History of present illness 78 y.o. female admitted on 03/08/2018 for SOB.  Pt initally dx with PNA, NSTEMI vs demand ischemia vs ACS.  Pt with symptomatic anemia (GI consulted ro r/o bleed).  Pt converted to A-fib with RVR on and off of HFNC and BiPAP.  Pt's course complicated when code stroke was called on 03/24/2018 due to sudden onset L sided weakness.  Pt underwent revascularization of R ICA. tPA was not given due to suspected GIB.  Pt intubated for procedure and extubated 02/14/19.  Pt with other significant PMH of HTN, COPD, CAD, L Breast CA, knee surgery.     OT comments  Patient supine in bed and eager to participate in OT/PT session at get OOB.  Continues to be limited by L hemiparesis, L lateral lean, impaired balance, and decreased activity tolerance.  Max assist +2 to transition to EOB with heavy L lateral lean, but less pushing towards L side today.  Stand pivot to recliner with max assist +2, heavy L lateral lean. Forward head posture with poor neck control, improved visual scanning past midline on L side.  Oxygen saturations maintained >98% on 2L supplemental via [place], but noted increased work of breathing and audible crackling.  Will follow.     Follow Up Recommendations  Other (comment)(post acute rehab at tolerated (CIR vs SNF) )    Equipment Recommendations  Other (comment)(TBD)    Recommendations for Other Services Other (comment)(palliative care )    Precautions / Restrictions Precautions Precautions: Fall Precaution Comments: L neglect and hemiplegia, cortrack Restrictions Weight Bearing Restrictions: No       Mobility Bed Mobility Overal bed mobility: Needs Assistance Bed Mobility: Supine to Sit     Supine to sit: Max assist;+2 for physical assistance;+2 for safety/equipment;HOB elevated     General bed mobility comments: max assist +2  to manage L LE and scoot hips towards EOB, patient able to elevate trunk with mod assist and initate movement with R LE/UE, cueing to sequence and total assist to manage L UE   Transfers Overall transfer level: Needs assistance Equipment used: (2 person lift with gait belt and bed pad) Transfers: Sit to/from Stand;Stand Pivot Transfers Sit to Stand: Max assist;+2 physical assistance Stand pivot transfers: Max assist;+2 physical assistance       General transfer comment: max assist +2 to ascend into standing, heavy L lateral lean with cueing for sequencing and safety    Balance Overall balance assessment: Needs assistance Sitting-balance support: Feet supported;Single extremity supported Sitting balance-Leahy Scale: Poor Sitting balance - Comments: max assist to sit EOB, at best mod assist, decreased pushing towards L today but increased L lateral lean; utilized lean for weightbearing through L hand/UE at times  Postural control: Left lateral lean                                 ADL either performed or assessed with clinical judgement   ADL Overall ADL's : Needs assistance/impaired                         Toilet Transfer: Maximal assistance;+2 for physical assistance;+2 for safety/equipment;Stand-pivot Toilet Transfer Details (indicate cue type and reason): simulated to recliner          Functional mobility during ADLs: Maximal assistance;+2 for physical assistance;+2  for safety/equipment;Cueing for sequencing;Cueing for safety General ADL Comments: continues to present with L hemiparesis and decreased awareness, L lateral lean and foward head posturing      Vision   Additional Comments: patient able to gaze past midline towards L side today, but unable to read clock    Perception     Praxis      Cognition Arousal/Alertness: Awake/alert Behavior During Therapy: Flat affect Overall Cognitive Status: Impaired/Different from baseline Area of  Impairment: Attention;Safety/judgement;Awareness;Problem solving;Following commands                   Current Attention Level: Selective Memory: Decreased short-term memory Following Commands: Follows one step commands with increased time;Follows one step commands consistently Safety/Judgement: Decreased awareness of deficits Awareness: Emergent Problem Solving: Slow processing;Difficulty sequencing;Requires verbal cues;Requires tactile cues;Decreased initiation(decreased initation on L side (LE)) General Comments: continues to require increased time to process and sequence, L inattention remains         Exercises     Shoulder Instructions       General Comments VSS, on 2L supplemental oxgyen via [place] oxgyen > 98%; but noted increased secretions; foward head posture     Pertinent Vitals/ Pain       Pain Assessment: Faces Faces Pain Scale: No hurt  Home Living                                          Prior Functioning/Environment              Frequency  Min 3X/week        Progress Toward Goals  OT Goals(current goals can now be found in the care plan section)  Progress towards OT goals: Progressing toward goals  Acute Rehab OT Goals Patient Stated Goal: none stated Time For Goal Achievement: 03/30/18 Potential to Achieve Goals: Good  Plan Discharge plan remains appropriate;Frequency remains appropriate    Co-evaluation    PT/OT/SLP Co-Evaluation/Treatment: Yes Reason for Co-Treatment: Complexity of the patient's impairments (multi-system involvement)   OT goals addressed during session: ADL's and self-care;Other (comment)(mobility)      AM-PAC OT "6 Clicks" Daily Activity     Outcome Measure   Help from another person eating meals?: Total Help from another person taking care of personal grooming?: A Lot Help from another person toileting, which includes using toliet, bedpan, or urinal?: Total Help from another person bathing  (including washing, rinsing, drying)?: Total Help from another person to put on and taking off regular upper body clothing?: Total Help from another person to put on and taking off regular lower body clothing?: Total 6 Click Score: 7    End of Session Equipment Utilized During Treatment: Oxygen  OT Visit Diagnosis: Other abnormalities of gait and mobility (R26.89);Muscle weakness (generalized) (M62.81);Hemiplegia and hemiparesis Hemiplegia - Right/Left: Left Hemiplegia - dominant/non-dominant: Non-Dominant Hemiplegia - caused by: Cerebral infarction   Activity Tolerance Patient tolerated treatment well   Patient Left with call bell/phone within reach;in chair;with chair alarm set;with family/visitor present   Nurse Communication Mobility status        Time: 5397-6734 OT Time Calculation (min): 25 min  Charges: OT General Charges $OT Visit: 1 Visit OT Treatments $Self Care/Home Management : 8-22 mins  Delight Stare, OT Acute Rehabilitation Services Pager [phone] Office [phone]    Delight Stare 03/19/2018, 2:16 PM

## 2018-03-19 NOTE — Progress Notes (Signed)
SLP Cancellation Note  Patient Details Name: MISK GALENTINE MRN: [id number] DOB: [birth year]   Cancelled treatment:       Reason Eval/Treat Not Completed: Medical issues which prohibited therapy; Pt on BiPAP. Will follow along.    Alecxander Mainwaring L. Tivis Ringer, [place] CCC/SLP Acute Rehabilitation Services Office number [phone] Pager [phone]    Juan Quam [name] 03/19/2018, 10:34 AM

## 2018-03-19 NOTE — Progress Notes (Signed)
[facility] TEAM 1 - Stepdown/ICU TEAM  Kelly Benjamin  XBJ:[id number] DOB: 1940/09/15 DOA: 04/01/2018 PCP: Maurice Small, MD    Brief Narrative:  78 year old woman with history of CAD, ischemic cardiomyopathy, CHF (EF 40-45% in 2018 and 15-20% in 2020), COPD, and HTN who was admitted on1/4/2020with dyspnea on exertion and BLE edema and found to have left upper lobe pneumonia, acute anemia (hgb 5.6), NSTEMI, and new onset atrial fibrillation. Initially required BiPAP. She was given Rocephin and azithromycin but this was transitioned to vanc/cefepime. She was diuresed during the hospital course. She developed new onset left sided weakness and gaze deviation. CTA noted total occlusion of right proximal M1 segment without collaterals. Underwent right common carotid arteriogram and found to have occluded right ICA terminus, right MCA and right ACA. She underwent thrombectomy with IR 03/18/2018.   Significant Events: 1/4 admit, working diagnosis of CAP, complicated by new onset atrial fibrillation, non-ST elevation MI and new heart failure 1/10 thrombectomy, transfer to ICU 1/11 Remains hemi-paretic on the left side, weaning. MRI of the brain showed large MCA territory infarct involving basal ganglia and cortex, petechial hemorrhage throughout the infarct without parenchymal hemorrhage.  Mass-effect with partial effacement of the right lateral ventricle and 6 mm right-to-left midline shift. 1/12 Patient currently extubated.  On oxygen by nasal cannula. 1/15: Patient on oxygen by nasal cannula, having increased secretions.  Subjective: Patient with cough, having increased secretions.  On oxygen by nasal cannula.  Responds to verbal stimuli.  No nausea no vomiting other acute events overnight.  Unable to tolerate chest PT.  Assessment & Plan:  Acute Right MCA CVA, Acute Right ICA Occlusion s/p IR Embolectomy 1/10 Care per Neurology  Acute hypoxic respiratory failure - PNA Completed a course of  cefepime for suspected pneumonia 03/19/2018: Unfortunately I think patient is continuing to aspirate.  She already completed antibiotics but due to ongoing aspiration likely from the stroke she is at high risk for worsening respiratory failure.  Acute kidney injury 03/19/2018: Creatinine 1.18.  COPD Care per PCCM   Hypotension  NSTEMI troponin peaked to 1.35 on 1/5 - per Cardiology absence of angina or ischemia on nuclear scintigraphy, recommend medical therapy  Acute on chronic combined systolic and diastolic CHF Being diuresed with Lasix.  New onset atrial fibrillation Care per Cardiology   Acute symptomatic anemia status post 2 units PRBCs  Transaminitis Monitor LFTs  Goals of care discussion. Patient's friends, [name] has power of attorney.  Explained patient's prognosis friend with low EF, acute on chronic CHF, A. fib with RVR, symptomatic anemia, non-STEMI, hypotension, poor p.o. intake, severe respiratory distress, and acute stroke. In the situation of the patient actually has a major event her chances of recovery are negligible and her chances of surviving severe event are also less. Currently CODE STATUS is changed from full code to partial code with BiPAP only when needed. Anticipating poor prognosis going forward.  We will consult palliative care to discuss goals of care.  DVT prophylaxis: SCDs Code Status: Partial code, BiPAP only. Family Communication: Does not have any family.  Actually has 2 friends.  1 of them has power of attorney. Discussed bedside. Disposition Plan: Unknown at this time  Consultants:  Neurology PCCM Cardiology IR   Antimicrobials:  None presently   Subjective: Patient on oxygen by nasal cannula.  Ill-appearing female, following few commands.  Having shortness of breath, wet cough.  Objective: Blood pressure 127/79, pulse 91, temperature 97.8 F (36.6 C), temperature source Axillary, resp. rate Marland Kitchen)  26, height '5\' 10"'$  (1.778  m), weight 79.8 kg, SpO2 98 %.  Intake/Output Summary (Last 24 hours) at 03/19/2018 1133 Last data filed at 03/19/2018 0345 Gross per 24 hour  Intake 846.94 ml  Output 1310 ml  Net -463.06 ml   Filed Weights   03/17/18 0200 03/18/18 0500 03/19/18 0500  Weight: 80 kg 80.8 kg 79.8 kg    Examination: General exam:  Ill-appearing female, on oxygen by nasal cannula Respiratory system: Bilateral rhonchi Cardiovascular system: Irregularly irregular  Gastrointestinal system: Abdomen is nondistended, soft. No organomegaly or masses felt. Normal bowel sounds heard. Central nervous system:  Has generalized weakness, following few commands Extremities: Hemiparesis on right Skin: No rashes, lesions or ulcers    CBC: Recent Labs  Lab 03/15/18 0602  03/17/18 0749 03/18/18 0529 03/19/18 0349  WBC 17.0*   < > 22.0* 19.3* 16.1*  NEUTROABS 14.2*  --   --   --   --   HGB 9.8*   < > 10.8* 10.3* 9.4*  HCT 32.0*   < > 35.4* 33.4* 32.1*  MCV 84.0   < > 86.3 85.6 86.5  PLT 301   < > 268 312 301   < > = values in this interval not displayed.   Basic Metabolic Panel: Recent Labs  Lab 03/15/18 0602  03/18/18 0529 03/18/18 1543 03/19/18 0349  NA  --    < > 140 143 144  K  --    < > 4.3 4.1 4.5  CL  --    < > 113* 117* 112*  CO2  --    < > 16* 16* 18*  GLUCOSE  --    < > 154* 170* 150*  BUN  --    < > 35* 40* 43*  CREATININE  --    < > 1.05* 1.15* 1.18*  CALCIUM  --    < > 9.6 9.6 9.5  MG  --    < > 2.6* 2.6* 2.4  PHOS 3.1  --  3.3  --  2.9   < > = values in this interval not displayed.   GFR: Estimated Creatinine Clearance: 43.9 mL/min (A) (by C-G formula based on SCr of 1.18 mg/dL (H)).  Liver Function Tests: No results for input(s): AST, ALT, ALKPHOS, BILITOT, PROT, ALBUMIN in the last 168 hours.  Coagulation Profile: Recent Labs  Lab 03/13/2018 2201  INR 1.35    HbA1C: Hgb A1c MFr Bld  Date/Time Value Ref Range Status  03/15/2018 06:02 AM 6.1 (H) 4.8 - 5.6 % Final     Comment:    (NOTE) Pre diabetes:          5.7%-6.4% Diabetes:              >6.4% Glycemic control for   <7.0% adults with diabetes   02/29/2016 05:01 AM 5.4 4.8 - 5.6 % Final    Comment:    (NOTE)         Pre-diabetes: 5.7 - 6.4         Diabetes: >6.4         Glycemic control for adults with diabetes: <7.0     CBG: Recent Labs  Lab 03/18/18 1531 03/18/18 1937 03/18/18 2329 03/19/18 0314 03/19/18 0801  GLUCAP 155* 148* 164* 149* 114*    Recent Results (from the past 240 hour(s))  MRSA PCR Screening     Status: None   Collection Time: 03/22/2018 11:45 PM  Result Value Ref Range Status  MRSA by PCR NEGATIVE NEGATIVE Final    Comment:        The GeneXpert MRSA Assay (FDA approved for NASAL specimens only), is one component of a comprehensive MRSA colonization surveillance program. It is not intended to diagnose MRSA infection nor to guide or monitor treatment for MRSA infections. Performed at [facility], [place] [address] [place], [place] [address]   Culture, respiratory (non-expectorated)     Status: None   Collection Time: 03/15/18 11:48 AM  Result Value Ref Range Status   Specimen Description TRACHEAL ASPIRATE  Final   Special Requests NONE  Final   Gram Stain NO WBC SEEN NO ORGANISMS SEEN   Final   Culture   Final    FEW Consistent with normal respiratory flora. Performed at [facility], [place] [address], [place], [place] [address]    Report Status 03/17/2018 FINAL  Final     Scheduled Meds: .  stroke: mapping our early stages of recovery book   Does not apply Once  . arformoterol  15 mcg Nebulization BID  . aspirin  325 mg Oral Daily  . atorvastatin  20 mg Oral QHS  . budesonide (PULMICORT) nebulizer solution  0.25 mg Nebulization BID  . chlorhexidine gluconate (MEDLINE KIT)  15 mL Mouth Rinse BID  . ezetimibe  10 mg Oral Daily  . feeding supplement (PRO-STAT SUGAR FREE 64)  30 mL Per Tube Daily  . furosemide  40 mg Intravenous Q12H  .  ipratropium-albuterol  3 mL Nebulization TID  . mouth rinse  15 mL Mouth Rinse Q4H  . multivitamin  1 tablet Oral BID  . pantoprazole (PROTONIX) IV  40 mg Intravenous Q24H  . sodium chloride HYPERTONIC  4 mL Nebulization TID     LOS: 11 days   Author:  Yaakov Guthrie, MD [facility] 03/19/2018  If 7PM-7AM, please contact night-coverage To reach On-call, see [email or web address]   If 7PM-7AM, please contact night-coverage per Amion 03/19/2018, 11:33 AM

## 2018-03-19 NOTE — Progress Notes (Signed)
RT NOTE:  Vest CPT was not performed during morning rounds. Pt is comfortably sleeping in bed. RN aware.

## 2018-03-20 ENCOUNTER — Inpatient Hospital Stay (HOSPITAL_COMMUNITY): Payer: Medicare PPO

## 2018-03-20 DIAGNOSIS — Z7189 Other specified counseling: Secondary | ICD-10-CM

## 2018-03-20 DIAGNOSIS — Z515 Encounter for palliative care: Secondary | ICD-10-CM

## 2018-03-20 DIAGNOSIS — J690 Pneumonitis due to inhalation of food and vomit: Secondary | ICD-10-CM

## 2018-03-20 LAB — GLUCOSE, CAPILLARY
GLUCOSE-CAPILLARY: 132 mg/dL — AB (ref 70–99)
Glucose-Capillary: 133 mg/dL — ABNORMAL HIGH (ref 70–99)
Glucose-Capillary: 140 mg/dL — ABNORMAL HIGH (ref 70–99)

## 2018-03-20 LAB — BASIC METABOLIC PANEL
Anion gap: 9 (ref 5–15)
BUN: 44 mg/dL — ABNORMAL HIGH (ref 8–23)
CO2: 22 mmol/L (ref 22–32)
Calcium: 9.3 mg/dL (ref 8.9–10.3)
Chloride: 115 mmol/L — ABNORMAL HIGH (ref 98–111)
Creatinine, Ser: 1.05 mg/dL — ABNORMAL HIGH (ref 0.44–1.00)
GFR calc non Af Amer: 52 mL/min — ABNORMAL LOW (ref >60)
GFR, EST AFRICAN AMERICAN: 60 mL/min — AB (ref >60)
Glucose, Bld: 143 mg/dL — ABNORMAL HIGH (ref 70–99)
Potassium: 4.3 mmol/L (ref 3.5–5.1)
Sodium: 146 mmol/L — ABNORMAL HIGH (ref 135–145)

## 2018-03-20 LAB — CBC
HCT: 31 % — ABNORMAL LOW (ref 36.0–46.0)
Hemoglobin: 9 g/dL — ABNORMAL LOW (ref 12.0–15.0)
MCH: 25.6 pg — ABNORMAL LOW (ref 26.0–34.0)
MCHC: 29 g/dL — ABNORMAL LOW (ref 30.0–36.0)
MCV: 88.1 fL (ref 80.0–100.0)
Platelets: 285 10*3/uL (ref 150–400)
RBC: 3.52 MIL/uL — ABNORMAL LOW (ref 3.87–5.11)
RDW: 20.8 % — ABNORMAL HIGH (ref 11.5–15.5)
WBC: 17.7 10*3/uL — ABNORMAL HIGH (ref 4.0–10.5)
nRBC: 0.2 % (ref 0.0–0.2)

## 2018-03-20 LAB — MAGNESIUM: Magnesium: 2.1 mg/dL (ref 1.7–2.4)

## 2018-03-20 LAB — PHOSPHORUS: Phosphorus: 3.2 mg/dL (ref 2.5–4.6)

## 2018-03-20 LAB — AMMONIA: Ammonia: 22 umol/L (ref 9–35)

## 2018-03-20 MED ORDER — MORPHINE SULFATE (PF) 2 MG/ML IV SOLN
1.0000 mg | INTRAVENOUS | Status: DC | PRN
  Administered 2018-03-20: 2 mg via INTRAVENOUS
  Administered 2018-03-20 – 2018-03-21 (×4): 3 mg via INTRAVENOUS
  Filled 2018-03-20 (×4): qty 2
  Filled 2018-03-20: qty 1

## 2018-03-20 MED ORDER — ATORVASTATIN CALCIUM 10 MG PO TABS: 20.0000 mg | ORAL_TABLET | Freq: Every day | ORAL | Status: DC

## 2018-03-20 MED ORDER — FUROSEMIDE 10 MG/ML IJ SOLN
40.0000 mg | Freq: Two times a day (BID) | INTRAMUSCULAR | Status: AC
  Administered 2018-03-20 (×2): 40 mg via INTRAVENOUS
  Filled 2018-03-20 (×2): qty 4

## 2018-03-20 MED ORDER — GLYCOPYRROLATE 0.2 MG/ML IJ SOLN
0.2000 mg | INTRAMUSCULAR | Status: DC | PRN
  Administered 2018-03-21 – 2018-03-22 (×3): 0.2 mg via INTRAVENOUS
  Filled 2018-03-20 (×2): qty 1

## 2018-03-20 MED ORDER — MORPHINE SULFATE (CONCENTRATE) 10 MG/0.5ML PO SOLN: 5.0000 mg | ORAL | Status: DC | PRN

## 2018-03-20 MED ORDER — EZETIMIBE 10 MG PO TABS
10.0000 mg | ORAL_TABLET | Freq: Every day | ORAL | Status: DC
  Administered 2018-03-20: 10 mg
  Filled 2018-03-20: qty 1

## 2018-03-20 MED ORDER — LORAZEPAM 2 MG/ML PO CONC: 0.5000 mg | ORAL | Status: DC | PRN

## 2018-03-20 MED ORDER — SODIUM CHLORIDE 0.9% FLUSH: 3.0000 mL | INTRAVENOUS | Status: DC | PRN

## 2018-03-20 MED ORDER — SODIUM CHLORIDE 0.9% FLUSH
3.0000 mL | Freq: Two times a day (BID) | INTRAVENOUS | Status: DC
  Administered 2018-03-20 – 2018-03-22 (×3): 3 mL via INTRAVENOUS

## 2018-03-20 MED ORDER — GLYCOPYRROLATE 1 MG PO TABS
1.0000 mg | ORAL_TABLET | ORAL | Status: DC | PRN
  Filled 2018-03-20: qty 1

## 2018-03-20 MED ORDER — ADULT MULTIVITAMIN LIQUID CH
15.0000 mL | Freq: Every day | ORAL | Status: DC
  Administered 2018-03-20: 15 mL
  Filled 2018-03-20: qty 15

## 2018-03-20 MED ORDER — HALOPERIDOL LACTATE 2 MG/ML PO CONC
0.5000 mg | ORAL | Status: DC | PRN
  Filled 2018-03-20: qty 0.3

## 2018-03-20 MED ORDER — POTASSIUM CHLORIDE 20 MEQ/15ML (10%) PO SOLN
40.0000 meq | Freq: Once | ORAL | Status: AC
  Administered 2018-03-20: 40 meq
  Filled 2018-03-20: qty 30

## 2018-03-20 MED ORDER — GLYCOPYRROLATE 0.2 MG/ML IJ SOLN
0.2000 mg | INTRAMUSCULAR | Status: DC | PRN
  Filled 2018-03-20: qty 1

## 2018-03-20 MED ORDER — LORAZEPAM 2 MG/ML IJ SOLN: 0.5000 mg | INTRAMUSCULAR | Status: DC | PRN

## 2018-03-20 MED ORDER — HALOPERIDOL LACTATE 5 MG/ML IJ SOLN
0.5000 mg | INTRAMUSCULAR | Status: DC | PRN
  Administered 2018-03-21 – 2018-03-22 (×3): 0.5 mg via INTRAVENOUS
  Filled 2018-03-20 (×3): qty 1

## 2018-03-20 MED ORDER — SODIUM CHLORIDE 0.9 % IV SOLN: 250.0000 mL | INTRAVENOUS | Status: DC | PRN

## 2018-03-20 MED ORDER — ASPIRIN 325 MG PO TABS
325.0000 mg | ORAL_TABLET | Freq: Every day | ORAL | Status: DC
  Administered 2018-03-20: 325 mg
  Filled 2018-03-20: qty 1

## 2018-03-20 MED ORDER — LORAZEPAM 0.5 MG PO TABS: 0.5000 mg | ORAL_TABLET | ORAL | Status: DC | PRN

## 2018-03-20 MED ORDER — HALOPERIDOL 0.5 MG PO TABS
0.5000 mg | ORAL_TABLET | ORAL | Status: DC | PRN
  Filled 2018-03-20: qty 1

## 2018-03-20 MED ORDER — FREE WATER
200.0000 mL | Status: DC
  Administered 2018-03-20 (×2): 200 mL

## 2018-03-20 NOTE — Progress Notes (Signed)
IP rehab admissions - I spoke with friend at the bedside who is patient's HCPOA.  She is in agreement that patient will need SNF placement once she is medically ready for discharge.  Please see rehab consult recommending SNF.  I will sign off for inpatient rehab admissions at this time.  Call me for questions.  [phone]

## 2018-03-20 NOTE — Consult Note (Signed)
Consultation Note Date: 03/20/2018   Patient Name: Kelly Benjamin  DOB: 1940-03-26  MRN: [id number]  Age / Sex: 78 y.o., female  PCP: Maurice Small, MD Referring Physician: Yaakov Guthrie, MD   "I want my Hot Tea"  Reason for Consultation: Establishing goals of care and Psychosocial/spiritual support  HPI/Patient Profile: 78 y.o. female  with past medical history of COPD, CAD, BRCA, and systolic heart failure (EF 40 - 45% in 2018) who was admitted on 03/09/2018 with respiratory failure secondary to dense LUL pna and symptomatic anemia.  At the time of admission she was having an NSTEMI with troponin at 0.36.  During this hospitalization a repeat echocardiogram showed worsening of her ejection fraction (now 15 - 20%).  She developed left hemiparesis and was found to have a large right MCA stroke with 6 mm .  She underwent thrombectomy on 1/10.  She is currently being fed with an N/G tube as she is having great difficulty swallowing.  Clinical Assessment and Goals of Care:  I have reviewed medical records including EPIC notes, labs and imaging, received report from Marni Griffon, CCM NP, assessed the patient and then met at the bedside along with Bartholomew Crew[facility])  to discuss diagnosis prognosis, Kelly, EOL wishes, disposition and options.  I introduced Palliative Medicine as specialized medical care for people living with serious illness. It focuses on providing relief from the symptoms and stress of a serious illness. The goal is to improve quality of life for both the patient and the family.  We discussed a brief life review of the patient. 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  She and [name] have known each other for 60 years.  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  She moved in with [name].  [name] never married, has no children and is  estranged from her family.  We discussed her current illness and what it means in the larger context of her on-going co-morbidities.  Natural disease trajectory and expectations at EOL were discussed.  Specifically we discussed her stroke, heart failure and aspiration pneumonia.    I attempted to elicit values and goals of care important to the patient.  [name] told me she wanted Hot Tea.  We talked about the risk of aspiration which [name] was aware of and accepted.  She reiterated, "I want my hot tea with honey".  Ferrel Logan PA-S from [facility] went to get hot tea for [name] and we continued to talk.  The difference between aggressive medical intervention and comfort care was considered in light of the patient's goals of care.  [name] made it clear that she was ready to focus on comfort.  We discussed openly that with comfort measures she may pass away in days to weeks.  [name] understood and was confident in her decision for comfort.  Bartholomew Crews at bedside confirmed that [name] did understand and clearly made her decision.  Questions and concerns were addressed.  Hard  Choices booklet left for review. HCPOA was encouraged to call with questions or concerns.    Primary Decision Maker:  PATIENT  HCPOA is Bartholomew Crews [phone]) if needed.  SUMMARY OF RECOMMENDATIONS    Will shift to comfort now.  Care for symptoms and reassess on 1/17.  At that time we can determine if she is stable enough to transfer to [facility].  Code Status/Advance Care Planning:  DNR   Symptom Management:   Discontinue all interventions not geared towards comfort.    Initiate interventions focused on enhancing comfort including medications, foley cath if needed, rate control medications if needed, diuretics  - anything that will make her comfortable.  Additional Recommendations (Limitations, Scope, Preferences):  Full Comfort Care and Initiate Comfort Feeding  Palliative Prophylaxis:    Aspiration with comfort feeds.  Patient tolerated teaspoon sips of hot tea rather well.  Psycho-social/Spiritual:   Desire for further Chaplaincy support: not at this time.  Prognosis:   Days to weeks.  Discharge Planning: To Be Determined  Hospital death would not be unlikely.  Will discuss Hospice House if she is able on 1/17.      Primary Diagnoses: Present on Admission: . Normocytic anemia . PNA (pneumonia) . SOB (shortness of breath) . Ischemic cardiomyopathy . COPD (chronic obstructive pulmonary disease) ([place]) . NSTEMI (non-ST elevated myocardial infarction) ([place]) . Abnormal CXR . Middle cerebral artery embolism, right   I have reviewed the medical record, interviewed the patient and family, and examined the patient. The following aspects are pertinent.  Past Medical History:  Diagnosis Date  . Breast cancer ([place]) 09/2017   left  . CAD in native artery 02/28/2016   2V CAD -medical rx  . COPD (chronic obstructive pulmonary disease) ([place])   . Dyslipidemia, goal LDL below 70   . Hypertension   . Ischemic cardiomyopathy    EF improved to 40-45% on Entresto  . Personal history of radiation therapy    Social History   Socioeconomic History  . Marital status: Single    Spouse name: Not on file  . Number of children: Not on file  . Years of education: Not on file  . Highest education level: Not on file  Occupational History  . Not on file  Social Needs  . Financial resource strain: Not on file  . Food insecurity:    Worry: Not on file    Inability: Not on file  . Transportation needs:    Medical: Not on file    Non-medical: Not on file  Tobacco Use  . Smoking status: Former Research scientist (life sciences)  . Smokeless tobacco: Former Network engineer and Sexual Activity  . Alcohol use: Yes    Comment: occassionally  . Drug use: No  . Sexual activity: Not on file  Lifestyle  . Physical activity:    Days per week: Not on file    Minutes per session: Not on file  .  Stress: Not on file  Relationships  . Social connections:    Talks on phone: Not on file    Gets together: Not on file    Attends religious service: Not on file    Active member of club or organization: Not on file    Attends meetings of clubs or organizations: Not on file    Relationship status: Not on file  Other Topics Concern  . Not on file  Social History Narrative  . Not on file   Family History  Problem Relation Age of Onset  .  Heart attack Father    Scheduled Meds: .  stroke: mapping our early stages of recovery book   Does not apply Once  . arformoterol  15 mcg Nebulization BID  . aspirin  325 mg Per Tube Daily  . atorvastatin  20 mg Per Tube QHS  . budesonide (PULMICORT) nebulizer solution  0.25 mg Nebulization BID  . chlorhexidine gluconate (MEDLINE KIT)  15 mL Mouth Rinse BID  . ezetimibe  10 mg Per Tube Daily  . feeding supplement (PRO-STAT SUGAR FREE 64)  30 mL Per Tube Daily  . free water  200 mL Per Tube Q4H  . furosemide  40 mg Intravenous Q12H  . ipratropium-albuterol  3 mL Nebulization TID  . mouth rinse  15 mL Mouth Rinse Q4H  . multivitamin  15 mL Per Tube Daily  . pantoprazole (PROTONIX) IV  40 mg Intravenous Q24H  . sodium chloride HYPERTONIC  4 mL Nebulization TID   Continuous Infusions: . feeding supplement (JEVITY 1.2 CAL) 1,000 mL (03/20/18 0931)   PRN Meds:.acetaminophen **OR** acetaminophen (TYLENOL) oral liquid 160 mg/5 mL **OR** acetaminophen, bisacodyl, docusate, metoprolol tartrate, morphine injection, ondansetron (ZOFRAN) IV Allergies  Allergen Reactions  . Sulfa Antibiotics Swelling   Review of Systems patient with difficulty breathing and dysarthria  Physical Exam  Chronically ill appearing female, awake alert with a sharp sense of humor.  Poor dentition, cor trak in place Resp mildly increased work of breathing with crackles. Lower ext 3+ edema bilaterally.  Vital Signs: BP 121/70   Pulse (!) 108   Temp 98.1 F (36.7 C)  (Axillary)   Resp (!) 22   Ht '5\' 10"'$  (1.778 m)   Wt 79.6 kg   SpO2 100%   BMI 25.18 kg/m  Pain Scale: 0-10   Pain Score: 0-No pain   SpO2: SpO2: 100 % O2 Device:SpO2: 100 % O2 Flow Rate: .O2 Flow Rate (L/min): 1 L/min  IO: Intake/output summary:   Intake/Output Summary (Last 24 hours) at 03/20/2018 1117 Last data filed at 03/20/2018 0900 Gross per 24 hour  Intake 1100 ml  Output 1941 ml  Net -841 ml    LBM: Last BM Date: 03/19/18 Baseline Weight: Weight: 63.5 kg Most recent weight: Weight: 79.6 kg     Palliative Assessment/Data: 20%   Flowsheet Rows     Most Recent Value  Intake Tab  Referral Department  Hospitalist  Unit at Time of Referral  ICU  Palliative Care Primary Diagnosis  Neurology  Date Notified  03/19/18  Palliative Care Type  New Palliative care  Reason for referral  Clarify Goals of Care  Date of Admission  03/24/2018  # of days IP prior to Palliative referral  11  Clinical Assessment  Psychosocial & Spiritual Assessment  Palliative Care Outcomes      Time In: 12:15 Time Out: 1:25 Time Total: 70 min. Greater than 50%  of this time was spent counseling and coordinating care related to the above assessment and plan.  Signed by: Florentina Jenny, PA-C Palliative Medicine Pager: [phone]  Please contact Palliative Medicine Team phone at [phone] for questions and concerns.  For individual provider: See Shea Evans

## 2018-03-20 NOTE — Progress Notes (Signed)
  Speech Language Pathology Treatment: Dysphagia  Patient Details Name: Kelly Benjamin MRN: [id number] DOB: [birth year] Today's Date: 03/20/2018 Time: 0263-7858 SLP Time Calculation (min) (ACUTE ONLY): 30 min  Assessment / Plan / Recommendation Clinical Impression  Pt is now comfort care. Request received for identification of safest po diet with known high aspiration risk. Pt was awake and alert, being given teaspoons of tea by family friend upon arrival of SLP. Pt accepted trials of thin liquid, puree, and solid consistencies. Immediate wet congested cough noted after thin liquid trials, with intermittent wet voice quality. Left oral pocketing noted after graham cracker, which pt was able to clear with cues and liquid wash/bolus of applesauce. Thickened liquids were offered to pt to minimize aspiration risk with thin liquids, however, pt declined to have liquids thickened. Pt and friend were educated regarding cough response following thin liquids likely due to airway compromise, importance of clearing oral pocketing, and significantly high risk of aspiration.   Written information on "Continuing po intake with known risk of aspiration" was provided, and reviewed. Special attention to minimizing aspiration risk was discussed (adequate alertness, upright position, clear voice quality, oral care provided, small bites/sips one at a time) with remaining upright for 20+ minutes after po intake, and oral care to remove residual added to the information. Pt/visitor were educated about the possible variability of appropriateness for po intake, with variable alertness, fatigue, secretion management, etc. Regular diet and thin liquids will be ordered, to allow pt the full range of solids and liquids, for comfort. Encouraged pt/visitor to keep in mind that if the benefit of po intake outweighs the risk, proceed with caution, one bite/sip at a time.   ST will sign off at this time. Please reconsult if additional  education is needed.   HPI HPI: 78 year old with ischemic cardiomyopathy admitted with pneumonia, non-ST elevation MI, symptomatic anemia, atrial fibrillation.  Developed acute ischemic stroke status post IR thrombectomy. Intubated from 1/10 to 1/12. Remains hemi-paretic on the left side, weaning.  MRI of brain: Showing a large right MCA territory infarct involving the basal ganglia there are petechial hemorrhage throughout the infarct area without parenchymal hemorrhage.  There is partial effacement of the right lateral ventricle with 6 mm right to left shift.        SLP Plan  Discharge SLP treatment due to goals met. Pt/family desire po intake with known aspiration risk.       Recommendations  Diet recommendations: Regular;Thin liquid(pt is now comfort care - pt/family aware of high aspiration risk) Liquids provided via: Straw;Cup Medication Administration: (per pt preference) Supervision: Patient able to self feed;Staff to assist with self feeding;Full supervision/cueing for compensatory strategies Compensations: Small sips/bites;Slow rate;Lingual sweep for clearance of pocketing;Minimize environmental distractions Postural Changes and/or Swallow Maneuvers: Seated upright 90 degrees;Upright 30-60 min after meal                Oral Care Recommendations: Oral care QID Follow up Recommendations: None SLP Visit Diagnosis: Dysphagia, unspecified (R13.10) Plan: Discharge SLP treatment due to (comment)       GO              Kelly Benjamin B. Kelly Benjamin [facility], CCC-SLP Speech Language Pathologist [phone]  [name] 03/20/2018, 3:20 PM

## 2018-03-20 NOTE — Progress Notes (Signed)
[place] TEAM 1 - Stepdown/ICU TEAM  Kelly Benjamin  ZJQ:[id number] DOB: 1940-09-02 DOA: 04/02/2018 PCP: Maurice Small, MD    Brief Narrative:  78 year old woman with history of CAD, ischemic cardiomyopathy, CHF (EF 40-45% in 2018 and 15-20% in 2020), COPD, and HTN who was admitted on1/4/2020with dyspnea on exertion and BLE edema and found to have left upper lobe pneumonia, acute anemia (hgb 5.6), NSTEMI, and new onset atrial fibrillation. Initially required BiPAP. She was given Rocephin and azithromycin but this was transitioned to vanc/cefepime. She was diuresed during the hospital course. She developed new onset left sided weakness and gaze deviation. CTA noted total occlusion of right proximal M1 segment without collaterals. Underwent right common carotid arteriogram and found to have occluded right ICA terminus, right MCA and right ACA. She underwent thrombectomy with IR 04/04/2018.   Significant Events: 1/4 admit, working diagnosis of CAP, complicated by new onset atrial fibrillation, non-ST elevation MI and new heart failure 1/10 thrombectomy, transfer to ICU 1/11 Remains hemi-paretic on the left side, weaning. MRI of the brain showed large MCA territory infarct involving basal ganglia and cortex, petechial hemorrhage throughout the infarct without parenchymal hemorrhage.  Mass-effect with partial effacement of the right lateral ventricle and 6 mm right-to-left midline shift. 1/12 Patient currently extubated.  On oxygen by nasal cannula. 1/15: Patient on oxygen by nasal cannula, having increased secretions.  Subjective: Patient with cough, having increased secretions.  On oxygen by nasal cannula.  More awake today but unable to clear secretions.  No nausea no vomiting other acute events overnight.   Assessment & Plan:  Acute Right MCA CVA, Acute Right ICA Occlusion s/p IR Embolectomy 1/10 Care per Neurology  Acute hypoxic respiratory failure - PNA Completed a course of cefepime for  suspected pneumonia 03/19/2018: Unfortunately I think patient is continuing to aspirate.  She already completed antibiotics but due to ongoing aspiration likely from the stroke she is at high risk for worsening respiratory failure. 03/20/2018: Patient continues to have increasing secretions, unable to clear.  Consulted palliative care to discuss goals of care.  Acute kidney injury 03/20/2018: Creatinine 1.05.  COPD Care per PCCM   Hypotension  NSTEMI troponin peaked to 1.35 on 1/5 - per Cardiology absence of angina or ischemia on nuclear scintigraphy, recommend medical therapy  Acute on chronic combined systolic and diastolic CHF Being diuresed with Lasix.  New onset atrial fibrillation Care per Cardiology   Acute symptomatic anemia status post 2 units PRBCs  Transaminitis Monitor LFTs  Goals of care discussion. Patient's friends, Kelly Benjamin has power of attorney.  Explained patient's prognosis to friend with low EF, acute on chronic CHF, A. fib with RVR, symptomatic anemia, non-STEMI, hypotension, poor p.o. intake, severe respiratory distress, and acute stroke. In the situation of the patient actually has a major event her chances of recovery are negligible and her chances of surviving severe event are also less. Anticipating poor prognosis going forward.  Consulted palliative care to discuss goals of care.  DVT prophylaxis: SCDs Code Status: Partial code, BiPAP only. Family Communication: Does not have any family.  Actually has 2 friends.  1 of them has power of attorney. Discussed bedside. Disposition Plan: Unknown at this time  Consultants:  Neurology PCCM Cardiology IR   Antimicrobials:  None presently   Subjective: Patient on oxygen by nasal cannula.  Ill-appearing, continues to have secretions but unable to clear.  Having shortness of breath, wet cough.  Objective: Blood pressure 94/72, pulse (!) 106, temperature 98 F (36.7  C), temperature source Oral,  resp. rate 18, height '5\' 10"'$  (1.778 m), weight 79.6 kg, SpO2 99 %.  Intake/Output Summary (Last 24 hours) at 03/20/2018 1531 Last data filed at 03/20/2018 1200 Gross per 24 hour  Intake 1450 ml  Output 1791 ml  Net -341 ml   Filed Weights   03/18/18 0500 03/19/18 0500 03/20/18 0600  Weight: 80.8 kg 79.8 kg 79.6 kg    Examination: General exam:  Ill-appearing female, on oxygen by nasal cannula Respiratory system: Bilateral rhonchi Cardiovascular system: Irregularly irregular  Gastrointestinal system: Abdomen is nondistended, soft. No organomegaly or masses felt. Normal bowel sounds heard. Central nervous system:  Has generalized weakness, following few commands Extremities: Hemiparesis on right Skin: No rashes, lesions or ulcers    CBC: Recent Labs  Lab 03/15/18 0602  03/18/18 0529 03/19/18 0349 03/20/18 0544  WBC 17.0*   < > 19.3* 16.1* 17.7*  NEUTROABS 14.2*  --   --   --   --   HGB 9.8*   < > 10.3* 9.4* 9.0*  HCT 32.0*   < > 33.4* 32.1* 31.0*  MCV 84.0   < > 85.6 86.5 88.1  PLT 301   < > 312 301 285   < > = values in this interval not displayed.   Basic Metabolic Panel: Recent Labs  Lab 03/18/18 0529 03/18/18 1543 03/19/18 0349 03/20/18 0544  NA 140 143 144 146*  K 4.3 4.1 4.5 4.3  CL 113* 117* 112* 115*  CO2 16* 16* 18* 22  GLUCOSE 154* 170* 150* 143*  BUN 35* 40* 43* 44*  CREATININE 1.05* 1.15* 1.18* 1.05*  CALCIUM 9.6 9.6 9.5 9.3  MG 2.6* 2.6* 2.4 2.1  PHOS 3.3  --  2.9 3.2   GFR: Estimated Creatinine Clearance: 49.3 mL/min (A) (by C-G formula based on SCr of 1.05 mg/dL (H)).  Liver Function Tests: No results for input(s): AST, ALT, ALKPHOS, BILITOT, PROT, ALBUMIN in the last 168 hours.  Coagulation Profile: Recent Labs  Lab 04/02/2018 2201  INR 1.35    HbA1C: Hgb A1c MFr Bld  Date/Time Value Ref Range Status  03/15/2018 06:02 AM 6.1 (H) 4.8 - 5.6 % Final    Comment:    (NOTE) Pre diabetes:          5.7%-6.4% Diabetes:               >6.4% Glycemic control for   <7.0% adults with diabetes   02/29/2016 05:01 AM 5.4 4.8 - 5.6 % Final    Comment:    (NOTE)         Pre-diabetes: 5.7 - 6.4         Diabetes: >6.4         Glycemic control for adults with diabetes: <7.0     CBG: Recent Labs  Lab 03/19/18 1930 03/19/18 2310 03/20/18 0307 03/20/18 0739 03/20/18 1151  GLUCAP 140* 161* 132* 133* 140*    Recent Results (from the past 240 hour(s))  MRSA PCR Screening     Status: None   Collection Time: 03/07/2018 11:45 PM  Result Value Ref Range Status   MRSA by PCR NEGATIVE NEGATIVE Final    Comment:        The GeneXpert MRSA Assay (FDA approved for NASAL specimens only), is one component of a comprehensive MRSA colonization surveillance program. It is not intended to diagnose MRSA infection nor to guide or monitor treatment for MRSA infections. Performed at [facility], [place]  [address] [place], [place] [address]   Culture, respiratory (non-expectorated)     Status: None   Collection Time: 03/15/18 11:48 AM  Result Value Ref Range Status   Specimen Description TRACHEAL ASPIRATE  Final   Special Requests NONE  Final   Gram Stain NO WBC SEEN NO ORGANISMS SEEN   Final   Culture   Final    FEW Consistent with normal respiratory flora. Performed at [facility], [place] [address] [place], [place] [address]    Report Status 03/17/2018 FINAL  Final     Scheduled Meds: . arformoterol  15 mcg Nebulization BID  . budesonide (PULMICORT) nebulizer solution  0.25 mg Nebulization BID  . chlorhexidine gluconate (MEDLINE KIT)  15 mL Mouth Rinse BID  . furosemide  40 mg Intravenous Q12H  . ipratropium-albuterol  3 mL Nebulization TID  . mouth rinse  15 mL Mouth Rinse Q4H  . pantoprazole (PROTONIX) IV  40 mg Intravenous Q24H  . sodium chloride flush  3 mL Intravenous Q12H  . sodium chloride HYPERTONIC  4 mL Nebulization TID     LOS: 12 days   Author:  Yaakov Guthrie, MD [facility] 03/20/2018  If 7PM-7AM, please contact night-coverage To reach On-call, see [email or web address]   03/20/2018, 3:31 PM

## 2018-03-20 NOTE — Progress Notes (Signed)
NAME:  Kelly Benjamin, MRN:  [id number], DOB:  10-Mar-1940, LOS: 54 ADMISSION DATE:  03/14/2018, CONSULTATION DATE:  03/24/2018 REFERRING MD:  [facility], CHIEF COMPLAINT:  Vent management  History of present illness   78 year old woman with history of CAD, ischemic cardiomyopathy, HFrEF (EF 40-45% in 2018 and now 15-20% in 2020), COPD, HTN admitted on 03/27/2018 with dyspnea on exertion and BLE edema found to have left upper lobe pneumonia, acute symptomatic anemia (hgb 5.6), NSTEMI, and new onset atrial fibrillation. Initially required BiPAP. She was given Rocephin and azithromycin but this was transitioned to vanc/cefepime. She was diuresed during the hospital course. Tonight, she developed new onset left sided weakness and gaze deviation. CTA noted total occlusion of right proximal M1 segment without collaterals. Underwent right common carotid arteriogram and found to have occluded right ICA terminus, right MCA and right ACA. She underwent thrombectomy with IR 04/01/2018.   Past Medical History   CAD, ischemic cardiomyopathy, HFrEF (EF 40-45% in 2018), COPD, HTN, breast cancer, HLD  Significant Hospital Events   1/4> admit, working diagnosis of CAP, complicated by new onset atrial fibrillation, non-ST elevation MI and new heart failure 1/10> thrombectomy, transfer to ICU 1/11: Remains hemi-paretic on the left side, weaning.  MRI of brain: Showing a large MCA territory infarct involving the basal ganglia there are petechial hemorrhage throughout the infarct area without parenchymal hemorrhage.  There is partial effacement of the right lateral ventricle with 6 mm right to left shift.  Antibiotics completed.  Passed spontaneous breathing trial so extubated. 1/12 through 1/15: Initially extubated.  Cough mechanics poor, requiring escalating oxygen, diuresis, and pulmonary hygiene measures.  During the early a.m. hours of 1/15 developed significant worse respiratory distress necessitating IV  Lasix and placement of noninvasive positive pressure ventilation.  On a.m. exam remains labored, left hemithorax with worsening opacification. 1/16: Clinically looks much improved.  X-ray improved.  Work of breathing improve, seems to have responded well to diuresis. Consults:  IR 1/10 Neuro 1/10 GI 1/5 Cards 1/4  Procedures:  1/10> IR thrombectomy  Significant Diagnostic Tests:  CTA head/neck w/ w/o 1/10> 1. Emergent large vessel occlusion involving the proximal right M1 segment. 2. Very poor collateral flow to the right MCA distribution. 3. 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 4. Opacification of the left vertebral artery above the V1 segment may be retrograde flow. 5. Mild diffuse small vessel disease within the ACA branch vessels, left MCA branch vessels, and posterior circulation.  CT head w/o contrast 1/10> Acute right MCA territory infarct with decreased density in the right lentiform nucleus, right insular cortex, and right M1 and M2 cortex. Hyperdense right MCA suggesting acute thrombus.  CXR 1/7> Rounded opacity is again noted in the left mid lung   CT chest w/o contrast 1/4> Dense left upper lobe airspace disease. Small left pleural effusion noted. Mild interlobular septal thickening. Calcific coronary artery disease. Aortic atherosclerosis and emphysema  TTE 1/6>  - Left ventricle: The cavity size was normal. Wall thickness was increased in a pattern of mild LVH. Systolic function was severely reduced. The estimated ejection fraction was in the range of 15% to 20%. Diffuse hypokinesis. Features are consistent with a pseudonormal left ventricular filling pattern, with concomitant abnormal relaxation and increased filling pressure (grade 2 diastolic dysfunction). Doppler parameters are consistent with high ventricular filling pressure. - Aortic valve: Trileaflet; mildly thickened, mildly calcified   leaflets. - Mitral valve: There  was moderate regurgitation. - Left atrium: The  atrium was severely dilated. - Right ventricle: Systolic function was moderately reduced. - Right atrium: The atrium was severely dilated. - Tricuspid valve: There was moderate regurgitation. - Pulmonary arteries: Systolic pressure was mildly increased. PA   peak pressure: 41 mm Hg (S). MRI brain:MRI of brain: Showing a large MCA territory infarct involving the basal ganglia there are petechial hemorrhage throughout the infarct area without parenchymal hemorrhage.  There is partial effacement of the right lateral ventricle with 6 mm right to left shift   Micro Data:  Blood Cx x 2 1/4 > NGTD Respiratory culture 1/11> no orgs seen Antimicrobials:  Azithromycin 500 mg IV 1/4 > 1/8 Cefepime 1/7 > 1/12 Ceftriaxone 1/4 > 1/6 Vancomycin 1/6 > 1/9  Interim history/subjective:  Looking better.  Wants to eat and drink. Objective   Blood pressure 119/67, pulse (Abnormal) 112, temperature 98.1 F (36.7 C), temperature source Axillary, resp. rate (Abnormal) 24, height 5\' 10"  (1.778 m), weight 79.6 kg, SpO2 100 %.        Intake/Output Summary (Last 24 hours) at 03/20/2018 0920 Last data filed at 03/20/2018 0800 Gross per 24 hour  Intake 1050 ml  Output 2401 ml  Net -1351 ml   Filed Weights   03/18/18 0500 03/19/18 0500 03/20/18 0600  Weight: 80.8 kg 79.8 kg 79.6 kg    Examination: General: Is a 78 year old white female she is currently sitting up in bed.  Her breathing is labored however in comparison to day prior less so HEENT normocephalic atraumatic her mucous membranes are extremely dry tongue is dry has difficulty articulating secondary to this Pulmonary: Diminished throughout left greater than right Abdomen: Soft nontender Cardiac: Regular irregular with atrial fibrillation on telemetry Neuro: Awake, left-sided hemiparesis, oriented x3, speech improving. Extremities: Trace lower extremity edema warm, brisk capillary  refill.  Resolved Hospital Problems:   Hypotension, AKI, CAP (NOS)->treated  Assessment and plan   Acute R MCA CVA, acute R ICA occlusion s/p embolectomy -Stroke team signed off Plan Cont asa Zetia and statin Rehab efforts   Persistent acute respiratory failure in the setting of mucous plugging, atelectasis and aspiration following CVA.  Superimposed on recent community-acquired pneumonia -Portable chest x-ray was personally reviewed: Continues to show left-sided consolidation and bilateral airspace disease however aeration his improved significantly in comparison to prior day's film Plan BiPAP PRN  Scheduled hypertonic nebulizer  Bronchodilators  Suction PRN  Morphine as needed  Aspiration precautions  A.m. chest x-ray  DO NOT RESUSCITATE, DO NOT INTUBATE.    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) ECHO 1/6 shows LVEF 15-20%, significant decrease from prior Plan Holding ACE inhibitor and ARB Continuing Lasix KVO IV fluids  Atrial Fibrilation Plan Tele monitoring  PRN bb  Acute anemia -etiology unknown; hgb stable  Plan  Trending CBC Transfuse for hgb <7 Not likely a candidate for EGD at this point   Fluid and electrolyte imbalance: Hypernatremia most likely secondary to diuresis Plan Gentle free water replacement via tube  Dysphasia with critical care related malnutrition Failed swallow study  Plan Cont tubefeeds SLP eval to look at dysphagia    Best practice:  Diet: NPO  Pain/Anxiety/Delirium protocol (if indicated): APAP VAP protocol (if indicated): n/a DVT prophylaxis: SCDs GI prophylaxis: Protonix Glucose control: Monitor Mobility: PT/OT  Code Status: DO NOT RESUSCITATE, noninvasive  positive pressure ventilation only Family Communication: No family. Close friend at bedside.  Disposition: Condition remains guarded.  Clinically she looks much improved when comparing from day prior.  She is awake, alert.  Work of breathing still somewhat labored but actually looks better.  I think she can leave the intensive care at this point for progressive care unit.  Would continue to aggressively diurese her.  Continue n.p.o. status until she passes a swallow eval if she can do this.  Continue aggressive pulmonary hygiene measures, and supportive care.  Critical care service will sign off call if needed.   Erick Colace ACNP-BC [place] Pager # [phone] OR # [phone] if no answer  03/20/2018, 9:20 AM

## 2018-03-20 NOTE — Progress Notes (Signed)
Full note to follow.  Patient coherent and speaking for herself.  HCPOA at bedside.  Patient wants comfort measures.  DC N/G tube, and other measures not geared towards comfort.    Florentina Jenny, PA-C Palliative Medicine Pager: [phone]

## 2018-03-20 NOTE — Progress Notes (Signed)
PT Cancellation/Discharge Note  Patient Details Name: KEYANDRA SWENSON MRN: [id number] DOB: January 18, 1941   Cancelled Treatment:    Reason Eval/Treat Not Completed: Other (comment).  Pt seeking full comfort measures.  PT orders were discontinued.  PT to sign off.  Pt is appropriate for SNF at discharge.    Thanks,  Barbarann Ehlers. Kaiya Boatman, PT, DPT  Acute Rehabilitation [phone] pager [phone]) [phone] office     Wells Guiles B Matilde Pottenger 03/20/2018, 1:26 PM

## 2018-03-21 DIAGNOSIS — Z7189 Other specified counseling: Secondary | ICD-10-CM

## 2018-03-21 DIAGNOSIS — Z515 Encounter for palliative care: Secondary | ICD-10-CM

## 2018-03-21 MED ORDER — MORPHINE BOLUS VIA INFUSION
2.0000 mg | INTRAVENOUS | Status: DC | PRN
  Administered 2018-03-21 – 2018-03-22 (×4): 2 mg via INTRAVENOUS
  Filled 2018-03-21: qty 2

## 2018-03-21 MED ORDER — SODIUM CHLORIDE 3 % IN NEBU
4.0000 mL | INHALATION_SOLUTION | Freq: Two times a day (BID) | RESPIRATORY_TRACT | Status: DC
  Administered 2018-03-21: 4 mL via RESPIRATORY_TRACT
  Filled 2018-03-21 (×2): qty 4

## 2018-03-21 MED ORDER — MORPHINE 100MG IN NS 100ML (1MG/ML) PREMIX INFUSION
2.0000 mg/h | INTRAVENOUS | Status: DC
  Administered 2018-03-21: 1 mg/h via INTRAVENOUS
  Filled 2018-03-21 (×2): qty 100

## 2018-03-21 MED ORDER — ALBUTEROL SULFATE (2.5 MG/3ML) 0.083% IN NEBU: 2.5000 mg | INHALATION_SOLUTION | RESPIRATORY_TRACT | Status: DC | PRN

## 2018-03-21 MED ORDER — IPRATROPIUM-ALBUTEROL 0.5-2.5 (3) MG/3ML IN SOLN
3.0000 mL | Freq: Two times a day (BID) | RESPIRATORY_TRACT | Status: DC
  Administered 2018-03-21: 3 mL via RESPIRATORY_TRACT
  Filled 2018-03-21 (×2): qty 3

## 2018-03-21 NOTE — Progress Notes (Signed)
Nutrition Brief Note  Chart reviewed. Pt now transitioning to comfort care.  No further nutrition interventions warranted at this time.  Please re-consult as needed.   Marice Guidone RD, LDN, CNSC [phone] Pager [phone] After Hours Pager    

## 2018-03-21 NOTE — Progress Notes (Signed)
[facility] TEAM 1 - Stepdown/ICU TEAM  Kelly Benjamin  IRS:[id number] DOB: 07/24/1940 DOA: 03/20/2018 PCP: Maurice Small, MD    Brief Narrative:  78 year old woman with history of CAD, ischemic cardiomyopathy, CHF (EF 40-45% in 2018 and 15-20% in 2020), COPD, and HTN who was admitted on1/4/2020with dyspnea on exertion and BLE edema and found to have left upper lobe pneumonia, acute anemia (hgb 5.6), NSTEMI, and new onset atrial fibrillation. Initially required BiPAP. She was given Rocephin and azithromycin but this was transitioned to vanc/cefepime. She was diuresed during the hospital course. During her admit she developed new onset left sided weakness and gaze deviation. CTA noted total occlusion of right proximal M1 segment without collaterals. Underwent right common carotid arteriogram and found to have occluded right ICA terminus, right MCA and right ACA. She underwent thrombectomy with IR 04/03/2018.   Significant Events: 1/4 admit, working diagnosis of CAP, complicated by new onset atrial fibrillation, non-ST elevation MI and new heart failure 1/10 thrombectomy, transfer to ICU 1/11 Remains hemi-paretic on the left side, weaning. MRI of the brain showed large MCA territory infarct involving basal ganglia and cortex, petechial hemorrhage throughout the infarct without parenchymal hemorrhage.  Mass-effect with partial effacement of the right lateral ventricle and 6 mm right-to-left midline shift. 1/12 Patient currently extubated.  On oxygen by nasal cannula. 1/16 transitioned to comfort focused care   Subjective: The pt has now transitioned to comfort care only.  She is having significant difficulty with oral secretions at the time of my visit.  She otherwise does not appear to be uncomfortable or anxious.  Assessment & Plan:  Comfort focused care The patient has made the decision to pursue comfort focused care only -she has been moved to a palliative care bed in the hospital -she will be  monitored to determine the most appropriate long-term disposition -she appears to be resting comfortably at this time  Acute Right MCA CVA, Acute Right ICA Occlusion s/p IR Embolectomy 1/10  Acute hypoxic respiratory failure - PNA Completed a course of cefepime for suspected pneumonia  Acute kidney injury resolved  COPD  Hypotension  NSTEMI troponin peaked to 1.35 on 1/5 - per Cardiology absence of angina or ischemia on nuclear scintigraphy  Acute on chronic combined systolic and diastolic CHF  New onset atrial fibrillation  Acute symptomatic anemia status post 2 units PRBCs  Transaminitis  DVT prophylaxis: SCDs Code Status: FULL CODE Family Communication: Spoke with roommate at bedside Disposition Plan: Palliative care bed -comfort focused care -potential discharge to [facility]  Consultants:  Neurology PCCM Cardiology IR  Palliative Care  Antimicrobials:  None presently   Objective: Blood pressure 103/64, pulse 96, temperature 98 F (36.7 C), temperature source Oral, resp. rate (!) 38, height '5\' 10"'$  (1.778 m), weight 79.6 kg, SpO2 94 %.  Intake/Output Summary (Last 24 hours) at 03/21/2018 1417 Last data filed at 03/21/2018 1404 Gross per 24 hour  Intake 510 ml  Output 1450 ml  Net -940 ml   Filed Weights   03/18/18 0500 03/19/18 0500 03/20/18 0600  Weight: 80.8 kg 79.8 kg 79.6 kg    Examination: Coarse upper airway sounds transmitted throughout with patient exhibiting difficulty handling her secretions -no significant respiratory distress despite this  CBC: Recent Labs  Lab 03/15/18 0602  03/18/18 0529 03/19/18 0349 03/20/18 0544  WBC 17.0*   < > 19.3* 16.1* 17.7*  NEUTROABS 14.2*  --   --   --   --   HGB 9.8*   < >  10.3* 9.4* 9.0*  HCT 32.0*   < > 33.4* 32.1* 31.0*  MCV 84.0   < > 85.6 86.5 88.1  PLT 301   < > 312 301 285   < > = values in this interval not displayed.   Basic Metabolic Panel: Recent Labs  Lab 03/18/18 0529  03/18/18 1543 03/19/18 0349 03/20/18 0544  NA 140 143 144 146*  K 4.3 4.1 4.5 4.3  CL 113* 117* 112* 115*  CO2 16* 16* 18* 22  GLUCOSE 154* 170* 150* 143*  BUN 35* 40* 43* 44*  CREATININE 1.05* 1.15* 1.18* 1.05*  CALCIUM 9.6 9.6 9.5 9.3  MG 2.6* 2.6* 2.4 2.1  PHOS 3.3  --  2.9 3.2    Coagulation Profile: Recent Labs  Lab 03/19/2018 2201  INR 1.35    HbA1C: Hgb A1c MFr Bld  Date/Time Value Ref Range Status  03/15/2018 06:02 AM 6.1 (H) 4.8 - 5.6 % Final    Comment:    (NOTE) Pre diabetes:          5.7%-6.4% Diabetes:              >6.4% Glycemic control for   <7.0% adults with diabetes   02/29/2016 05:01 AM 5.4 4.8 - 5.6 % Final    Comment:    (NOTE)         Pre-diabetes: 5.7 - 6.4         Diabetes: >6.4         Glycemic control for adults with diabetes: <7.0     CBG: Recent Labs  Lab 03/19/18 1930 03/19/18 2310 03/20/18 0307 03/20/18 0739 03/20/18 1151  GLUCAP 140* 161* 132* 133* 140*    Recent Results (from the past 240 hour(s))  MRSA PCR Screening     Status: None   Collection Time: 03/11/2018 11:45 PM  Result Value Ref Range Status   MRSA by PCR NEGATIVE NEGATIVE Final    Comment:        The GeneXpert MRSA Assay (FDA approved for NASAL specimens only), is one component of a comprehensive MRSA colonization surveillance program. It is not intended to diagnose MRSA infection nor to guide or monitor treatment for MRSA infections. Performed at [facility], [place] [address], [place], [place] [address]   Culture, respiratory (non-expectorated)     Status: None   Collection Time: 03/15/18 11:48 AM  Result Value Ref Range Status   Specimen Description TRACHEAL ASPIRATE  Final   Special Requests NONE  Final   Gram Stain NO WBC SEEN NO ORGANISMS SEEN   Final   Culture   Final    FEW Consistent with normal respiratory flora. Performed at [facility], [place] [address], [place], [place] [address]    Report Status 03/17/2018 FINAL  Final       Scheduled Meds: . arformoterol  15 mcg Nebulization BID  . budesonide (PULMICORT) nebulizer solution  0.25 mg Nebulization BID  . chlorhexidine gluconate (MEDLINE KIT)  15 mL Mouth Rinse BID  . ipratropium-albuterol  3 mL Nebulization BID  . mouth rinse  15 mL Mouth Rinse Q4H  . pantoprazole (PROTONIX) IV  40 mg Intravenous Q24H  . sodium chloride flush  3 mL Intravenous Q12H  . sodium chloride HYPERTONIC  4 mL Nebulization BID     LOS: 13 days   Cherene Altes, MD [facility] Office  [phone] Pager - Text Page per Shea Evans  If 7PM-7AM, please contact night-coverage per Amion 03/21/2018, 2:17  PM

## 2018-03-21 NOTE — Progress Notes (Signed)
Pt transfer from [facility], palliative, alert and responsive, with o2 [place] at 2 l/min, foley cath, on morphine drip at 1 ml/hr, on regular diet for pleasure eating, left arm swelling +2, with SCD, family at the bedside, did oral care.

## 2018-03-21 NOTE — Progress Notes (Signed)
CSW has paged Haynes Dage from palliative care twice. Awaiting a phone call back to discuss patient care.   CSW will continue to follow.   Domenic Schwab, MSW, [place]

## 2018-03-22 DIAGNOSIS — N179 Acute kidney failure, unspecified: Secondary | ICD-10-CM

## 2018-03-22 DIAGNOSIS — I63511 Cerebral infarction due to unspecified occlusion or stenosis of right middle cerebral artery: Secondary | ICD-10-CM

## 2018-03-22 DIAGNOSIS — Z515 Encounter for palliative care: Secondary | ICD-10-CM

## 2018-03-22 NOTE — Progress Notes (Signed)
PROGRESS NOTE    Kelly Benjamin  GMW:[id number] DOB: May 25, 1940 DOA: 04/03/2018 PCP: Maurice Small, MD     Brief Narrative:  Kelly Benjamin is a 78 year old woman with history of CAD, ischemic cardiomyopathy, CHF (EF 40-45% in 2018 and 15-20% in 2020), COPD, and HTN who was admitted on1/4/2020with dyspnea on exertion and BLE edema and found to have left upper lobe pneumonia, acute anemia (hgb 5.6), NSTEMI, and new onset atrial fibrillation. Initially required BiPAP. She was given Rocephin and azithromycin but this was transitioned to vanc/cefepime. She was diuresed during the hospital course. During her admit she developed new onset left sided weakness and gaze deviation. CTA noted total occlusion of right proximal M1 segment without collaterals. Underwent right common carotid arteriogram and found to have occluded right ICA terminus, right MCA and right ACA. She underwent thrombectomy with IR 03/12/2018.   1/4 admit, working diagnosis of CAP, complicated by new onset atrial fibrillation, non-ST elevation MI and new heart failure 1/10 thrombectomy, transfer to ICU 1/11 Remains hemi-paretic on the left side. MRIof the brain showed large MCA territory infarct involving basal ganglia and cortex, petechial hemorrhage throughout the infarct without parenchymal hemorrhage. Mass-effect with partial effacement of the right lateral ventricle and 6 mm right-to-left midline shift. 1/12 Patient currently extubated 1/16 Transitioned to comfort focused care   New events last 24 hours / Subjective: No new events, patient resting comfortably  Assessment & Plan:   Principal Problem:   Acute right MCA stroke ([place]) Active Problems:   COPD (chronic obstructive pulmonary disease) (HCC)   SOB (shortness of breath)   Ischemic cardiomyopathy   Normocytic anemia   Aspiration pneumonia (HCC)   NSTEMI (non-ST elevated myocardial infarction) (HCC)   Abnormal CXR   Atrial fibrillation (HCC)   Acute combined  systolic and diastolic heart failure (HCC)   Acute respiratory distress   Middle cerebral artery embolism, right   Acute hypoxemic respiratory failure (HCC)   Symptomatic anemia   Essential hypertension   Leukocytosis   Palliative care encounter   Comfort measures only status   AKI (acute kidney injury) ([place])    Patient transitioned to full comfort care.  Healthcare power of attorney at bedside.  Anticipate in-hospital death.   Antimicrobials:  Anti-infectives (From admission, onward)   Start     Dose/Rate Route Frequency Ordered Stop   03/15/18 1430  ceFEPIme (MAXIPIME) 2 g in sodium chloride 0.9 % 100 mL IVPB  Status:  Discontinued     2 g 200 mL/hr over 30 Minutes Intravenous Every 12 hours 03/15/18 1424 03/16/18 1007   03/13/2018 2121  ceFAZolin (ANCEF) 2-4 GM/100ML-% IVPB    Note to Pharmacy:  Margaretmary Dys   : cabinet override      03/12/2018 2121 03/15/18 0929   03/11/18 2000  vancomycin (VANCOCIN) IVPB 1000 mg/200 mL premix  Status:  Discontinued     1,000 mg 200 mL/hr over 60 Minutes Intravenous Every 24 hours 03/22/2018 1957 03/29/2018 1348   03/11/18 2000  ceFEPIme (MAXIPIME) 2 g in sodium chloride 0.9 % 100 mL IVPB  Status:  Discontinued     2 g 200 mL/hr over 30 Minutes Intravenous Every 24 hours 03/22/2018 1957 03/15/18 1424   03/11/18 1600  azithromycin (ZITHROMAX) 500 mg in sodium chloride 0.9 % 250 mL IVPB     500 mg 250 mL/hr over 60 Minutes Intravenous Every 24 hours 03/11/18 0219 03/12/18 1719   03/05/2018 1930  vancomycin (VANCOCIN) 1,500 mg in sodium chloride 0.9 %  500 mL IVPB     1,500 mg 250 mL/hr over 120 Minutes Intravenous  Once 03/07/2018 1920 03/17/2018 2209   03/24/2018 1930  ceFEPIme (MAXIPIME) 2 g in sodium chloride 0.9 % 100 mL IVPB     2 g 200 mL/hr over 30 Minutes Intravenous  Once 03/13/2018 1920 03/30/2018 2038   03/20/2018 1600  cefTRIAXone (ROCEPHIN) 1 g in sodium chloride 0.9 % 100 mL IVPB  Status:  Discontinued     1 g 200 mL/hr over 30 Minutes Intravenous  Every 24 hours 03/09/2018 1430 03/07/2018 1849   04/02/2018 1600  azithromycin (ZITHROMAX) 500 mg in sodium chloride 0.9 % 250 mL IVPB  Status:  Discontinued     500 mg 250 mL/hr over 60 Minutes Intravenous Every 24 hours 03/18/2018 1430 03/25/2018 1849        Objective: Vitals:   03/21/18 2031 03/21/18 2041 03/22/18 0500 03/22/18 0621  BP:    (!) 98/58  Pulse:    90  Resp:      Temp:    98.1 F (36.7 C)  TempSrc:    Oral  SpO2: 94% 93%  91%  Weight:   75.8 kg   Height:        Intake/Output Summary (Last 24 hours) at 03/22/2018 1054 Last data filed at 03/22/2018 0900 Gross per 24 hour  Intake 181.49 ml  Output 800 ml  Net -618.51 ml   Filed Weights   03/19/18 0500 03/20/18 0600 03/22/18 0500  Weight: 79.8 kg 79.6 kg 75.8 kg    Examination:  General exam: Appears calm and comfortable, minimally responsive to voice  Respiratory system: Clear to auscultation. Respiratory effort normal. Cardiovascular system: Irregular rhythm Gastrointestinal system: Abdomen is nondistended, soft    Data Reviewed: I have personally reviewed following labs and imaging studies  CBC: Recent Labs  Lab 03/16/18 0529 03/17/18 0749 03/18/18 0529 03/19/18 0349 03/20/18 0544  WBC 17.7* 22.0* 19.3* 16.1* 17.7*  HGB 9.2* 10.8* 10.3* 9.4* 9.0*  HCT 30.3* 35.4* 33.4* 32.1* 31.0*  MCV 84.4 86.3 85.6 86.5 88.1  PLT 266 268 312 301 035   Basic Metabolic Panel: Recent Labs  Lab 03/17/18 0749 03/18/18 0529 03/18/18 1543 03/19/18 0349 03/20/18 0544  NA 138 140 143 144 146*  K 4.4 4.3 4.1 4.5 4.3  CL 109 113* 117* 112* 115*  CO2 16* 16* 16* 18* 22  GLUCOSE 124* 154* 170* 150* 143*  BUN 27* 35* 40* 43* 44*  CREATININE 0.95 1.05* 1.15* 1.18* 1.05*  CALCIUM 9.1 9.6 9.6 9.5 9.3  MG 2.4 2.6* 2.6* 2.4 2.1  PHOS  --  3.3  --  2.9 3.2   GFR: Estimated Creatinine Clearance: 49.3 mL/min (A) (by C-G formula based on SCr of 1.05 mg/dL (H)). Liver Function Tests: No results for input(s): AST, ALT,  ALKPHOS, BILITOT, PROT, ALBUMIN in the last 168 hours. No results for input(s): LIPASE, AMYLASE in the last 168 hours. Recent Labs  Lab 03/20/18 0921  AMMONIA 22   Coagulation Profile: No results for input(s): INR, PROTIME in the last 168 hours. Cardiac Enzymes: No results for input(s): CKTOTAL, CKMB, CKMBINDEX, TROPONINI in the last 168 hours. BNP (last 3 results) No results for input(s): PROBNP in the last 8760 hours. HbA1C: No results for input(s): HGBA1C in the last 72 hours. CBG: Recent Labs  Lab 03/19/18 1930 03/19/18 2310 03/20/18 0307 03/20/18 0739 03/20/18 1151  GLUCAP 140* 161* 132* 133* 140*   Lipid Profile: No results for input(s): CHOL, HDL, LDLCALC,  TRIG, CHOLHDL, LDLDIRECT in the last 72 hours. Thyroid Function Tests: No results for input(s): TSH, T4TOTAL, FREET4, T3FREE, THYROIDAB in the last 72 hours. Anemia Panel: No results for input(s): VITAMINB12, FOLATE, FERRITIN, TIBC, IRON, RETICCTPCT in the last 72 hours. Sepsis Labs: No results for input(s): PROCALCITON, LATICACIDVEN in the last 168 hours.  Recent Results (from the past 240 hour(s))  MRSA PCR Screening     Status: None   Collection Time: 03/06/2018 11:45 PM  Result Value Ref Range Status   MRSA by PCR NEGATIVE NEGATIVE Final    Comment:        The GeneXpert MRSA Assay (FDA approved for NASAL specimens only), is one component of a comprehensive MRSA colonization surveillance program. It is not intended to diagnose MRSA infection nor to guide or monitor treatment for MRSA infections. Performed at [facility], [place] [address], [place], [place] [address]   Culture, respiratory (non-expectorated)     Status: None   Collection Time: 03/15/18 11:48 AM  Result Value Ref Range Status   Specimen Description TRACHEAL ASPIRATE  Final   Special Requests NONE  Final   Gram Stain NO WBC SEEN NO ORGANISMS SEEN   Final   Culture   Final    FEW Consistent with normal respiratory  flora. Performed at [facility], [place] [address] [place], [place] [address]    Report Status 03/17/2018 FINAL  Final       Radiology Studies: No results found.    Scheduled Meds: . arformoterol  15 mcg Nebulization BID  . budesonide (PULMICORT) nebulizer solution  0.25 mg Nebulization BID  . chlorhexidine gluconate (MEDLINE KIT)  15 mL Mouth Rinse BID  . ipratropium-albuterol  3 mL Nebulization BID  . mouth rinse  15 mL Mouth Rinse Q4H  . pantoprazole (PROTONIX) IV  40 mg Intravenous Q24H  . sodium chloride flush  3 mL Intravenous Q12H  . sodium chloride HYPERTONIC  4 mL Nebulization BID   Continuous Infusions: . sodium chloride    . morphine 1 mg/hr (03/22/18 0415)     LOS: 14 days    Time spent: 20 minutes   Dessa Phi, DO [facility] [email or web address] 03/22/2018, 10:54 AM

## 2018-03-22 NOTE — Progress Notes (Signed)
   Patient seen, chart reviewed.  1 of patient's good friends is at the bedside.  They have been staying with her around-the-clock.  Patient started on morphine continuous infusion on 03/21/2018 at 1 mg an hour.  Her respirations appear more labored this morning.  She also appears restless.  Friend states that overnight that she has moaned out and grabbed her chest.  She also is asking if patient's cat can come visit.  I did share that that records need to be present and bring cat in a carrier to the nurses station and check in with the charge nurse.  I also notified bedside RN today of that potential plan.  I discussed again possibility of moving to residential hospice but patient appears critically ill and I am worried that she could die in transport.  Additionally, family is adamantly opposed to moving from the hospital because of this additional stress.  Plan is to increase scheduled morphine to 2 mg an hour to ensure comfort and continue bolus at 2 mg every 30 minutes as needed.  She no longer can participate in using hand-held inhalers and we will DC those  Palliative medicine to stay involved as  patient transitions towards end-of-life.  Discussed prognosis of hours to a day days with family but stressed I would not be surprised if she passed this weekend.  Friend at the bedside is sad but are not surprised by this news and again reiterate patient's preparedness for end-of-life, and acceptance.  Thank you, Romona Curls, NP Palliative medicine team Time spent: 20 minutes Than 50% of time spent in counseling and coordination of care

## 2018-03-22 NOTE — Progress Notes (Signed)
  Patient seen, chart reviewed.  Patient's friends are at the bedside.  Patient is unresponsive to voice and light touch.  Chart review shows that patient is averaging morphine approximately every 3-4 hours for respiratory distress, pain.  Discussed role of IV continuous infusion in better managing symptoms.  Friends were in agreement.  We also talked about end-of-life and what to expect.  Her longtime friends are sad but states they know that she is excepting of where she is in life and is prepared.  Start morphine continuous infusion at 1 mg an hour and 2 mg every 30 minutes as needed.  Patient is comfort care.  We will continue her on nebulizers in case they are helping with respiratory status for now but see a time rapidly approaching where she will not be able to participate in administration.  No upper airway secretions noted.  Discussed residential hospice as an option to care for people as they near end of life.  At this point, I fear that patient is not safe for transport but we will continue to monitor.  Palliative medicine to stay involved in support patient as she transitions towards end-of-life, symptom management  Thank you, Romona Curls, NP Palliative medicine team Time spent: 25 minutes Greater than 50% of time spent in counseling and coordination of care

## 2018-04-05 NOTE — Progress Notes (Signed)
Wasted 27.5 mL of morphine with Wayna Chalet, RN as witness

## 2018-04-05 NOTE — Plan of Care (Signed)
  Problem: Clinical Measurements: Goal: Will remain free from infection Outcome: Progressing   Problem: Coping: Goal: Level of anxiety will decrease Outcome: Progressing   Problem: Pain Managment: Goal: General experience of comfort will improve Outcome: Progressing   

## 2018-04-05 NOTE — Death Summary Note (Signed)
DEATH SUMMARY   Patient Details  Name: Kelly Benjamin MRN: [id number] DOB: 02-01-1941  Admission/Discharge Information   Admit Date:  03/31/18  Date of Death: Date of Death: Apr 15, 2018  Time of Death: Time of Death: 0258  Length of Stay: Jun 10, 2022  Referring Physician: Maurice Small, MD   Reason(s) for Hospitalization  Acute respiratory failure, pneumonia, NSTEMI, A Fib, MCA stroke   Diagnoses   Principal Problem:   Acute right MCA stroke [facility]) Active Problems:   COPD (chronic obstructive pulmonary disease) ([place])   Shortness of breath   Ischemic cardiomyopathy   Normocytic anemia   Aspiration pneumonia ([place])   NSTEMI (non-ST elevated myocardial infarction) ([place])   Abnormal CXR   Atrial fibrillation ([place])   Acute combined systolic and diastolic heart failure ([place])   Acute respiratory distress   Middle cerebral artery embolism, right   Acute hypoxemic respiratory failure ([place])   Symptomatic anemia   Essential hypertension   Leukocytosis   Goals of care, counseling/discussion   Comfort measures only status   AKI (acute kidney injury) ([place])   Palliative care by specialist   Brief Hospital Course (including significant findings, care, treatment, and services provided and events leading to death)  Kelly Benjamin is a 78 year old woman with history of CAD, ischemic cardiomyopathy, CHF (EF 40-45% in 06/20/16 and 15-20% in 06-21-18), COPD, and HTN who was admitted onJanuary 27, 2020with dyspnea on exertion and BLE edema and found to have left upper lobe pneumonia, acute anemia (hgb 5.6), NSTEMI, and new onset atrial fibrillation. Initially required BiPAP. She was given Rocephin and azithromycin but this was transitioned to vanc/cefepime. She was diuresed during the hospital course.During her admit she developed new onset left sided weakness and gaze deviation. CTA noted total occlusion of right proximal M1 segment without collaterals. Underwent right common carotid arteriogram and found to have  occluded right ICA terminus, right MCA and right ACA. She underwent thrombectomy with IR 03/22/2018.   1/4 admit, working diagnosis of CAP, complicated by new onset atrial fibrillation, non-ST elevation MI and new heart failure 1/10 thrombectomy, transfer to ICU 1/11 Remains hemi-paretic on the left side. MRIof the brain showed large MCA territory infarct involving basal ganglia and cortex, petechial hemorrhage throughout the infarct without parenchymal hemorrhage. Mass-effect with partial effacement of the right lateral ventricle and 6 mm right-to-left midline shift. 1/12 Patient currently extubated 1/16 Transitioned to comfort focused care  Pertinent Labs and Studies  Significant Diagnostic Studies Ct Angio Head W Or Wo Contrast  Result Date: 03/20/2018 CLINICAL DATA:  Acute onset of left-sided weakness 2 hours ago. EXAM: CT ANGIOGRAPHY HEAD AND NECK TECHNIQUE: Multidetector CT imaging of the head and neck was performed using the standard protocol during bolus administration of intravenous contrast. Multiplanar CT image reconstructions and MIPs were obtained to evaluate the vascular anatomy. Carotid stenosis measurements (when applicable) are obtained utilizing NASCET criteria, using the distal internal carotid diameter as the denominator. CONTRAST:  181mL ISOVUE-370 IOPAMIDOL (ISOVUE-370) INJECTION 76% COMPARISON:  CT head without contrast 03/08/2018 FINDINGS: CTA NECK FINDINGS Aortic arch: Atherosclerotic calcifications are present at the aortic arch. There is no aneurysm. No significant stenosis is present at the great vessel origins. Right carotid system: The right common carotid artery is tortuous with atherosclerotic changes throughout the course of the right common carotid artery. Calcifications extend through the bifurcation to the proximal right ICA without a significant stenosis relative to the more distal vessel. The cervical right ICA is within normal limits. Left carotid system:  Minimal atherosclerotic  calcifications are present within the left common carotid artery. Dense calcifications are present at the left carotid bifurcation. There is no significant stenosis relative to the more distal vessel. Atherosclerotic changes extend over 3 cm from the bifurcation. The cervical left ICA is otherwise normal. Vertebral arteries: The proximal left vertebral artery demonstrates a high-grade stenosis with extensive calcification. Opacification of the more distal left vertebral artery may reflect retrograde flow. No definite lumen is seen proximally. The non dominant right vertebral artery origin is within normal limits. There is no significant stenosis to the right vertebral artery in the neck. Atherosclerotic changes are present at C1-2 on the left without a significant stenosis. Skeleton: Multilevel degenerative changes are present in the cervical spine, particularly facet disease. This results in slight anterolisthesis at C4-5. No focal lytic or blastic lesions are present. Other neck: The soft tissues the neck demonstrate no focal mucosal or submucosal lesions. Thyroid is within normal limits. No significant adenopathy is present. Salivary glands are normal. Upper chest: Increasing bilateral pleural effusions are present. Right upper lobe consolidated pneumonia is again seen. Mild edema is present. Review of the MIP images confirms the above findings CTA HEAD FINDINGS Anterior circulation: Atherosclerotic changes are present throughout the cavernous right internal carotid artery without a significant stenosis through the ICA terminus. Calcifications are present in the left cavernous internal carotid artery without a significant stenosis. A proximal right M1 segment occlusion is present. Right A1 segment is patent. Left A1 and M1 segments are within normal limits. The left MCA bifurcation is intact. Left MCA branch vessels are within normal limits. There is mild distal small vessel disease of the  ACA vessels. No significant right MCA collateralization is present. Posterior circulation: The left vertebral artery is the dominant vessel. The hypoplastic right vertebral artery essentially terminates at the PICA sending only very small branch to the vertebrobasilar junction. There is mild narrowing of the distal left V4 segment. Basilar artery is small. Both posterior cerebral arteries originate from the basilar tip. A small left posterior communicating artery is present. PCA branch vessels demonstrate mild irregularity without a significant proximal stenosis or occlusion. Venous sinuses: The dural sinuses are patent. Straight sinus and deep cerebral veins are intact. Cortical veins are unremarkable. Anatomic variants: None Review of the MIP images confirms the above findings IMPRESSION: 1. Emergent large vessel occlusion involving the proximal right M1 segment. 2. Very poor collateral flow to the right MCA distribution. 3. 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 4. Opacification of the left vertebral artery above the V1 segment may be retrograde flow. 5. Mild diffuse small vessel disease within the ACA branch vessels, left MCA branch vessels, and posterior circulation. 6. Extensive atherosclerotic calcification and vessel wall irregularity at the carotid bifurcations bilaterally without significant stenosis. 7. Extensive atherosclerotic changes within the cavernous internal carotid arteries bilaterally without significant stenosis. 8. Atherosclerotic changes at the aortic arch and particularly the left subclavian artery without significant stenosis. The above was relayed via text pager to Dr. Roland Rack on 03/06/2018 at 20:55. Electronically Signed   By: San Morelle M.D.   On: 03/05/2018 21:17   Dg Chest 2 View  Result Date: 03/22/2018 CLINICAL DATA:  Chest pressure and shortness of breath on exertion. EXAM: CHEST - 2 VIEW COMPARISON:  03/03/2016  FINDINGS: The heart size appears within normal limits. Aortic atherosclerosis. Masslike consolidation within the left upper lobe is identified. This is new when compared with the previous exam. Mild diffuse edema and small  left pleural effusion. IMPRESSION: 1. Masslike consolidation within the left upper lobe is identified. In the acute setting is favored to represent pneumonia. In the absence of signs or symptoms of infection consider CT of the chest with contrast material to assess for underlying malignancy in this patient who has a history of smoking and may be at increased risk for lung cancer. 2. Small left pleural effusion and edema. Electronically Signed   By: Kerby Moors M.D.   On: 03/07/2018 10:52   Ct Angio Neck W Or Wo Contrast  Result Date: 03/26/2018 CLINICAL DATA:  Acute onset of left-sided weakness 2 hours ago. EXAM: CT ANGIOGRAPHY HEAD AND NECK TECHNIQUE: Multidetector CT imaging of the head and neck was performed using the standard protocol during bolus administration of intravenous contrast. Multiplanar CT image reconstructions and MIPs were obtained to evaluate the vascular anatomy. Carotid stenosis measurements (when applicable) are obtained utilizing NASCET criteria, using the distal internal carotid diameter as the denominator. CONTRAST:  169mL ISOVUE-370 IOPAMIDOL (ISOVUE-370) INJECTION 76% COMPARISON:  CT head without contrast 03/25/2018 FINDINGS: CTA NECK FINDINGS Aortic arch: Atherosclerotic calcifications are present at the aortic arch. There is no aneurysm. No significant stenosis is present at the great vessel origins. Right carotid system: The right common carotid artery is tortuous with atherosclerotic changes throughout the course of the right common carotid artery. Calcifications extend through the bifurcation to the proximal right ICA without a significant stenosis relative to the more distal vessel. The cervical right ICA is within normal limits. Left carotid system: Minimal  atherosclerotic calcifications are present within the left common carotid artery. Dense calcifications are present at the left carotid bifurcation. There is no significant stenosis relative to the more distal vessel. Atherosclerotic changes extend over 3 cm from the bifurcation. The cervical left ICA is otherwise normal. Vertebral arteries: The proximal left vertebral artery demonstrates a high-grade stenosis with extensive calcification. Opacification of the more distal left vertebral artery may reflect retrograde flow. No definite lumen is seen proximally. The non dominant right vertebral artery origin is within normal limits. There is no significant stenosis to the right vertebral artery in the neck. Atherosclerotic changes are present at C1-2 on the left without a significant stenosis. Skeleton: Multilevel degenerative changes are present in the cervical spine, particularly facet disease. This results in slight anterolisthesis at C4-5. No focal lytic or blastic lesions are present. Other neck: The soft tissues the neck demonstrate no focal mucosal or submucosal lesions. Thyroid is within normal limits. No significant adenopathy is present. Salivary glands are normal. Upper chest: Increasing bilateral pleural effusions are present. Right upper lobe consolidated pneumonia is again seen. Mild edema is present. Review of the MIP images confirms the above findings CTA HEAD FINDINGS Anterior circulation: Atherosclerotic changes are present throughout the cavernous right internal carotid artery without a significant stenosis through the ICA terminus. Calcifications are present in the left cavernous internal carotid artery without a significant stenosis. A proximal right M1 segment occlusion is present. Right A1 segment is patent. Left A1 and M1 segments are within normal limits. The left MCA bifurcation is intact. Left MCA branch vessels are within normal limits. There is mild distal small vessel disease of the ACA  vessels. No significant right MCA collateralization is present. Posterior circulation: The left vertebral artery is the dominant vessel. The hypoplastic right vertebral artery essentially terminates at the PICA sending only very small branch to the vertebrobasilar junction. There is mild narrowing of the distal left V4 segment. Basilar artery is  small. Both posterior cerebral arteries originate from the basilar tip. A small left posterior communicating artery is present. PCA branch vessels demonstrate mild irregularity without a significant proximal stenosis or occlusion. Venous sinuses: The dural sinuses are patent. Straight sinus and deep cerebral veins are intact. Cortical veins are unremarkable. Anatomic variants: None Review of the MIP images confirms the above findings IMPRESSION: 1. Emergent large vessel occlusion involving the proximal right M1 segment. 2. Very poor collateral flow to the right MCA distribution. 3. 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 4. Opacification of the left vertebral artery above the V1 segment may be retrograde flow. 5. Mild diffuse small vessel disease within the ACA branch vessels, left MCA branch vessels, and posterior circulation. 6. Extensive atherosclerotic calcification and vessel wall irregularity at the carotid bifurcations bilaterally without significant stenosis. 7. Extensive atherosclerotic changes within the cavernous internal carotid arteries bilaterally without significant stenosis. 8. Atherosclerotic changes at the aortic arch and particularly the left subclavian artery without significant stenosis. The above was relayed via text pager to Dr. Roland Rack on 03/13/2018 at 20:55. Electronically Signed   By: San Morelle M.D.   On: 03/13/2018 21:17   Ct Chest Wo Contrast  Result Date: 03/07/2018 CLINICAL DATA:  Shortness of breath for 2 days. EXAM: CT CHEST WITHOUT CONTRAST TECHNIQUE: Multidetector CT imaging of  the chest was performed following the standard protocol without IV contrast. COMPARISON:  CT chest 02/28/2016. PA and lateral chest 02/28/2016 and earlier today. FINDINGS: Cardiovascular: There is calcific aortic and coronary atherosclerosis. Heart size is normal. No pericardial effusion. Mediastinum/Nodes: No enlarged mediastinal or axillary lymph nodes. Thyroid gland, trachea, and esophagus demonstrate no significant findings. Small hiatal hernia noted. Lungs/Pleura: The patient has a small left pleural effusion. There is dense airspace disease in the anterior left upper lobe. Mild interlobular septal thickening is identified bilaterally. There is some dependent atelectatic change. Mild apical scar is noted. Ground-glass attenuating nodule in the right upper lobe seen on the prior chest CT is not visualized on this exam. Upper Abdomen: No acute abnormality. Musculoskeletal: No lytic or sclerotic lesion. Degenerative disc disease T12-L1 noted. IMPRESSION: Dense left upper lobe airspace disease is presumably pneumonia. Recommend plain film follow-up after appropriate therapy to exclude underlying neoplasm. Small left pleural effusion noted. Mild interlobular septal thickening suggestive of interstitial edema. Calcific coronary artery disease. Aortic Atherosclerosis (ICD10-I70.0) and Emphysema (ICD10-J43.9). Electronically Signed   By: Inge Rise M.D.   On: 03/22/2018 15:18   Mr Jodene Nam Head Wo Contrast  Result Date: 03/15/2018 CLINICAL DATA:  Status post revascularization of right M1 occlusion. Right MCA infarct. EXAM: MRI HEAD WITHOUT CONTRAST MRA HEAD WITHOUT CONTRAST TECHNIQUE: Multiplanar, multiecho pulse sequences of the brain and surrounding structures were obtained without intravenous contrast. Angiographic images of the head were obtained using MRA technique without contrast. COMPARISON:  CTA head and neck 03/14/2017. FINDINGS: MRI HEAD FINDINGS Brain: Diffusion-weighted images demonstrate a large  right MCA territory infarct involving the right frontal and parietal lobe. There is involving the superior temporal lobe and insular cortex. Right lentiform nucleus and caudate are involved. T2 and FLAIR sequences demonstrate diffuse edema. There is petechial hemorrhage throughout the infarct territory. Petechial hemorrhage is noted in the right basal ganglia as well. No parenchymal hemorrhage is present. There is mass effect with edema in the cortex. There is partial effacement of the right lateral ventricle. Midline shift measures 6 mm at the foramen of [place]ro. No new infarct is present. Moderate diffuse white  matter disease is present in the left hemisphere. Brainstem is within normal limits. Remote lacunar infarcts are again noted in the cerebellum bilaterally. The internal auditory canals are within normal limits. Vascular: Flow is present in the major intracranial arteries. Skull and upper cervical spine: Fluid in the nasopharynx is likely related to intubation. Exaggerated cervical lordosis is again noted. Sinuses/Orbits: Mild mucosal thickening is present throughout the ethmoid air cells. There is minimal mucosal thickening in the right maxillary sinus. Small mastoid effusions are present. Bilateral lens replacements are noted. Globes and orbits are otherwise unremarkable. MRA HEAD FINDINGS The internal carotid arteries are patent bilaterally. Mild atherosclerotic changes are noted in the cavernous segments. Flow is maintained within the right ICA, A1, and M1 segments. Left A1 and M1 segments are normal. ACA and MCA branch vessels are normal bilaterally. The vertebrobasilar junction is below the field of view. Basilar artery is normal. AICA vessels are normal. Both posterior cerebral arteries originate from basilar tip. A left posterior communicating artery is present. PCA branch vessels are normal. IMPRESSION: 1. 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 2. Petechial hemorrhage  throughout the infarct without parenchymal hemorrhage. 3. Mass effect with partial effacement of the right lateral ventricle and 6 mm of right-to-left midline shift. 4. Left cerebral hemisphere white matter disease consistent with underlying microvascular ischemia. 5. MRA demonstrates persistent patent flow within the right ICA and MCA. Electronically Signed   By: San Morelle M.D.   On: 03/15/2018 16:12   Mr Brain Wo Contrast  Result Date: 03/15/2018 CLINICAL DATA:  Status post revascularization of right M1 occlusion. Right MCA infarct. EXAM: MRI HEAD WITHOUT CONTRAST MRA HEAD WITHOUT CONTRAST TECHNIQUE: Multiplanar, multiecho pulse sequences of the brain and surrounding structures were obtained without intravenous contrast. Angiographic images of the head were obtained using MRA technique without contrast. COMPARISON:  CTA head and neck 03/14/2017. FINDINGS: MRI HEAD FINDINGS Brain: Diffusion-weighted images demonstrate a large right MCA territory infarct involving the right frontal and parietal lobe. There is involving the superior temporal lobe and insular cortex. Right lentiform nucleus and caudate are involved. T2 and FLAIR sequences demonstrate diffuse edema. There is petechial hemorrhage throughout the infarct territory. Petechial hemorrhage is noted in the right basal ganglia as well. No parenchymal hemorrhage is present. There is mass effect with edema in the cortex. There is partial effacement of the right lateral ventricle. Midline shift measures 6 mm at the foramen of [place]. No new infarct is present. Moderate diffuse white matter disease is present in the left hemisphere. Brainstem is within normal limits. Remote lacunar infarcts are again noted in the cerebellum bilaterally. The internal auditory canals are within normal limits. Vascular: Flow is present in the major intracranial arteries. Skull and upper cervical spine: Fluid in the nasopharynx is likely related to intubation. Exaggerated  cervical lordosis is again noted. Sinuses/Orbits: Mild mucosal thickening is present throughout the ethmoid air cells. There is minimal mucosal thickening in the right maxillary sinus. Small mastoid effusions are present. Bilateral lens replacements are noted. Globes and orbits are otherwise unremarkable. MRA HEAD FINDINGS The internal carotid arteries are patent bilaterally. Mild atherosclerotic changes are noted in the cavernous segments. Flow is maintained within the right ICA, A1, and M1 segments. Left A1 and M1 segments are normal. ACA and MCA branch vessels are normal bilaterally. The vertebrobasilar junction is below the field of view. Basilar artery is normal. AICA vessels are normal. Both posterior cerebral arteries originate from basilar tip. A left posterior  communicating artery is present. PCA branch vessels are normal. IMPRESSION: 1. 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 2. Petechial hemorrhage throughout the infarct without parenchymal hemorrhage. 3. Mass effect with partial effacement of the right lateral ventricle and 6 mm of right-to-left midline shift. 4. Left cerebral hemisphere white matter disease consistent with underlying microvascular ischemia. 5. MRA demonstrates persistent patent flow within the right ICA and MCA. Electronically Signed   By: San Morelle M.D.   On: 03/15/2018 16:12   [place]  Result Date: 03/18/2018 INDICATION: Right gaze deviation with left-sided hemiplegia. Occluded right internal carotid artery at the terminus, and right middle cerebral artery and right anterior cerebral artery occlusion. EXAM: 1. EMERGENT LARGE VESSEL OCCLUSION THROMBOLYSIS (anterior CIRCULATION) COMPARISON:  CT angiogram of the head and neck of 03/26/2018. MEDICATIONS: Ancef 2 g IV antibiotic was administered within 1 hour of the procedure. ANESTHESIA/SEDATION: General anesthesia. CONTRAST:  Isovue 300 approximately 50 mL. FLUOROSCOPY TIME:  Fluoroscopy  Time: 25 minutes 0 seconds (1672 mGy). COMPLICATIONS: None immediate. TECHNIQUE: Following a full explanation of the procedure along with the potential associated complications, an informed witnessed consent was obtained from the patient's family. The risks of intracranial hemorrhage of 10%, worsening neurological deficit, ventilator dependency, death and inability to revascularize were all reviewed in detail with the patient's family. The patient was then put under general anesthesia by the Department of Anesthesiology at [facility]. The right groin was prepped and draped in the usual sterile fashion. Thereafter using modified Seldinger technique, transfemoral access into the right common femoral artery was obtained without difficulty. Over a 0.035 inch guidewire a 5 French Pinnacle sheath was inserted. Through this, and also over a 0.035 inch guidewire a 5 [place] JB 1 catheter was advanced to the aortic arch region and selectively positioned in the common carotid artery and the innominate artery. FINDINGS: The innominate artery injection demonstrates wide patency at the origin of the right common carotid artery and right subclavian artery. Hypoplastic right vertebral artery is seen opacifying to the level of approximately C2. The right common carotid arteriogram demonstrates moderate stenosis at the origin of the right external carotid artery. A mild stenosis is also seen of the right common carotid artery at the level of the bifurcation. The right internal carotid artery demonstrates a mild stenosis at its origin. More distally the right internal carotid artery is seen to opacify to the cranial skull base. There are is slow ascent of contrast to the cranial skull base. The petrous and the cavernous segments are widely patent. A right posterior communicating artery is seen opacifying the right posterior cerebral distribution. There is a tapered near complete occlusion of the right internal carotid artery  at the terminus due to a large clot, with occlusion of the right middle cerebral artery. The right anterior cerebral artery proximally appears patent in the A1 region. PROCEDURE: ENDOVASCULAR REVASCULARIZATION OF OCCLUDED RIGHT INTERNAL CAROTID ARTERY RIGHT MIDDLE CEREBRAL ARTERY AND RIGHT ANTERIOR CEREBRAL ARTERY WITH MECHANICAL THROMBECTOMY. The diagnostic JB 1 catheter in right common carotid artery was exchanged over a 0.035 inch 300 cm Rosen exchange guidewire for an 8 French 55 cm Brite tip neurovascular sheath. The guidewire was removed. Good aspiration obtained from the side port of the neurovascular sheath. This was then connected to continuous heparinized saline infusion. Over the [employer] guidewire, an 8 [place] 85 cm FlowGate balloon guide catheter which had been prepped with 50% contrast and 50% heparinized saline infusion was advanced  and positioned in the proximal 1/3 of the right internal carotid artery. The guidewire was removed. Good aspiration obtained from the hub of the [facility] guide catheter. 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 At this time, a combination of the 132 cm Catalyst 5 [place] guide catheter inside of which was an [employer] microcatheter was advanced over a 0.014 inch Softip Synchro micro guidewire to the distal end of the [facility] guide catheter. With the micro guidewire leading with a J-tip configuration, the combination was navigated without difficulty to the distal cavernous segment of the right internal carotid artery. The micro guidewire was then gently manipulated through the clot mass involving the supraclinoid right ICA in the right middle cerebral artery. The micro guidewire was advanced into the inferior division of the right middle cerebral artery M2 M3 region followed by the  microcatheter. The guidewire was removed. Good aspiration was obtained from the hub of the microcatheter. A gentle contrast injection demonstrated safe position of tip of the microcatheter. At this time, an Embotrap 5 mm x 33 mm retrieval device was then advanced in a coaxial manner and with constant heparinized saline infusion to the distal end of the microcatheter. The O ring on the delivery microcatheter was then loosened. With slight forward gentle traction with the right hand on the delivery micro guidewire with the left hand the delivery microcatheter was retrieved deploying the distal and then the proximal portion of the retrieval device. Proximal flow arrest was then established by inflating the occluding balloon at the tip of the [facility] guide catheter. Thereafter as constant aspiration was applied with a 60 mL syringe at the hub of the [facility] guide catheter, and with a Penumbra vacuum suction pump at the hub of the Catalyst guide catheter the combination of the retrieval device, the microcatheter and the 5 [place] Catalyst guide catheter was retrieved and removed. Aspiration at the hub of the [facility] guide catheter was maintained as proximal flow arrest was reversed. Aspirate contained a few specks of clot. There was a few specks noted also entwined in the struts of the retrieval device. A control arteriogram performed following free back bleed of blood at the hub of the [facility] guide catheter demonstrated patchy opacification of the supraclinoid right ICA and also the right anterior cerebral artery and the right middle cerebellar artery at their origins. 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 At this time, a second pass was made with the same combination. The micro guidewire was advanced into the M2 M3 region of the inferior division followed by the microcatheter. The Embotrap retrieval device was deployed as mentioned earlier. Proximal flow arrest was established by  inflating the balloon of the [facility] guide catheter in the right internal carotid artery. The Catalyst guide catheter was advanced such that it was imbedded in the clot mass in the right internal carotid artery terminus. Thereafter, with constant aspiration at the hub of the [facility] guide catheter and at the hub of the 5 [place] Catalyst guide catheter at its hub, the combination of the retrieval device, and the microcatheter and the Catalyst guide catheter were retrieved and removed. Again flow arrest was relieved with free and back bleed of blood at the hub of the [facility] guide catheter. At this time, chunks of clot were noted in the aspirate, and also in the [place]  of the [employer] guide catheter. A control arteriogram performed through the 8 [place] FlowGate guide catheter in the right internal carotid artery demonstrated complete angiographic revascularization of the right internal carotid artery in the entirety, the right posterior cerebral artery, the anterior choroidal artery, the right middle cerebral artery and the right anterior cerebral artery into the capillary and venous phases. There was moderate spasm in the right middle cerebral artery proximally which responded to 2 aliquots of 25 mcg of nitroglycerin intra-arterially. Revascularization had been established. Throughout the procedure, the patient's blood pressure and neurological status remained stable. No angiographic evidence of extravasation or of mass effect or midline shift was noted. The [facility] guide catheter, and the 8 French neurovascular sheath were then retrieved and removed, and exchanged for an 8 [place] Pinnacle sheath over a J-tip guidewire. This was removed with manual compression being held for hemostasis. The right groin appeared soft without evidence of hematoma or bleeding. Distal pulses remained Dopplerable in the dorsalis pedis, and posterior tibial regions at the end of the procedure unchanged. The Dyna CT of the brain  performed right after the procedure demonstrated no evidence of hemorrhage, or of mass effect or midline shift. The patient was then transported to the neuro ICU intubated for further post stroke management. IMPRESSION: Status post endovascular complete revascularization of occluded right internal carotid artery terminus, the right middle cerebral artery and right anterior cerebral artery with 2 passes with the 5 mm x 33 mm Embotrap retrieval device achieving a TICI 3 revascularization. PLAN: Follow-up in the clinic 4 weeks post discharge Electronically Signed   By: Luanne Bras M.D.   On: 03/17/2018 13:40   Dg Chest Port 1 View  Result Date: 03/20/2018 CLINICAL DATA:  Follow-up aspiration pneumonia EXAM: PORTABLE CHEST 1 VIEW COMPARISON:  03/19/2018 FINDINGS: Soft feeding tube enters the abdomen. Persistent left lower lobe consolidation and volume loss with left effusion. Better aeration within the left upper lobe. Mild persistent volume loss and infiltrate in the right lower lobe. Volume loss could be slightly worsened on that side. IMPRESSION: Radiographic improvement in the left upper lobe. Persistent consolidation and collapse in the left lower lobe with left effusion. Slightly worsened volume loss in the right lower lobe with mild patchy infiltrate. Electronically Signed   By: Nelson Chimes M.D.   On: 03/20/2018 07:43   Dg Chest Port 1 View  Result Date: 03/19/2018 CLINICAL DATA:  Pneumonia EXAM: PORTABLE CHEST 1 VIEW COMPARISON:  03/17/2018 FINDINGS: Consolidation throughout the left lung has worsened. Hazy opacity at the right base is stable. No pneumothorax. Hyperaeration. Feeding tube placed. The tip is beyond the gastroesophageal junction. Upper normal heart size. IMPRESSION: Worsening consolidation throughout the left lung. Stable hazy opacity at the right base. Electronically Signed   By: Marybelle Killings M.D.   On: 03/19/2018 08:08   Dg Chest Port 1 View  Result Date: 03/17/2018 CLINICAL  DATA:  Follow-up pneumonia EXAM: PORTABLE CHEST 1 VIEW COMPARISON:  03/15/2018 FINDINGS: Cardiac shadow remains enlarged. Aortic calcifications are again seen. The endotracheal tube and nasogastric catheter have been removed in the interval. Persistent masslike density in the left mid lung is seen. Small effusions are noted bilaterally left greater than right. No other focal abnormality is seen. IMPRESSION: Persistent masslike density in the left mid lung. Bilateral small effusions. Electronically Signed   By: Inez Catalina M.D.   On: 03/17/2018 07:11   Dg Chest Port 1 View  Result Date: 03/15/2018 CLINICAL DATA:  Follow-up CHF. EXAM: PORTABLE  CHEST 1 VIEW COMPARISON:  03/21/2018 and older exams. FINDINGS: Masslike area of consolidation in the left mid lung is stable. There are stable prominent bronchovascular and interstitial markings bilaterally. There is confluent opacity at the left lung base, unchanged. Small left and probable small right pleural effusions. No pneumothorax. Endotracheal tube tip projects 3.3 cm above the Carina. Nasal/orogastric tube passes below the diaphragm into the stomach. IMPRESSION: 1. Similar appearance to the previous day's study. 2. Masslike opacity in the left mid lung. Left lung base opacity most likely atelectasis although pneumonia is possible. Small left and probable small right pleural effusions. No overt pulmonary edema. There are chronically prominent bronchovascular and interstitial markings. Electronically Signed   By: Lajean Manes M.D.   On: 03/15/2018 19:17   Dg Chest Port 1 View  Result Date: 03/15/2018 CLINICAL DATA:  Intubation EXAM: PORTABLE CHEST 1 VIEW COMPARISON:  03/11/2018 FINDINGS: Endotracheal tube placed with tip measuring 3.3 cm above the carina. Enteric tube placed. Tip is off the field of view but below the left hemidiaphragm. Cardiac enlargement. No vascular congestion. Left perihilar mass or consolidation with consolidation or atelectasis in the  left lower lung as well. Similar appearance to previous study, lying for differences in technique. No pneumothorax. Calcification of the aorta. IMPRESSION: Appliances appear in satisfactory location. Cardiac enlargement. Left perihilar mass or consolidation with consolidation or atelectasis in the left lower lung, unchanged. Electronically Signed   By: Lucienne Capers M.D.   On: 03/15/2018 00:16   Dg Chest Port 1 View  Result Date: 03/11/2018 CLINICAL DATA:  Shortness of breath EXAM: PORTABLE CHEST 1 VIEW COMPARISON:  03/19/2018 FINDINGS: Cardiac shadow remains enlarged. Aortic calcifications are again seen. Rounded opacity is again noted in the left mid lung similar to that seen on prior plain film and CT examination. No significant improvement is noted. Improved aeration in the right lung is noted. No bony abnormality is seen. IMPRESSION: Improved aeration in the right lung base. Stable masslike infiltrate in the left upper lobe Electronically Signed   By: Inez Catalina M.D.   On: 03/11/2018 10:37   Dg Chest Port 1 View  Result Date: 04/03/2018 CLINICAL DATA:  Dyspnea. EXAM: PORTABLE CHEST 1 VIEW COMPARISON:  Radiograph of March 09, 2018. FINDINGS: Stable cardiomegaly. Atherosclerosis of thoracic aorta is noted. No pneumothorax is noted. Stable bilateral lung opacities are noted most consistent with pneumonia. Small bilateral pleural effusions are noted. Bony thorax is unremarkable. IMPRESSION: Stable bilateral lung opacities are noted consistent with pneumonia and bilateral pleural effusions. Aortic Atherosclerosis (ICD10-I70.0). Electronically Signed   By: Marijo Conception, M.D.   On: 03/05/2018 18:23   Dg Chest Port 1 View  Result Date: 03/09/2018 CLINICAL DATA:  78 y/o  F; shortness of breath since yesterday. EXAM: PORTABLE CHEST 1 VIEW COMPARISON:  03/08/2017 chest radiograph and chest CT. FINDINGS: Stable cardiac silhouette given projection and technique. Aortic atherosclerosis with calcification.  Left mid lung zone dense consolidation is stable. Increasing hazy opacification at the lung bases, probably layering of pleural effusion. No pneumothorax. No acute osseous abnormality is evident. IMPRESSION: 1. Stable left mid lung zone consolidation, likely pneumonia. Followup PA and lateral chest X-ray is recommended in 3-4 weeks following trial of antibiotic therapy to ensure resolution and exclude underlying malignancy. 2. Increasing hazy opacification of the lung bases, probably layering of pleural effusions. Electronically Signed   By: Kristine Garbe M.D.   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 EXAM: 1. EMERGENT LARGE VESSEL OCCLUSION THROMBOLYSIS (anterior CIRCULATION) COMPARISON:  CT angiogram of the head and neck of 03/24/2018. MEDICATIONS: Ancef 2 g IV antibiotic was administered within 1 hour of the procedure. ANESTHESIA/SEDATION: General anesthesia. CONTRAST:  Isovue 300 approximately 50 mL. FLUOROSCOPY TIME:  Fluoroscopy Time: 25 minutes 0 seconds (1672 mGy). COMPLICATIONS: None immediate. TECHNIQUE: Following a full explanation of the procedure along with the potential associated complications, an informed witnessed consent was obtained from the patient's family. The risks of intracranial hemorrhage of 10%, worsening neurological deficit, ventilator dependency, death and inability to revascularize were all reviewed in detail with the patient's family. The patient was then put under general anesthesia by the Department of Anesthesiology at [facility]. The right groin was prepped and draped in the usual sterile fashion. Thereafter using modified Seldinger technique, transfemoral access into the right common  femoral artery was obtained without difficulty. Over a 0.035 inch guidewire a 5 [place] Pinnacle sheath was inserted. Through this, and also over a 0.035 inch guidewire a 5 [place] JB 1 catheter was advanced to the aortic arch region and selectively positioned in the common carotid artery and the innominate artery. FINDINGS: The innominate artery injection demonstrates wide patency at the origin of the right common carotid artery and right subclavian artery. Hypoplastic right vertebral artery is seen opacifying to the level of approximately C2. The right common carotid arteriogram demonstrates moderate stenosis at the origin of the right external carotid artery. A mild stenosis is also seen of the right common carotid artery at the level of the bifurcation. The right internal carotid artery demonstrates a mild stenosis at its origin. More distally the right internal carotid artery is seen to opacify to the cranial skull base. There are is slow ascent of contrast to the cranial skull base. The petrous and the cavernous segments are widely patent. A right posterior communicating artery is seen opacifying the right posterior cerebral distribution. There is a tapered near complete occlusion of the right internal carotid artery at the terminus due to a large clot, with occlusion of the right middle cerebral artery. The right anterior cerebral artery proximally appears patent in the A1 region. PROCEDURE: ENDOVASCULAR REVASCULARIZATION OF OCCLUDED RIGHT INTERNAL CAROTID ARTERY RIGHT MIDDLE CEREBRAL ARTERY AND RIGHT ANTERIOR CEREBRAL ARTERY WITH MECHANICAL THROMBECTOMY. The diagnostic JB 1 catheter in right common carotid artery was exchanged over a 0.035 inch 300 cm Rosen exchange guidewire for an 8 [place] 55 cm Brite tip neurovascular sheath. The guidewire was removed. Good aspiration obtained from the side port of the neurovascular sheath. This was then connected to continuous heparinized saline infusion. Over the [employer] guidewire, an 8 [place] 85 cm FlowGate balloon guide catheter which had been prepped with 50% contrast and 50% heparinized saline infusion was advanced and positioned in the proximal 1/3 of the right internal carotid artery. The guidewire was removed. Good aspiration obtained from the hub of the [facility] guide catheter. 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 At this time, a combination of the 132 cm Catalyst 5 [place] guide catheter inside of which was an [place] microcatheter was advanced over a 0.014 inch Softip Synchro micro guidewire to the distal end  of the [facility] guide catheter. With the micro guidewire leading with a J-tip configuration, the combination was navigated without difficulty to the distal cavernous segment of the right internal carotid artery. The micro guidewire was then gently manipulated through the clot mass involving the supraclinoid right ICA in the right middle cerebral artery. The micro guidewire was advanced into the inferior division of the right middle cerebral artery M2 M3 region followed by the microcatheter. The guidewire was removed. Good aspiration was obtained from the hub of the microcatheter. A gentle contrast injection demonstrated safe position of tip of the microcatheter. At this time, an Embotrap 5 mm x 33 mm retrieval device was then advanced in a coaxial manner and with constant heparinized saline infusion to the distal end of the microcatheter. The O ring on the delivery microcatheter was then loosened. With slight forward gentle traction with the right hand on the delivery micro guidewire with the left hand the delivery microcatheter was retrieved deploying the distal and then the proximal portion of the retrieval device. Proximal flow arrest was then established by inflating the  occluding balloon at the tip of the [facility] guide catheter. Thereafter as constant aspiration was applied with a 60 mL syringe at the hub of the [facility] guide catheter, and with a Penumbra vacuum suction pump at the hub of the Catalyst guide catheter the combination of the retrieval device, the microcatheter and the 5 [place] Catalyst guide catheter was retrieved and removed. Aspiration at the hub of the [facility] guide catheter was maintained as proximal flow arrest was reversed. Aspirate contained a few specks of clot. There was a few specks noted also entwined in the struts of the retrieval device. A control arteriogram performed following free back bleed of blood at the hub of the [facility] guide catheter demonstrated patchy opacification of the supraclinoid right ICA and also the right anterior cerebral artery and the right middle cerebellar artery at their origins. 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 At this time, a second pass was made with the same combination. The micro guidewire was advanced into the M2 M3 region of the inferior division followed by the microcatheter. The Embotrap retrieval device was deployed as mentioned earlier. Proximal flow arrest was established by inflating the balloon of the [facility] guide catheter in the right internal carotid artery. The Catalyst guide catheter was advanced such that it was imbedded in the clot mass in the right internal carotid artery terminus. Thereafter, with constant aspiration at the hub of the [facility] guide catheter and at the hub of the 5 [place] Catalyst guide catheter at its hub, the combination of the retrieval device, and the microcatheter and the Catalyst guide catheter were retrieved and removed. Again flow arrest was relieved with free and back bleed of blood at the hub of the [facility] guide catheter. At this time, chunks of clot were noted in the aspirate, and also in the [place] of the [facility] guide catheter.  A control arteriogram performed through the 8 [place] FlowGate guide catheter in the right internal carotid artery demonstrated complete angiographic revascularization of the right internal carotid artery in the entirety, the right posterior cerebral artery, the anterior choroidal artery, the right middle cerebral artery and the right anterior cerebral artery into the capillary and venous phases. There was moderate spasm in the right middle cerebral artery proximally which responded to 2 aliquots of 25 mcg of nitroglycerin intra-arterially. Revascularization had been established. Throughout the procedure, the patient's blood pressure  and neurological status remained stable. No angiographic evidence of extravasation or of mass effect or midline shift was noted. The [facility] guide catheter, and the 8 French neurovascular sheath were then retrieved and removed, and exchanged for an 8 [place] Pinnacle sheath over a J-tip guidewire. This was removed with manual compression being held for hemostasis. The right groin appeared soft without evidence of hematoma or bleeding. Distal pulses remained Dopplerable in the dorsalis pedis, and posterior tibial regions at the end of the procedure unchanged. The Dyna CT of the brain performed right after the procedure demonstrated no evidence of hemorrhage, or of mass effect or midline shift. The patient was then transported to the neuro ICU intubated for further post stroke management. IMPRESSION: Status post endovascular complete revascularization of occluded right internal carotid artery terminus, the right middle cerebral artery and right anterior cerebral artery with 2 passes with the 5 mm x 33 mm Embotrap retrieval device achieving a TICI 3 revascularization. PLAN: Follow-up in the clinic 4 weeks post discharge Electronically Signed   By: Luanne Bras M.D.   On: 03/17/2018 13:40   Ct Head Code Stroke Wo Contrast  Result Date: 04/01/2018 CLINICAL DATA:  Code stroke.  Left-sided weakness and slurred speech. EXAM: CT HEAD WITHOUT CONTRAST TECHNIQUE: Contiguous axial images were obtained from the base of the skull through the vertex without intravenous contrast. COMPARISON:  None. FINDINGS: Brain: Mild atrophy and white matter changes are within normal limits for age. No acute hemorrhage or mass lesion is present. There is decreased density of the right lentiform nucleus and insular ribbon. M1 and M2 gray-white differentiation is decreased. Super ganglionic cortex is within normal limits. Ventricles are of normal size. No significant extraaxial fluid collection is present. Remote lacunar infarcts are present in the right cerebellum. Brainstem is within normal limits. Vascular: A hyperdense right MCA is present. Atherosclerotic calcifications are present bilaterally. Skull: Calvarium is intact. No focal lytic or blastic lesions are present. Sinuses/Orbits: The paranasal sinuses and mastoid air cells are clear. Bilateral lens replacements are noted. Globes and orbits are otherwise unremarkable. ASPECTS [facility] Stroke Program Early CT Score) - Ganglionic level infarction (caudate, lentiform nuclei, internal capsule, insula, M1-M3 cortex): 3/7 - Supraganglionic infarction (M4-M6 cortex): 3/3 Total score (0-10 with 10 being normal): 6/10 IMPRESSION: 1. Acute right MCA territory infarct with decreased density in the right lentiform nucleus, right insular cortex, and right M1 and M2 cortex. 2. Hyperdense right MCA suggesting acute thrombus. 3. ASPECTS is 6/10 The above was relayed via text pager to Dr. Roland Rack on 03/18/2018 at 20:55 . Electronically Signed   By: San Morelle M.D.   On: 03/11/2018 20:57    Microbiology Recent Results (from the past 240 hour(s))  MRSA PCR Screening     Status: None   Collection Time: 03/13/2018 11:45 PM  Result Value Ref Range Status   MRSA by PCR NEGATIVE NEGATIVE Final    Comment:        The GeneXpert MRSA Assay (FDA approved  for NASAL specimens only), is one component of a comprehensive MRSA colonization surveillance program. It is not intended to diagnose MRSA infection nor to guide or monitor treatment for MRSA infections. Performed at [facility], [place] [address] [place], [place] [address]   Culture, respiratory (non-expectorated)     Status: None   Collection Time: 03/15/18 11:48 AM  Result Value Ref Range Status   Specimen Description TRACHEAL ASPIRATE  Final   Special Requests NONE  Final  Gram Stain NO WBC SEEN NO ORGANISMS SEEN   Final   Culture   Final    FEW Consistent with normal respiratory flora. Performed at [facility], [place] [address], [place], [place] [address]    Report Status 03/17/2018 FINAL  Final    Lab Basic Metabolic Panel: Recent Labs  Lab 03/17/18 0749 03/18/18 0529 03/18/18 1543 03/19/18 0349 03/20/18 0544  NA 138 140 143 144 146*  K 4.4 4.3 4.1 4.5 4.3  CL 109 113* 117* 112* 115*  CO2 16* 16* 16* 18* 22  GLUCOSE 124* 154* 170* 150* 143*  BUN 27* 35* 40* 43* 44*  CREATININE 0.95 1.05* 1.15* 1.18* 1.05*  CALCIUM 9.1 9.6 9.6 9.5 9.3  MG 2.4 2.6* 2.6* 2.4 2.1  PHOS  --  3.3  --  2.9 3.2   Liver Function Tests: No results for input(s): AST, ALT, ALKPHOS, BILITOT, PROT, ALBUMIN in the last 168 hours. No results for input(s): LIPASE, AMYLASE in the last 168 hours. Recent Labs  Lab 03/20/18 0921  AMMONIA 22   CBC: Recent Labs  Lab 03/17/18 0749 03/18/18 0529 03/19/18 0349 03/20/18 0544  WBC 22.0* 19.3* 16.1* 17.7*  HGB 10.8* 10.3* 9.4* 9.0*  HCT 35.4* 33.4* 32.1* 31.0*  MCV 86.3 85.6 86.5 88.1  PLT 268 312 301 285   Cardiac Enzymes: No results for input(s): CKTOTAL, CKMB, CKMBINDEX, TROPONINI in the last 168 hours. Sepsis Labs: Recent Labs  Lab 03/17/18 0749 03/18/18 0529 03/19/18 0349 03/20/18 0544  WBC 22.0* 19.3* 16.1* 17.7*     Selicia Windom Mar 31, 2018, 8:25 AM

## 2018-04-05 DEATH — deceased

## 2018-07-18 ENCOUNTER — Other Ambulatory Visit: Payer: Self-pay

## 2020-02-22 IMAGING — CR DG CHEST 2V
2 series · 2 of 2 positions shown · non-contrast
Comparison: 03/03/2016

CLINICAL DATA: Chest pressure and shortness of breath on exertion.

EXAM:
CHEST - 2 VIEW

[chest pa]
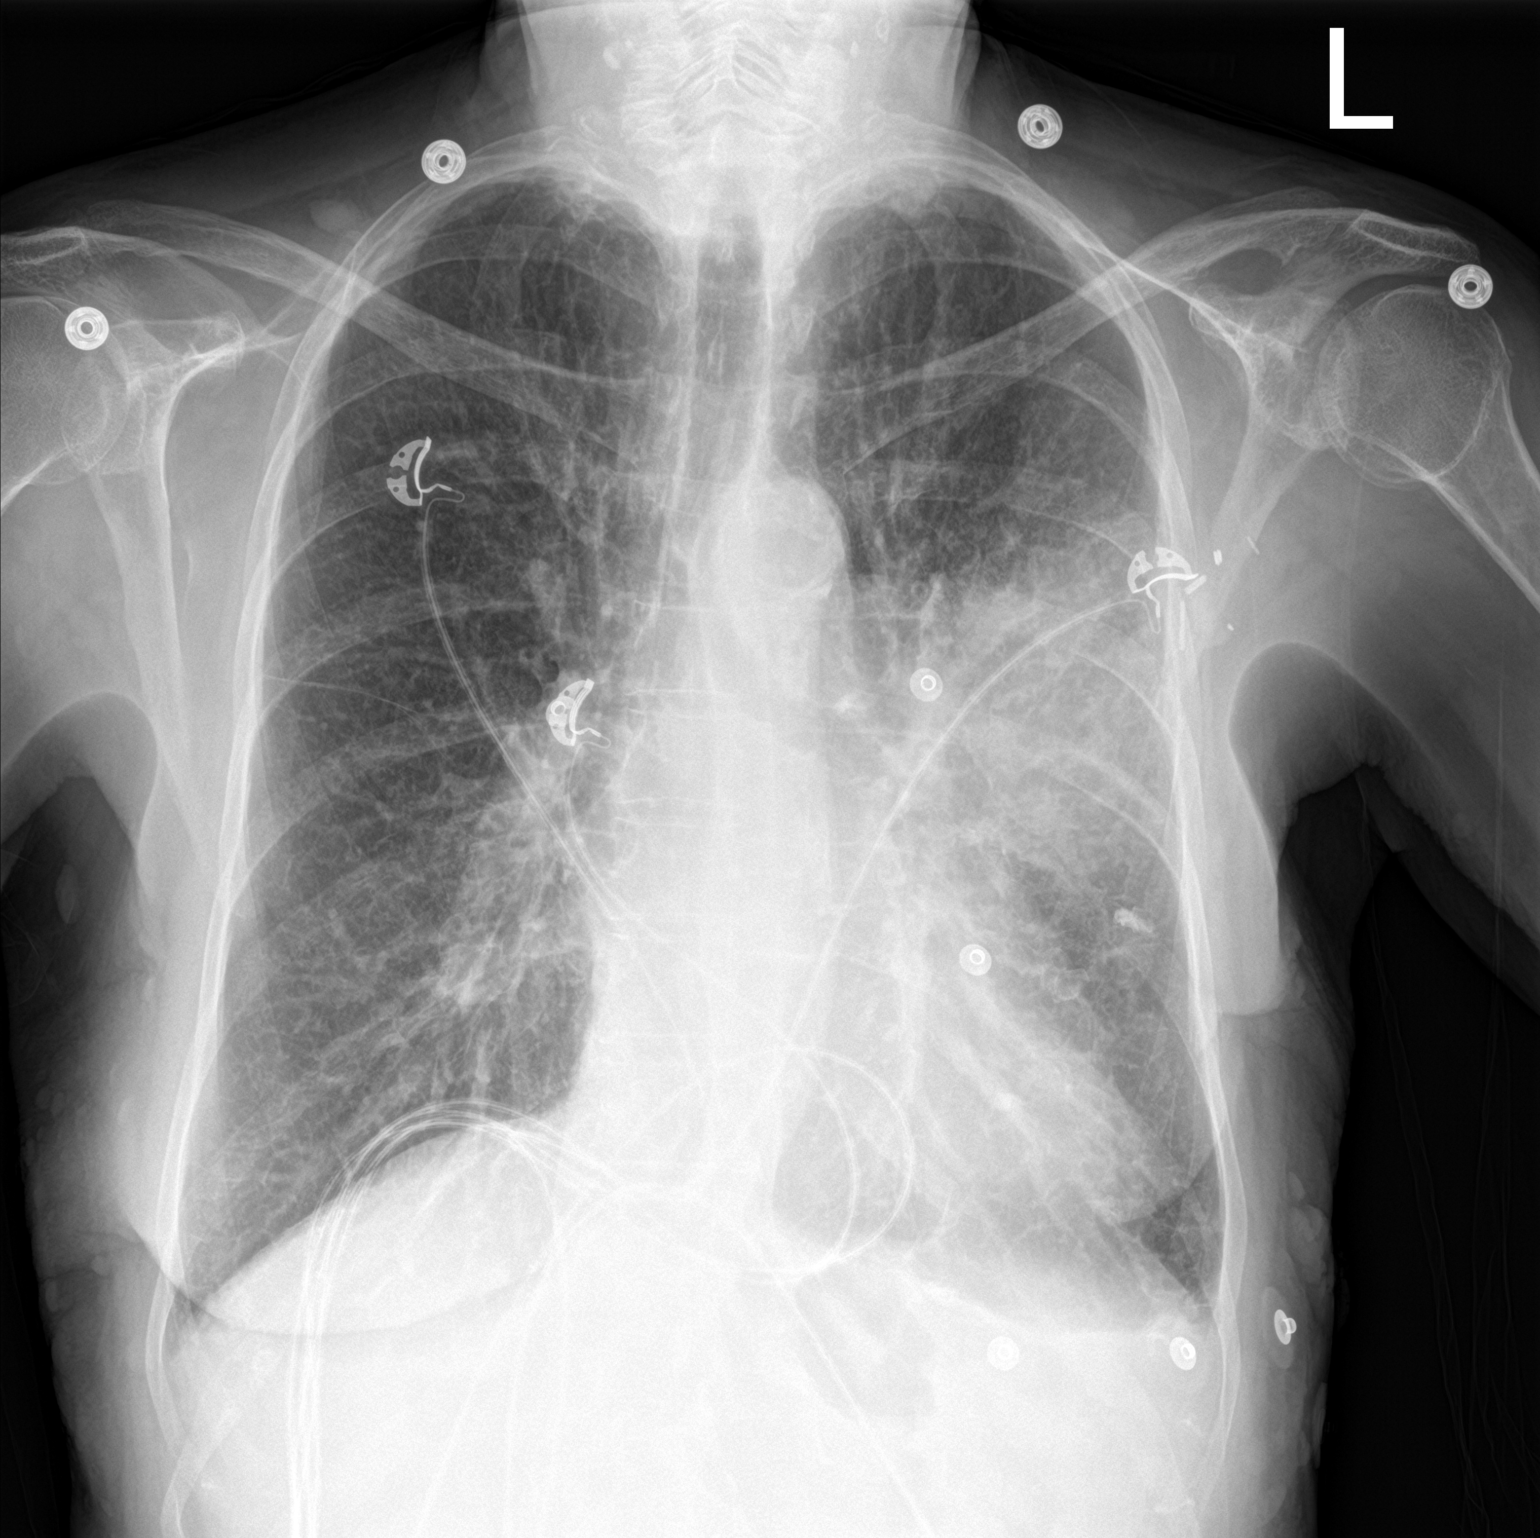

[chest lat]
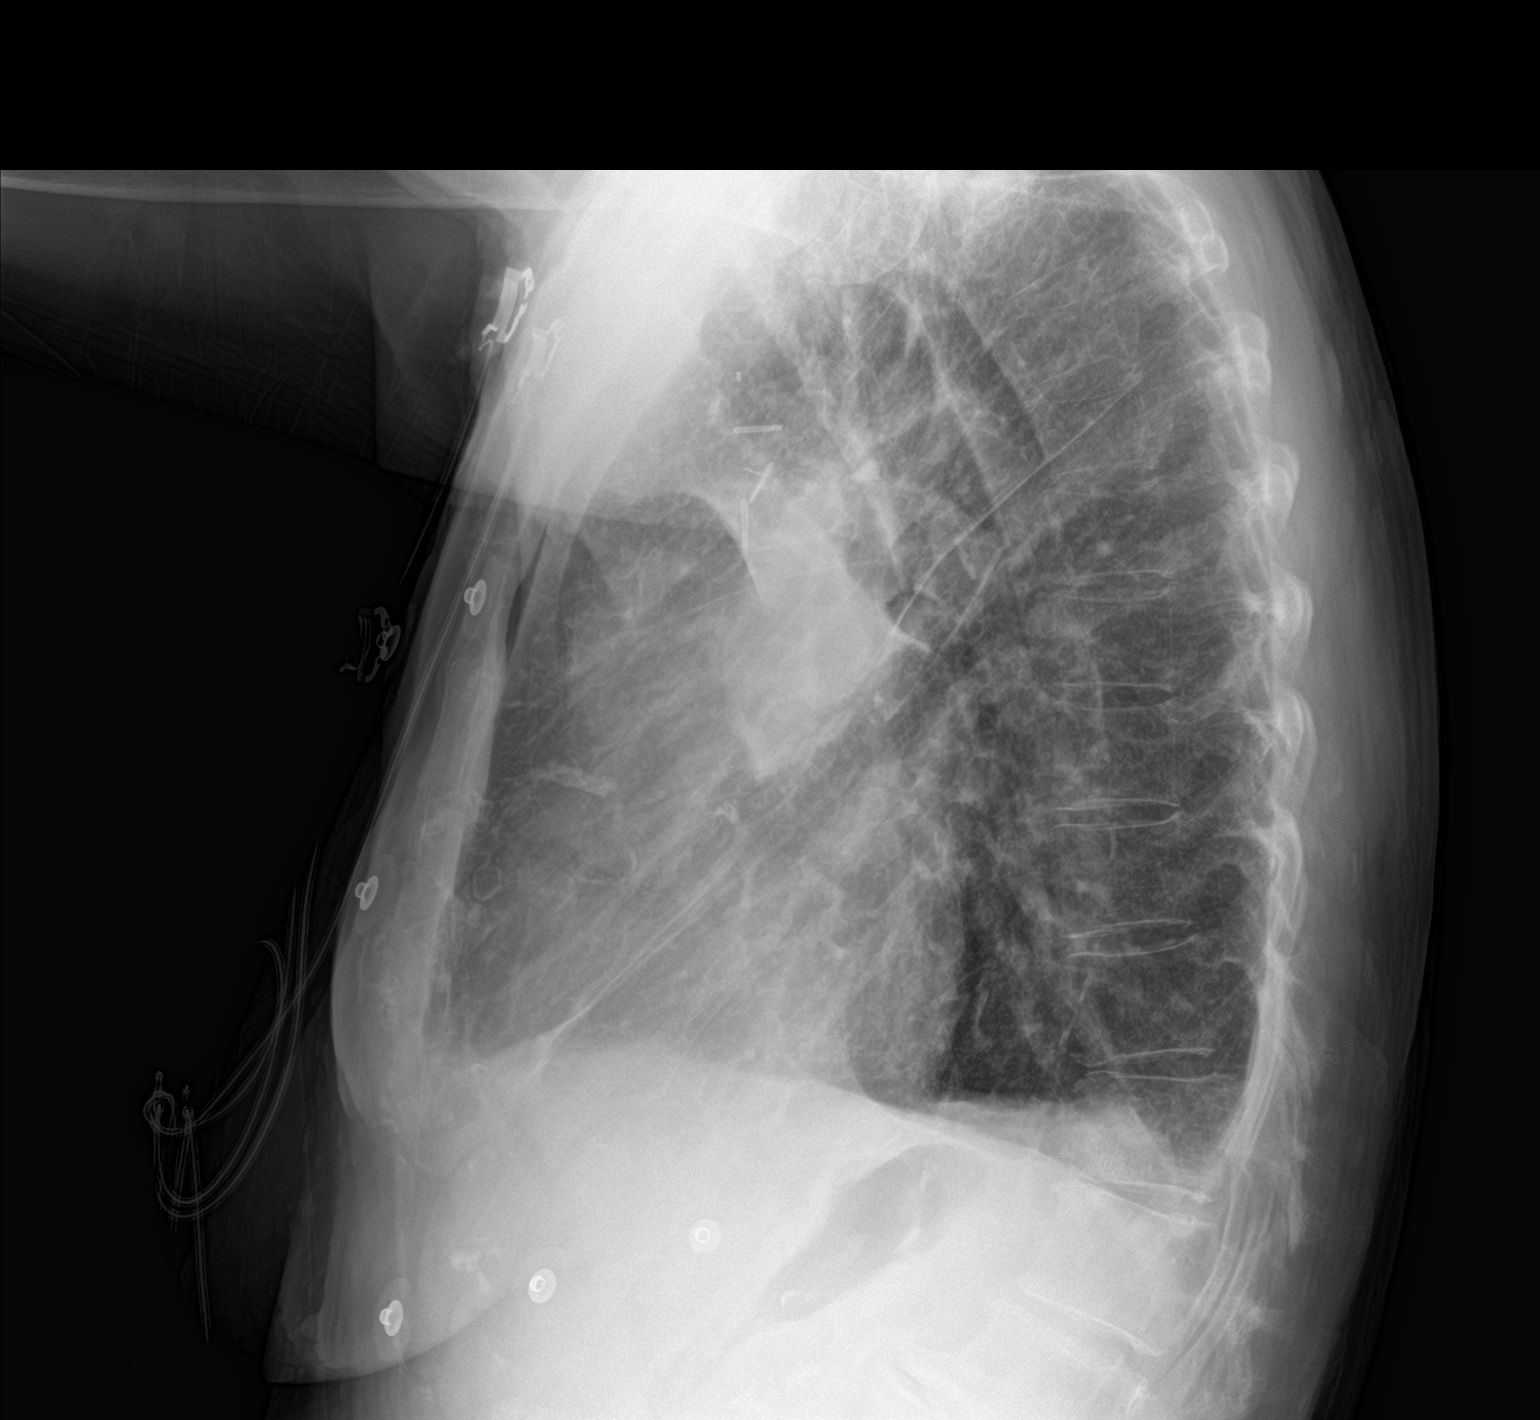

[2 of 2 positions shown; findings below may reference images not displayed]

FINDINGS: The heart size appears within normal limits. Aortic atherosclerosis.
Masslike consolidation within the left upper lobe is identified.
This is new when compared with the previous exam. Mild diffuse edema
and small left pleural effusion.
IMPRESSION: 1. Masslike consolidation within the left upper lobe is identified.
In the acute setting is favored to represent pneumonia. In the
absence of signs or symptoms of infection consider CT of the chest
with contrast material to assess for underlying malignancy in this
patient who has a history of smoking and may be at increased risk
for lung cancer.
2. Small left pleural effusion and edema.

## 2020-02-25 IMAGING — DX DG CHEST 1V PORT
1 series · 2 of 2 positions shown · non-contrast
Comparison: 03/10/2018

CLINICAL DATA: Shortness of breath

EXAM:
PORTABLE CHEST 1 VIEW

[Series 1: chest · 0.14mm/px · 2 of 2 slices shown]
[im 1/2]
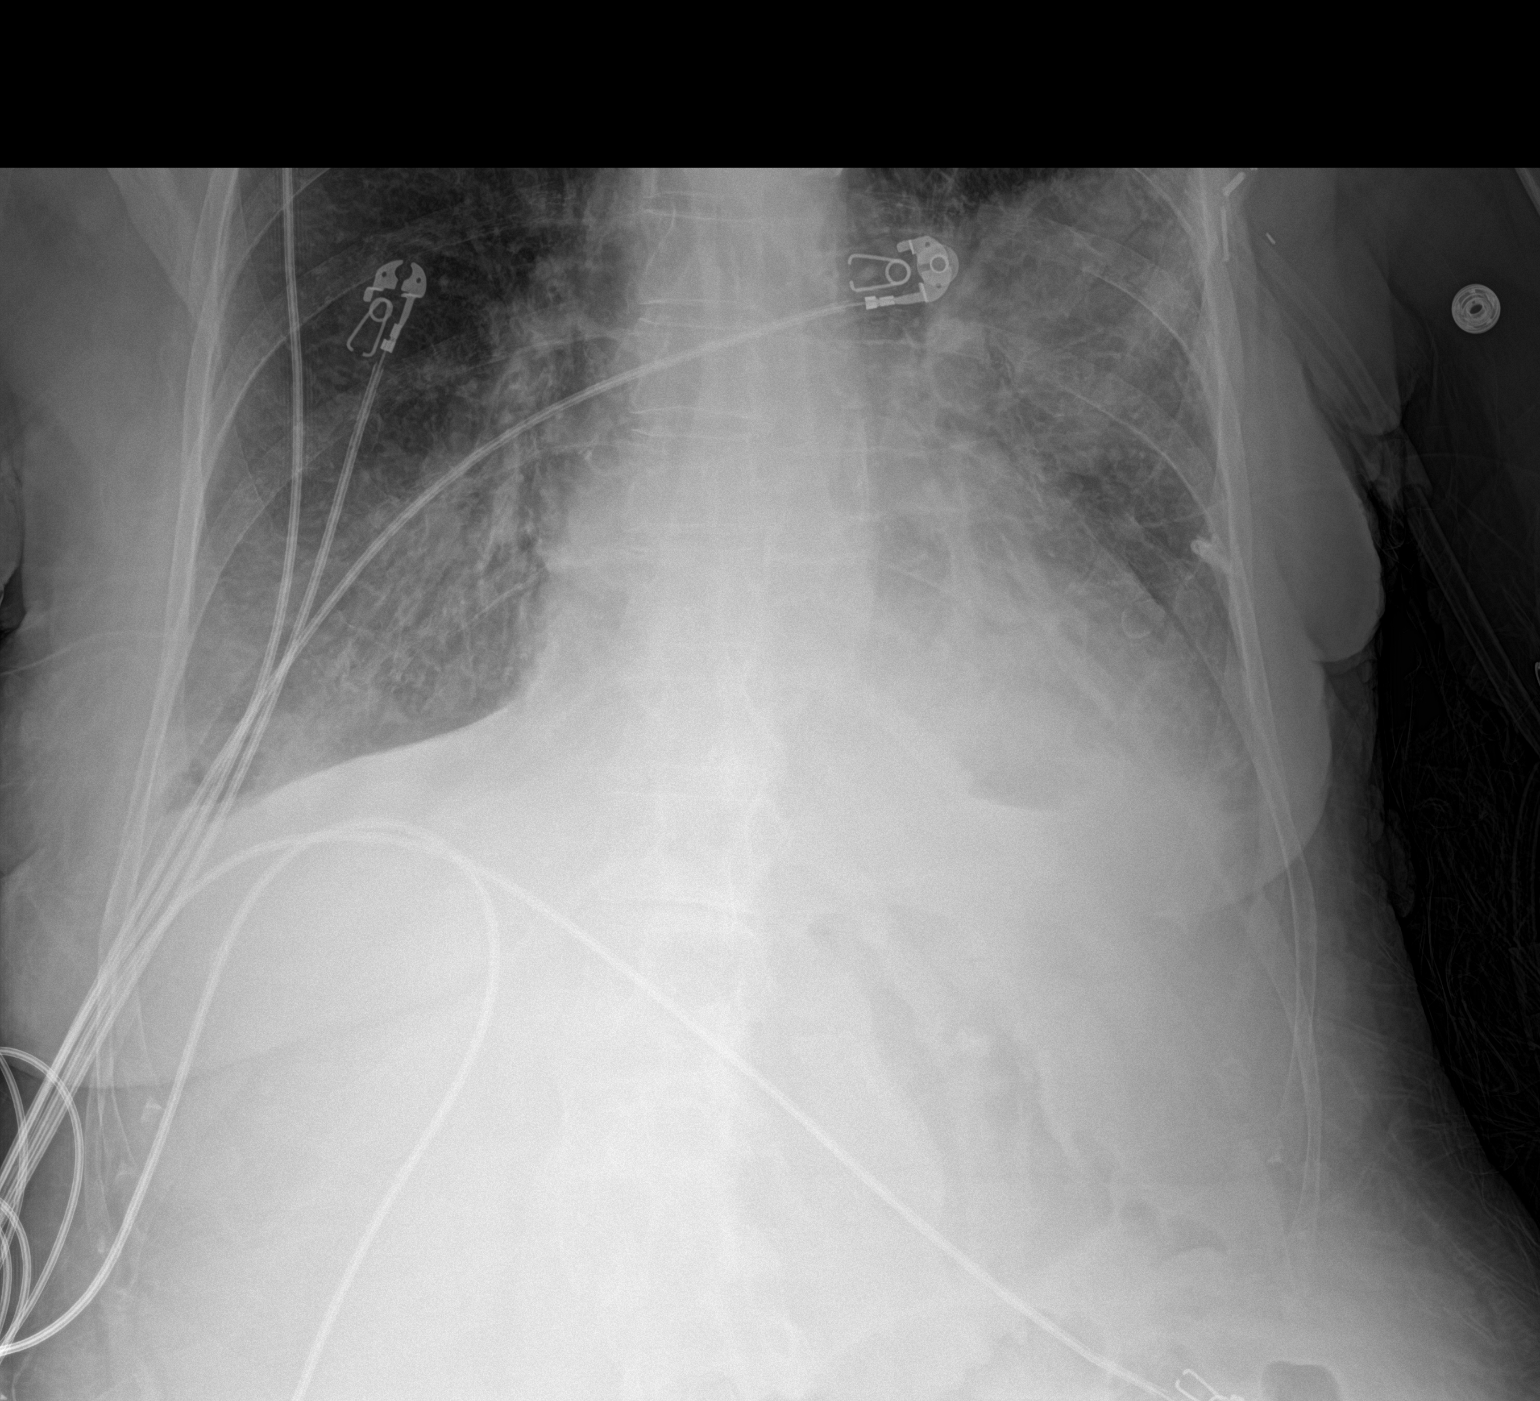
[im 2/2]
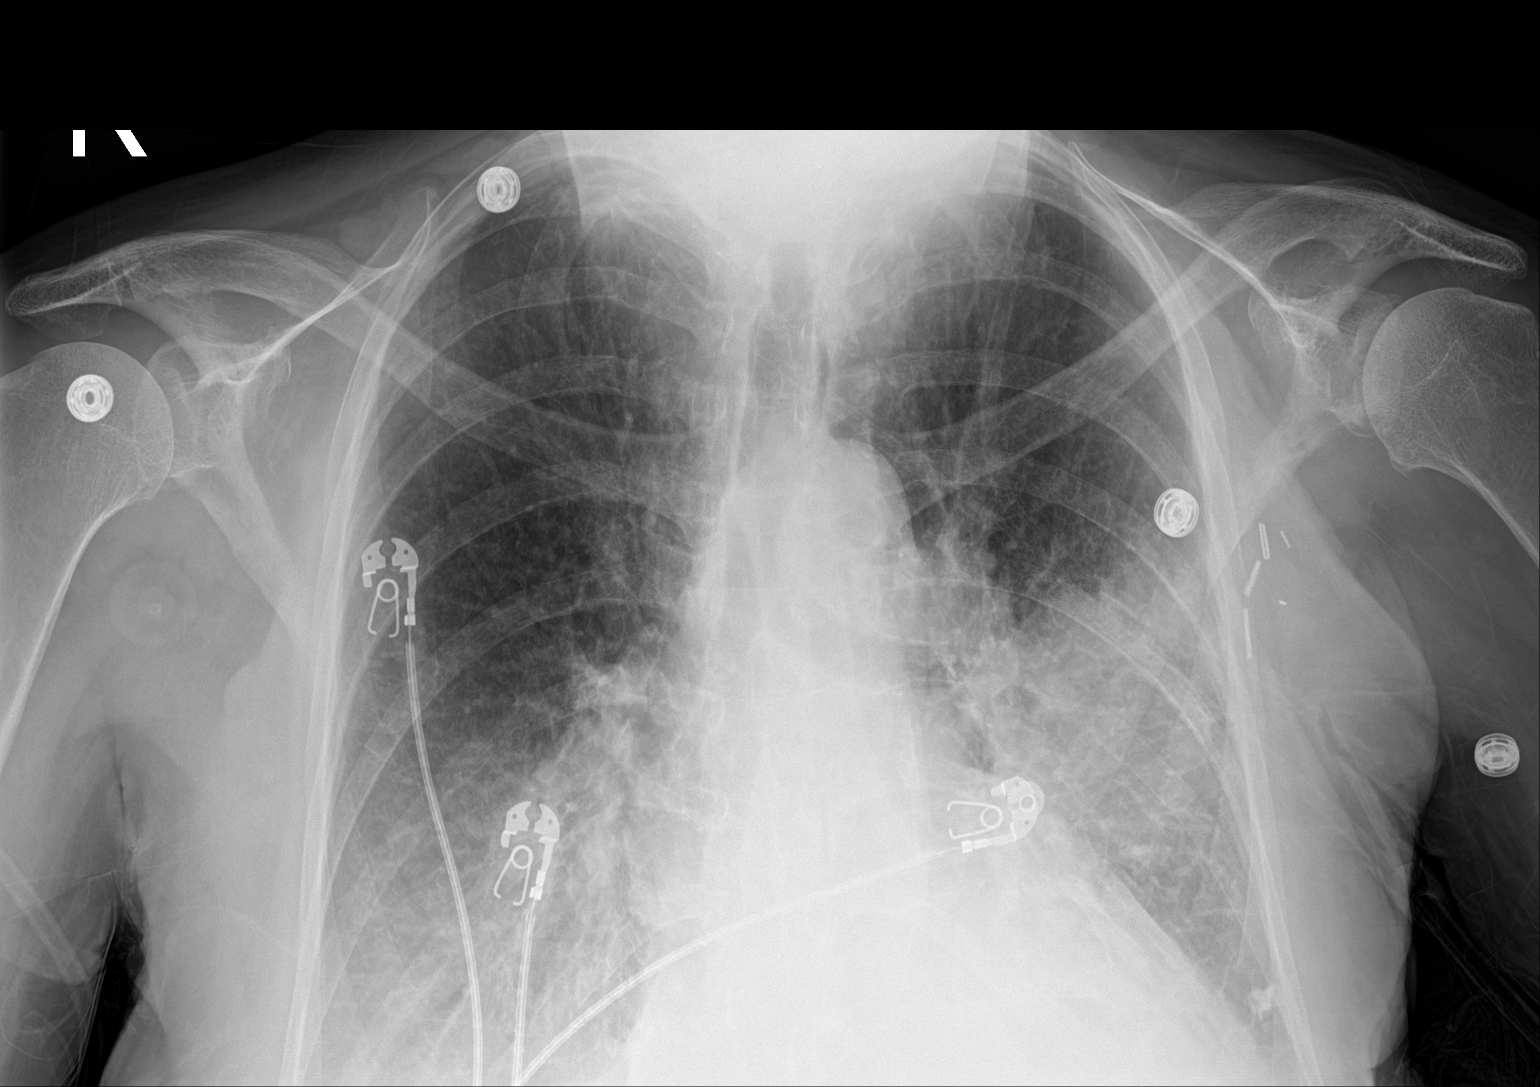

[2 of 2 positions shown; findings below may reference images not displayed]

FINDINGS: Cardiac shadow remains enlarged. Aortic calcifications are again
seen. Rounded opacity is again noted in the left mid lung similar to
that seen on prior plain film and CT examination. No significant
improvement is noted. Improved aeration in the right lung is noted.
No bony abnormality is seen.
IMPRESSION: Improved aeration in the right lung base.

Stable masslike infiltrate in the left upper lobe

## 2020-02-28 IMAGING — DX DG CHEST 1V PORT
1 series · 2 of 2 positions shown · non-contrast
Comparison: 03/11/2018

CLINICAL DATA: Intubation

EXAM:
PORTABLE CHEST 1 VIEW

[Series 1: chest · 0.14mm/px · 2 of 2 slices shown]
[im 1/2]
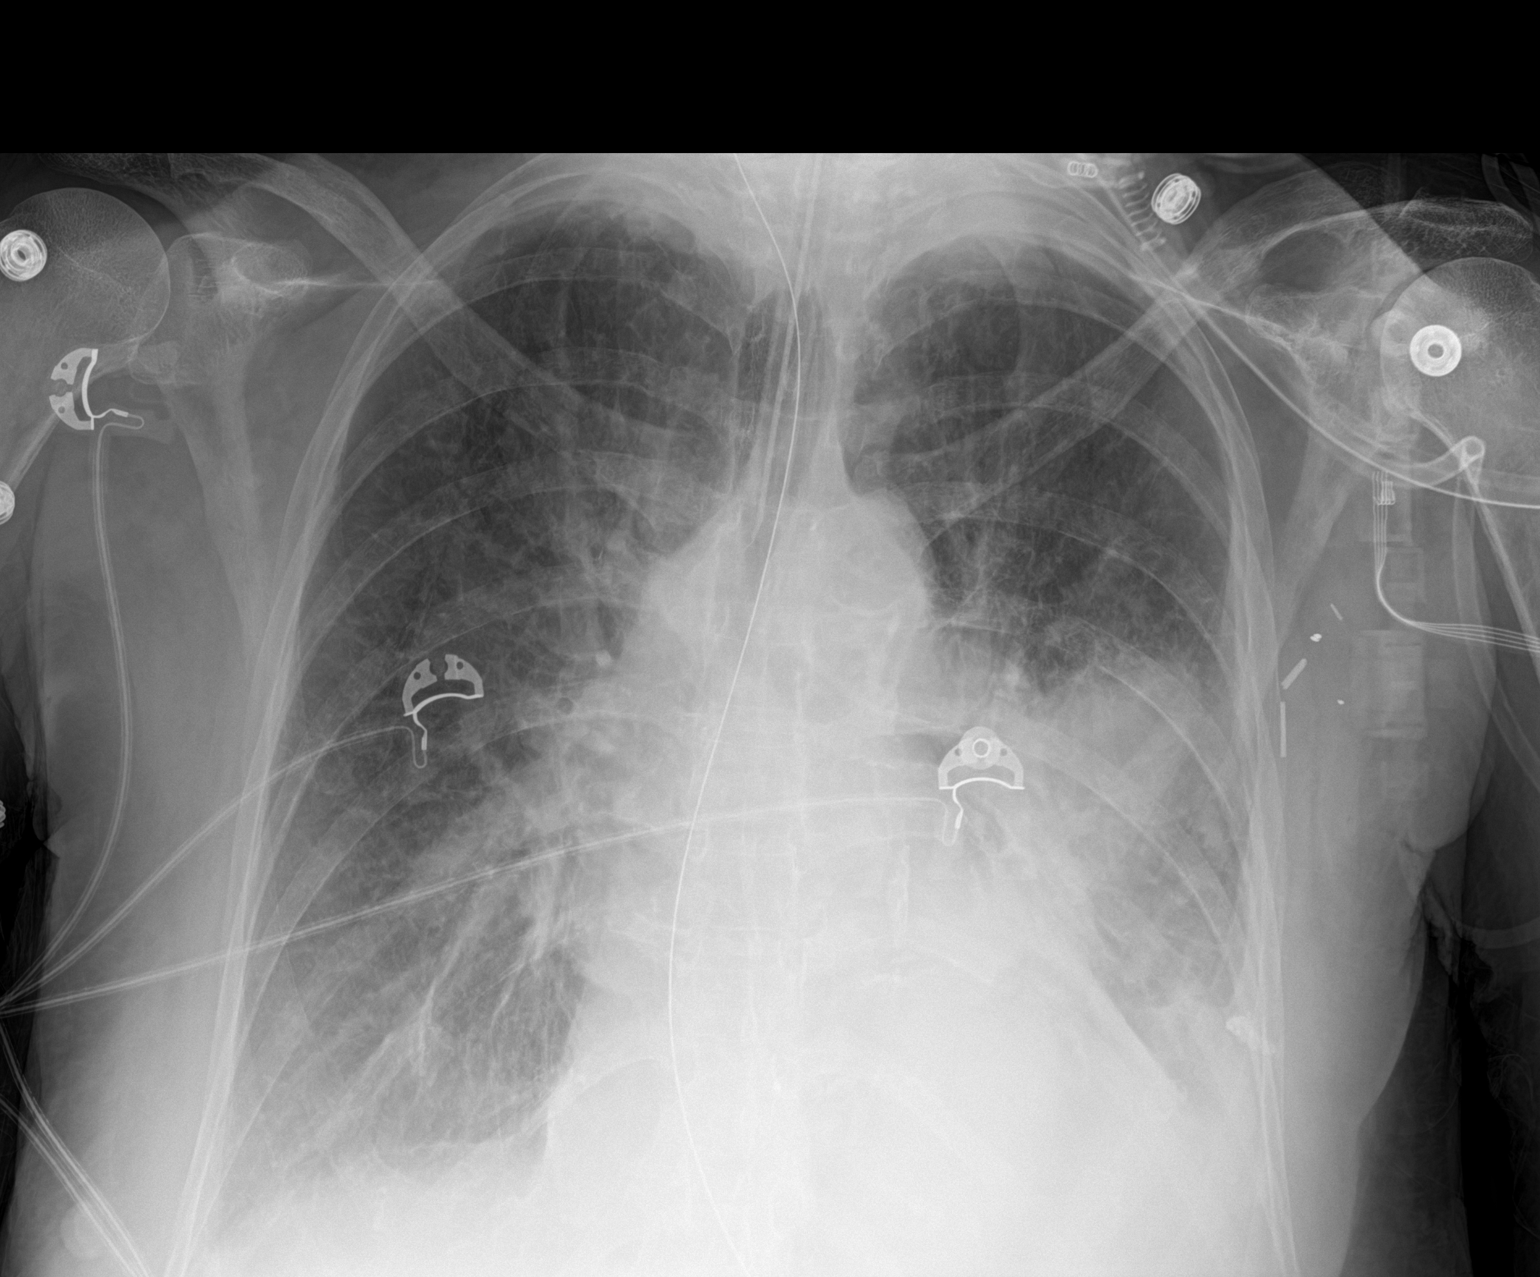
[im 2/2]
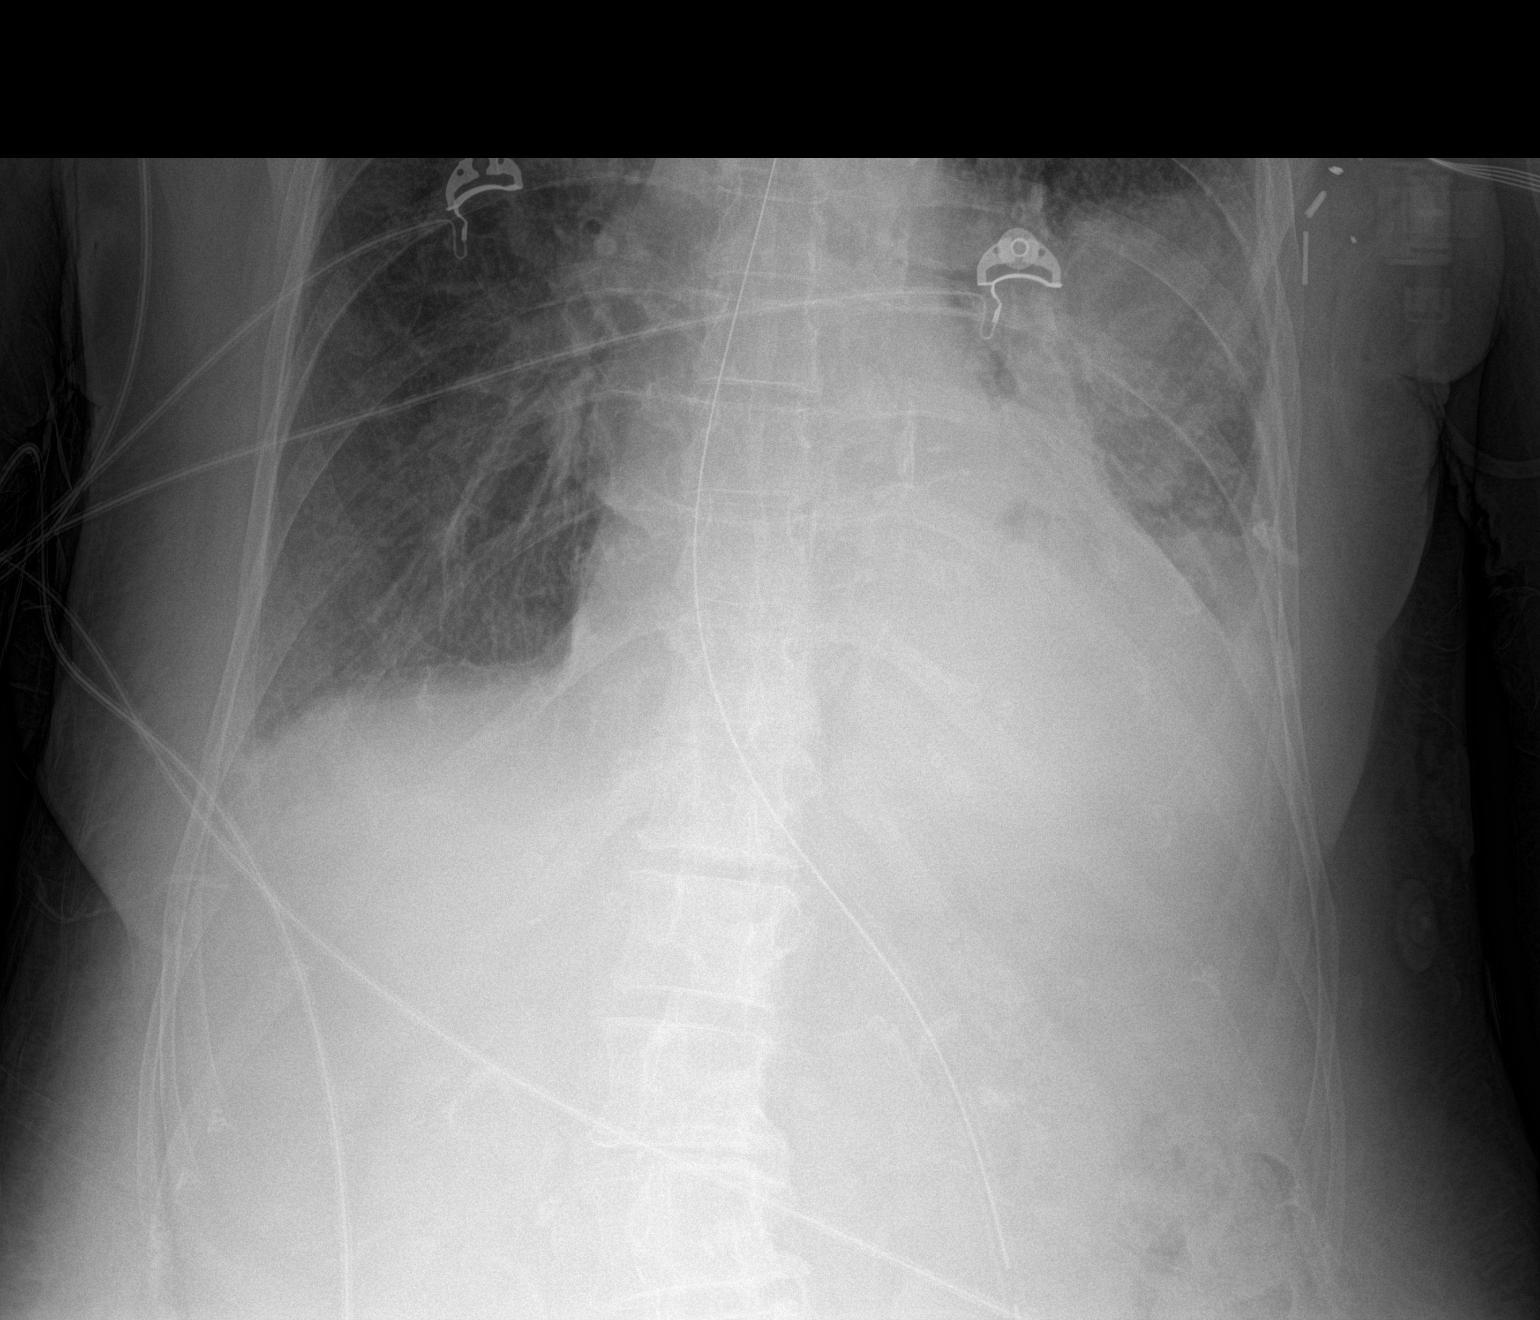

[2 of 2 positions shown; findings below may reference images not displayed]

FINDINGS: Endotracheal tube placed with tip measuring 3.3 cm above the carina.
Enteric tube placed. Tip is off the field of view but below the left
hemidiaphragm. Cardiac enlargement. No vascular congestion. Left
perihilar mass or consolidation with consolidation or atelectasis in
the left lower lung as well. Similar appearance to previous study,
lying for differences in technique. No pneumothorax. Calcification
of the aorta.
IMPRESSION: Appliances appear in satisfactory location. Cardiac enlargement.
Left perihilar mass or consolidation with consolidation or
atelectasis in the left lower lung, unchanged.

## 2020-02-29 IMAGING — DX DG CHEST 1V PORT
1 series · 1 of 1 positions shown · non-contrast
Comparison: 03/14/2018 and older exams.

CLINICAL DATA: Follow-up CHF.

EXAM:
PORTABLE CHEST 1 VIEW

[chest]
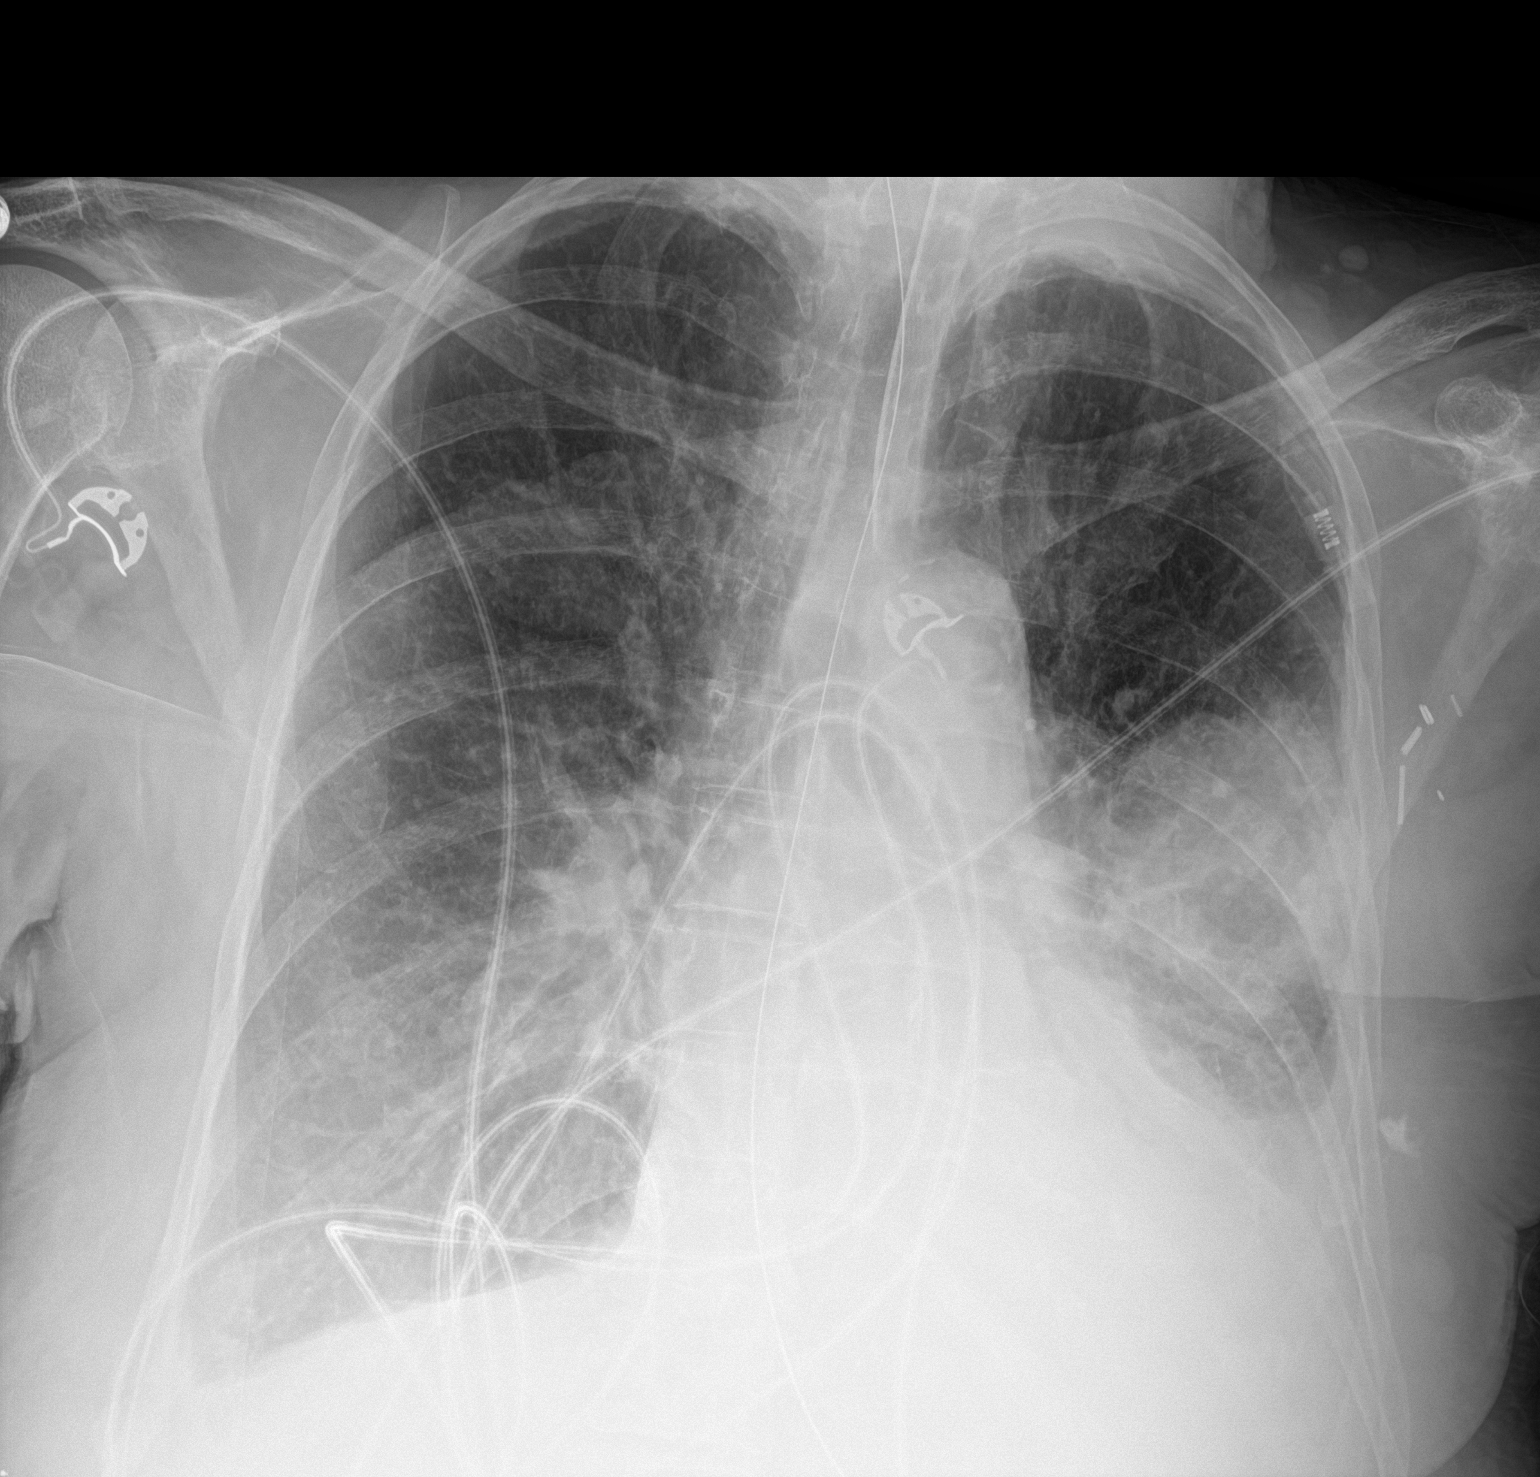

[1 of 1 positions shown; findings below may reference images not displayed]

FINDINGS: Masslike area of consolidation in the left mid lung is stable.

There are stable prominent bronchovascular and interstitial markings
bilaterally. There is confluent opacity at the left lung base,
unchanged. Small left and probable small right pleural effusions. No
pneumothorax.

Endotracheal tube tip projects 3.3 cm above the Carina.
Nasal/orogastric tube passes below the diaphragm into the stomach.
IMPRESSION: 1. Similar appearance to the previous day's study.
2. Masslike opacity in the left mid lung. Left lung base opacity
most likely atelectasis although pneumonia is possible. Small left
and probable small right pleural effusions. No overt pulmonary
edema. There are chronically prominent bronchovascular and
interstitial markings.
